# Patient Record
Sex: Male | Born: 1955 | Race: White | Hispanic: No | Marital: Married | Smoking: Never smoker
Health system: Southern US, Community
[De-identification: ages and names within clinical notes are randomized; demographics above are authoritative.]

## PROBLEM LIST (undated history)

## (undated) DIAGNOSIS — R4182 Altered mental status, unspecified: Secondary | ICD-10-CM

## (undated) DIAGNOSIS — R931 Abnormal findings on diagnostic imaging of heart and coronary circulation: Secondary | ICD-10-CM

## (undated) DIAGNOSIS — M5136 Other intervertebral disc degeneration, lumbar region: Secondary | ICD-10-CM

## (undated) DIAGNOSIS — Z79899 Other long term (current) drug therapy: Secondary | ICD-10-CM

## (undated) DIAGNOSIS — E782 Mixed hyperlipidemia: Secondary | ICD-10-CM

## (undated) DIAGNOSIS — F330 Major depressive disorder, recurrent, mild: Secondary | ICD-10-CM

## (undated) DIAGNOSIS — R251 Tremor, unspecified: Secondary | ICD-10-CM

## (undated) DIAGNOSIS — I4891 Unspecified atrial fibrillation: Secondary | ICD-10-CM

## (undated) DIAGNOSIS — S4991XA Unspecified injury of right shoulder and upper arm, initial encounter: Secondary | ICD-10-CM

## (undated) DIAGNOSIS — M75101 Unspecified rotator cuff tear or rupture of right shoulder, not specified as traumatic: Secondary | ICD-10-CM

## (undated) DIAGNOSIS — R42 Dizziness and giddiness: Secondary | ICD-10-CM

## (undated) DIAGNOSIS — H9190 Unspecified hearing loss, unspecified ear: Secondary | ICD-10-CM

## (undated) DIAGNOSIS — E538 Deficiency of other specified B group vitamins: Secondary | ICD-10-CM

## (undated) DIAGNOSIS — R9082 White matter disease, unspecified: Secondary | ICD-10-CM

## (undated) DIAGNOSIS — I429 Cardiomyopathy, unspecified: Secondary | ICD-10-CM

## (undated) DIAGNOSIS — R7303 Prediabetes: Secondary | ICD-10-CM

## (undated) DIAGNOSIS — Z7982 Long term (current) use of aspirin: Secondary | ICD-10-CM

## (undated) DIAGNOSIS — R5381 Other malaise: Secondary | ICD-10-CM

## (undated) HISTORY — DX: Abnormal findings on diagnostic imaging of heart and coronary circulation: R93.1

## (undated) HISTORY — PX: BACK SURGERY: SHX140

## (undated) HISTORY — PX: CHOLECYSTECTOMY: SHX55

---
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
## 1996-06-06 HISTORY — PX: SPINAL FUSION: SHX223

## 1999-06-07 HISTORY — PX: SPINE SURGERY: SHX786

## 1999-12-01 ENCOUNTER — Ambulatory Visit (HOSPITAL_COMMUNITY): Admission: RE | Admit: 1999-12-01 | Discharge: 1999-12-01 | Payer: Self-pay | Admitting: Specialist

## 1999-12-01 ENCOUNTER — Encounter: Payer: Self-pay | Admitting: Specialist

## 2007-06-07 HISTORY — PX: HERNIA REPAIR: SHX51

## 2010-12-05 HISTORY — PX: SHOULDER SURGERY: SHX246

## 2014-11-04 HISTORY — PX: ESOPHAGOGASTRODUODENOSCOPY: SHX1529

## 2015-07-30 DIAGNOSIS — M5136 Other intervertebral disc degeneration, lumbar region: Secondary | ICD-10-CM

## 2015-07-30 DIAGNOSIS — M51369 Other intervertebral disc degeneration, lumbar region without mention of lumbar back pain or lower extremity pain: Secondary | ICD-10-CM

## 2015-07-30 DIAGNOSIS — Z79899 Other long term (current) drug therapy: Secondary | ICD-10-CM

## 2015-07-30 DIAGNOSIS — E538 Deficiency of other specified B group vitamins: Secondary | ICD-10-CM

## 2015-07-30 DIAGNOSIS — F330 Major depressive disorder, recurrent, mild: Secondary | ICD-10-CM

## 2015-07-30 DIAGNOSIS — R251 Tremor, unspecified: Secondary | ICD-10-CM

## 2015-07-30 DIAGNOSIS — R7303 Prediabetes: Secondary | ICD-10-CM

## 2015-07-30 DIAGNOSIS — E785 Hyperlipidemia, unspecified: Secondary | ICD-10-CM

## 2015-07-30 DIAGNOSIS — E782 Mixed hyperlipidemia: Secondary | ICD-10-CM

## 2015-07-30 HISTORY — DX: Other intervertebral disc degeneration, lumbar region without mention of lumbar back pain or lower extremity pain: M51.369

## 2015-07-30 HISTORY — DX: Other long term (current) drug therapy: Z79.899

## 2015-07-30 HISTORY — DX: Major depressive disorder, recurrent, mild: F33.0

## 2015-07-30 HISTORY — DX: Prediabetes: R73.03

## 2015-07-30 HISTORY — DX: Deficiency of other specified B group vitamins: E53.8

## 2015-07-30 HISTORY — DX: Other intervertebral disc degeneration, lumbar region: M51.36

## 2015-07-30 HISTORY — DX: Mixed hyperlipidemia: E78.2

## 2015-07-30 HISTORY — DX: Hyperlipidemia, unspecified: E78.5

## 2015-07-30 HISTORY — DX: Tremor, unspecified: R25.1

## 2016-05-26 HISTORY — PX: COLONOSCOPY: SHX174

## 2018-10-08 DIAGNOSIS — I4821 Permanent atrial fibrillation: Secondary | ICD-10-CM

## 2018-10-08 DIAGNOSIS — S4991XA Unspecified injury of right shoulder and upper arm, initial encounter: Secondary | ICD-10-CM

## 2018-10-08 DIAGNOSIS — I4891 Unspecified atrial fibrillation: Secondary | ICD-10-CM

## 2018-10-08 HISTORY — DX: Unspecified atrial fibrillation: I48.91

## 2018-10-08 HISTORY — DX: Unspecified injury of right shoulder and upper arm, initial encounter: S49.91XA

## 2018-10-08 HISTORY — DX: Permanent atrial fibrillation: I48.21

## 2018-10-15 DIAGNOSIS — Z7982 Long term (current) use of aspirin: Secondary | ICD-10-CM | POA: Insufficient documentation

## 2018-10-15 DIAGNOSIS — H9190 Unspecified hearing loss, unspecified ear: Secondary | ICD-10-CM

## 2018-10-15 HISTORY — DX: Unspecified hearing loss, unspecified ear: H91.90

## 2018-10-15 HISTORY — DX: Long term (current) use of aspirin: Z79.82

## 2018-10-16 DIAGNOSIS — R0600 Dyspnea, unspecified: Secondary | ICD-10-CM

## 2018-10-16 DIAGNOSIS — R0609 Other forms of dyspnea: Secondary | ICD-10-CM

## 2018-10-16 HISTORY — DX: Other forms of dyspnea: R06.09

## 2018-10-16 HISTORY — DX: Dyspnea, unspecified: R06.00

## 2018-10-25 DIAGNOSIS — R4182 Altered mental status, unspecified: Secondary | ICD-10-CM

## 2018-10-25 DIAGNOSIS — R5381 Other malaise: Secondary | ICD-10-CM | POA: Insufficient documentation

## 2018-10-25 DIAGNOSIS — I429 Cardiomyopathy, unspecified: Secondary | ICD-10-CM | POA: Insufficient documentation

## 2018-10-25 DIAGNOSIS — R5383 Other fatigue: Secondary | ICD-10-CM

## 2018-10-25 DIAGNOSIS — R55 Syncope and collapse: Secondary | ICD-10-CM | POA: Insufficient documentation

## 2018-10-25 DIAGNOSIS — M75101 Unspecified rotator cuff tear or rupture of right shoulder, not specified as traumatic: Secondary | ICD-10-CM

## 2018-10-25 DIAGNOSIS — R42 Dizziness and giddiness: Secondary | ICD-10-CM | POA: Insufficient documentation

## 2018-10-25 HISTORY — DX: Unspecified rotator cuff tear or rupture of right shoulder, not specified as traumatic: M75.101

## 2018-10-25 HISTORY — DX: Cardiomyopathy, unspecified: I42.9

## 2018-10-25 HISTORY — DX: Syncope and collapse: R42

## 2018-10-25 HISTORY — DX: Other malaise: R53.83

## 2018-10-25 HISTORY — DX: Altered mental status, unspecified: R41.82

## 2018-10-25 HISTORY — DX: Other malaise: R53.81

## 2018-11-19 DIAGNOSIS — R9082 White matter disease, unspecified: Secondary | ICD-10-CM

## 2018-11-19 HISTORY — DX: White matter disease, unspecified: R90.82

## 2018-11-22 ENCOUNTER — Encounter: Payer: Self-pay | Admitting: *Deleted

## 2018-11-22 ENCOUNTER — Other Ambulatory Visit: Payer: Self-pay | Admitting: *Deleted

## 2018-11-23 ENCOUNTER — Other Ambulatory Visit: Payer: Self-pay

## 2018-11-23 ENCOUNTER — Ambulatory Visit (INDEPENDENT_AMBULATORY_CARE_PROVIDER_SITE_OTHER): Payer: Medicare HMO | Admitting: Cardiology

## 2018-11-23 ENCOUNTER — Encounter: Payer: Self-pay | Admitting: Cardiology

## 2018-11-23 VITALS — BP 110/68 | HR 70 | Ht 72.0 in | Wt 215.6 lb

## 2018-11-23 DIAGNOSIS — E782 Mixed hyperlipidemia: Secondary | ICD-10-CM

## 2018-11-23 DIAGNOSIS — I4891 Unspecified atrial fibrillation: Secondary | ICD-10-CM

## 2018-11-23 DIAGNOSIS — R7303 Prediabetes: Secondary | ICD-10-CM | POA: Diagnosis not present

## 2018-11-23 DIAGNOSIS — I420 Dilated cardiomyopathy: Secondary | ICD-10-CM | POA: Diagnosis not present

## 2018-11-23 NOTE — Progress Notes (Signed)
Cardiology Consultation:    Date:  11/23/2018   ID:  Richard Spence, DOB Oct 08, 1955, MRN [id number]  PCP:  [name], MD  Cardiologist:  [name], MD   Referring MD: No ref. provider found   Chief Complaint  Patient presents with  . Atrial Fibrillation  I have atrial fibrillation  History of Present Illness:    Richard Spence is a 63 y.o. male who is being seen today for the evaluation of atrial fibrillation at the request of No ref. provider found.  Few weeks ago he went to his primary care physician for regular physical.  He was found to have irregular heart rate EKG was done and confirmed presence of atrial fibrillation.  Patient is completely unaware of this phenomenon.  He does complain of having some shortness of breath and fatigue but overall seems to be doing well.  He does have shoulder injury and he wants to have surgery on the shoulder.  He comes to me for second opinion about what to do with the situation.  He can walk climb stairs with no difficulties.  Never had any heart issues.  Does have some family history of atrial fibrillation. Echocardiogram done about a month ago showed ejection fraction 40%.  He also got stress test done and stress test showed no evidence of ischemia with ejection fraction 20 to 30%.  On top of that echocardiogram has some conflicting information about left atrial size there is a statement about severe left atrium enlargement and at the same time of volume is within mild range.  Past Medical History:  Diagnosis Date  . Altered mental status 10/25/2018  . Atrial fibrillation with rapid ventricular response (HCC) 10/08/2018  . Cardiomyopathy (HCC) 10/25/2018   EF 25-30% on ST. 40% on ECHO 2020.  LVH  . Degeneration of lumbar intervertebral disc 07/30/2015  . DOE (dyspnea on exertion) 10/16/2018  . Hearing loss 10/15/2018  . High risk medication use 07/30/2015  . Long-term use of aspirin therapy 10/15/2018  . Malaise and fatigue 10/25/2018   . 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   Last Assessment & Plan:  Relevant Hx: Course: Daily Update: Today's Plan:will refill his med for him today and he is stable with this  Electronically signed by: [name], FNP 07/30/15 [phone]  . Mixed hyperlipidemia 07/30/2015  . Postural dizziness with presyncope 10/25/2018  . Prediabetes 07/30/2015  . Right shoulder injury 10/08/2018  . Tear of right supraspinatus tendon 10/25/2018  . Tremor 07/30/2015  . Vitamin B 12 deficiency 07/30/2015  . White matter disease, unspecified 11/19/2018   Noted on MRI of brain 2020. Discussed with pt and wife.      Current Medications: Current Meds  Medication Sig  . apixaban (ELIQUIS) 5 MG TABS tablet Take by mouth 2 (two) times daily.   [name] aspirin EC 325 MG tablet Take by mouth daily. Take 1 tablet (325 mg total) by mouth daily  . carvedilol (COREG) 12.5 MG tablet Take by mouth 2 (two) times a day. Take 1 tablet (12.5 mg total) by mouth 2 times daily with meals  . Cyanocobalamin (VITAMIN B12 PO) Take by mouth daily.  [name] diltiazem (CARDIZEM CD) 120 MG 24 hr capsule Take by mouth daily.  [name] PARoxetine (PAXIL) 40 MG tablet Take by mouth every morning.     Allergies:   Patient has no known allergies.   Social History   Socioeconomic History  . Marital status: Married  Spouse name: Not on file  . Number of children: Not on file  . Years of education: Not on file  . Highest education level: Not on file  Occupational History  . Not on file  Social Needs  . Financial resource strain: Not on file  . Food insecurity    Worry: Not on file    Inability: Not on file  . Transportation needs    Medical: Not on file    Non-medical: Not on file  Tobacco Use  . Smoking status: Never Smoker  . Smokeless tobacco: Never Used  Substance and Sexual Activity  . Alcohol use: Yes    Comment: once in a while  . Drug use: Not Currently  . Sexual activity: Not on file  Lifestyle   . Physical activity    Days per week: Not on file    Minutes per session: Not on file  . Stress: Not on file  Relationships  . Social Herbalist on phone: Not on file    Gets together: Not on file    Attends religious service: Not on file    Active member of club or organization: Not on file    Attends meetings of clubs or organizations: Not on file    Relationship status: Not on file  Other Topics Concern  . Not on file  Social History Narrative  . Not on file     Family History: The patient's family history includes Breast cancer in his mother; Dementia in his maternal grandmother and mother; Hypertension in his father and mother; Multiple sclerosis in his son; Prostate cancer in his maternal grandfather. ROS:   Please see the history of present illness.    All 14 point review of systems negative except as described per history of present illness.  EKGs/Labs/Other Studies Reviewed:    The following studies were reviewed today:   EKG:  EKG is  ordered today.  The ekg ordered today demonstrates atrial fibrillation with controlled ventricular rate 81, poor R wave progression anterior precordium,  Recent Labs: No results found for requested labs within last 8760 hours.  Recent Lipid Panel No results found for: CHOL, TRIG, HDL, CHOLHDL, VLDL, LDLCALC, LDLDIRECT  Physical Exam:    VS:  BP 110/68   Pulse 70   Ht 6' (1.829 m)   Wt 215 lb 9.6 oz (97.8 kg)   SpO2 98%   BMI 29.24 kg/m     Wt Readings from Last 3 Encounters:  11/23/18 215 lb 9.6 oz (97.8 kg)  11/19/18 213 lb (96.6 kg)     GEN:  Well nourished, well developed in no acute distress HEENT: Normal NECK: No JVD; No carotid bruits LYMPHATICS: No lymphadenopathy CARDIAC: Irregularly irregular, no murmurs, no rubs, no gallops RESPIRATORY:  Clear to auscultation without rales, wheezing or rhonchi  ABDOMEN: Soft, non-tender, non-distended MUSCULOSKELETAL:  No edema; No deformity  SKIN: Warm and  dry NEUROLOGIC:  Alert and oriented x 3 PSYCHIATRIC:  Normal affect   ASSESSMENT:    1. Atrial fibrillation with rapid ventricular response ([place])   2. Dilated cardiomyopathy ([place])   3. Mixed hyperlipidemia   4. Prediabetes    PLAN:    In order of problems listed above:  1. Atrial fibrillation with rapid ventricular response now rate control with beta-blocker as well as calcium channel blocker.  He is also anticoagulated.  His chads 2 Vascor equals 1 for cardiomyopathy.  We talked in length about options in this situation I  told him that simply keeping his weight under control versus trying to attempt to bring his heart back to normal rhythm with cardioversion, we talked about potential antiarrhythmic therapy as well as potential atrial fibrillation ablation.  His main goal is to make sure he does have his shoulder surgery soon.  Therefore I decided to repeat his echocardiogram.  It is possible that his echocardiogram showed diminished left ventricular ejection fraction because of atrial fibrillation with fast ventricular rate it is also possible that he does have tachycardia induced cardiomyopathy.  Echocardiogram will help to determine if his ejection fraction improved since his ventricular rate is controlled now.  If this is the case I think we can proceed with surgery even if he still in atrial fibrillation.  Additional benefits of echocardiogram will be to assess left atrial size.  In the previous report of some conflicting information about size of the left atrium left atrium is to be significantly enlarged but of course diminished chance of succeeding getting his heart back to normal rhythm and keeping his heart in normal rhythm.  In the meantime we will continue present medications I will see him very quickly after his echocardiogram will be done I will decide what to do in this situation.  If he will required shoulder surgery it will be acceptable to hold his anticoagulation for 2 days.  His  chads 2 Vascor is only 1. 2. Dilated cardiomyopathy hopefully it is related only tachycardia and hopefully it improved with proper management that being already instituted. 3. Prediabetes has been followed by anti-medicine team 4. Dyslipidemia we will continue monitoring   Medication Adjustments/Labs and Tests Ordered: Current medicines are reviewed at length with the patient today.  Concerns regarding medicines are outlined above.  Orders Placed This Encounter  Procedures  . ECHOCARDIOGRAM COMPLETE   No orders of the defined types were placed in this encounter.   Signed, Georgeanna Leaobert J. Zoria Rawlinson, MD, [facility], [facility]orporated[facility]ACC. 11/23/2018 5:20 PM    [facility]

## 2018-11-23 NOTE — Patient Instructions (Signed)
Medication Instructions:  Your physician recommends that you continue on your current medications as directed. Please refer to the Current Medication list given to you today.  If you need a refill on your cardiac medications before your next appointment, please call your pharmacy.   Lab work: None.  If you have labs (blood work) drawn today and your tests are completely normal, you will receive your results only by: . MyChart Message (if you have MyChart) OR . A paper copy in the mail If you have any lab test that is abnormal or we need to change your treatment, we will call you to review the results.  Testing/Procedures: Your physician has requested that you have an echocardiogram. Echocardiography is a painless test that uses sound waves to create images of your heart. It provides your doctor with information about the size and shape of your heart and how well your heart's chambers and valves are working. This procedure takes approximately one hour. There are no restrictions for this procedure.    Follow-Up: At [facility], you and your health needs are our priority.  As part of our continuing mission to provide you with exceptional heart care, we have created designated Provider Care Teams.  These Care Teams include your primary Cardiologist (physician) and Advanced Practice Providers (APPs -  Physician Assistants and Nurse Practitioners) who all work together to provide you with the care you need, when you need it. You will need a follow up appointment in 2 weeks.  Please call our office 2 months in advance to schedule this appointment.  You may see No primary care provider on file. or another member of our [facility] Provider Team in [place]: Brian Munley, MD . Rajan Revankar, MD  Any Other Special Instructions Will Be Listed Below (If Applicable).   Echocardiogram An echocardiogram is a procedure that uses painless sound waves (ultrasound) to produce an image of the heart.  Images from an echocardiogram can provide important information about:  Signs of coronary artery disease (CAD).  Aneurysm detection. An aneurysm is a weak or damaged part of an artery wall that bulges out from the normal force of blood pumping through the body.  Heart size and shape. Changes in the size or shape of the heart can be associated with certain conditions, including heart failure, aneurysm, and CAD.  Heart muscle function.  Heart valve function.  Signs of a past heart attack.  Fluid buildup around the heart.  Thickening of the heart muscle.  A tumor or infectious growth around the heart valves. Tell a health care provider about:  Any allergies you have.  All medicines you are taking, including vitamins, herbs, eye drops, creams, and over-the-counter medicines.  Any blood disorders you have.  Any surgeries you have had.  Any medical conditions you have.  Whether you are pregnant or may be pregnant. What are the risks? Generally, this is a safe procedure. However, problems may occur, including:  Allergic reaction to dye (contrast) that may be used during the procedure. What happens before the procedure? No specific preparation is needed. You may eat and drink normally. What happens during the procedure?   An IV tube may be inserted into one of your veins.  You may receive contrast through this tube. A contrast is an injection that improves the quality of the pictures from your heart.  A gel will be applied to your chest.  A wand-like tool (transducer) will be moved over your chest. The gel will help to   transmit the sound waves from the transducer.  The sound waves will harmlessly bounce off of your heart to allow the heart images to be captured in real-time motion. The images will be recorded on a computer. The procedure may vary among health care providers and hospitals. What happens after the procedure?  You may return to your normal, everyday life,  including diet, activities, and medicines, unless your health care provider tells you not to do that. Summary  An echocardiogram is a procedure that uses painless sound waves (ultrasound) to produce an image of the heart.  Images from an echocardiogram can provide important information about the size and shape of your heart, heart muscle function, heart valve function, and fluid buildup around your heart.  You do not need to do anything to prepare before this procedure. You may eat and drink normally.  After the echocardiogram is completed, you may return to your normal, everyday life, unless your health care provider tells you not to do that. This information is not intended to replace advice given to you by your health care provider. Make sure you discuss any questions you have with your health care provider. Document Released: 05/20/2000 Document Revised: 06/25/2016 Document Reviewed: 06/25/2016 Elsevier Interactive Patient Education  2019 Elsevier Inc.    

## 2018-11-26 NOTE — Addendum Note (Signed)
Addended by: Ashok Norris on: 11/26/2018 08:35 AM   Modules accepted: Orders

## 2018-11-28 DIAGNOSIS — I517 Cardiomegaly: Secondary | ICD-10-CM | POA: Diagnosis not present

## 2018-12-06 ENCOUNTER — Encounter: Payer: Self-pay | Admitting: Cardiology

## 2018-12-06 ENCOUNTER — Other Ambulatory Visit: Payer: Self-pay

## 2018-12-06 ENCOUNTER — Telehealth (INDEPENDENT_AMBULATORY_CARE_PROVIDER_SITE_OTHER): Payer: Medicare HMO | Admitting: Cardiology

## 2018-12-06 ENCOUNTER — Telehealth: Payer: Medicare HMO | Admitting: Cardiology

## 2018-12-06 VITALS — BP 120/80 | Wt 215.0 lb

## 2018-12-06 DIAGNOSIS — I420 Dilated cardiomyopathy: Secondary | ICD-10-CM | POA: Diagnosis not present

## 2018-12-06 DIAGNOSIS — I4891 Unspecified atrial fibrillation: Secondary | ICD-10-CM

## 2018-12-06 DIAGNOSIS — S4991XS Unspecified injury of right shoulder and upper arm, sequela: Secondary | ICD-10-CM

## 2018-12-06 DIAGNOSIS — R0609 Other forms of dyspnea: Secondary | ICD-10-CM

## 2018-12-06 NOTE — Progress Notes (Signed)
Virtual Visit via Telephone Note   This visit type was conducted due to national recommendations for restrictions regarding the COVID-19 Pandemic (e.g. social distancing) in an effort to limit this patient's exposure and mitigate transmission in our community.  Due to his co-morbid illnesses, this patient is at least at moderate risk for complications without adequate follow up.  This format is felt to be most appropriate for this patient at this time.  The patient did not have access to video technology/had technical difficulties with video requiring transitioning to audio format only (telephone).  All issues noted in this document were discussed and addressed.  No physical exam could be performed with this format.  Please refer to the patient's chart for his  consent to telehealth for [facility].  Evaluation Performed:  Follow-up visit  This visit type was conducted due to national recommendations for restrictions regarding the COVID-19 Pandemic (e.g. social distancing).  This format is felt to be most appropriate for this patient at this time.  All issues noted in this document were discussed and addressed.  No physical exam was performed (except for noted visual exam findings with Video Visits).  Please refer to the patient's chart (MyChart message for video visits and phone note for telephone visits) for the patient's consent to telehealth for [facility].  Date:  12/06/2018  ID: Richard Spence, DOB Oct 10, 1955, MRN [id number]   Patient Location: [place] [address]   Provider location:   [place] Office  PCP:  [name]., MD  Cardiologist:  [name], MD     Chief Complaint: I am doing fine but my shoulder is bothering me  History of Present Illness:    Richard Spence is a 63 y.o. male  who presents via audio/video conferencing for a telehealth visit today.  He was discovered to have atrial fibrillation with fast  ventricular rate and diminished left echocardiogram showed ejection fraction 40%.Overall cardiac wise he is doing well denies having any chest pain tightness squeezing pressure burning chest.  He suffer a lot because of his shoulder injury and he needs surgery for it.  I think the key right now is to retrieve his repeat his echocardiogram to check his left ventricular ejection fraction.  We may be forced to do ischemia work-up if ejection fraction did not improve we may be forced to augment his medical therapy if his ejection fraction did not improve.   The patient does not have symptoms concerning for COVID-19 infection (fever, chills, cough, or new SHORTNESS OF BREATH).    Prior CV studies:   The following studies were reviewed today:       Past Medical History:  Diagnosis Date  . Altered mental status 10/25/2018  . Atrial fibrillation with rapid ventricular response ([place]) 10/08/2018  . Cardiomyopathy ([place]) 10/25/2018   EF 25-30% on ST. 40% on ECHO 2020.  LVH  . Degeneration of lumbar intervertebral disc 07/30/2015  . DOE (dyspnea on exertion) 10/16/2018  . Hearing loss 10/15/2018  . High risk medication use 07/30/2015  . Long-term use of aspirin therapy 10/15/2018  . Malaise and fatigue 10/25/2018  . Mild episode of recurrent major depressive disorder ([place]) 07/30/2015   Last Assessment & Plan:  Relevant Hx: Course: Daily Update: Today's Plan:will refill his med for him today and he is stable with this  Electronically signed by: Baldemar Friday, [place] 07/30/15 [phone]  . Mixed hyperlipidemia 07/30/2015  . Postural dizziness with presyncope  10/25/2018  . Prediabetes 07/30/2015  . Right shoulder injury 10/08/2018  . Tear of right supraspinatus tendon 10/25/2018  . Tremor 07/30/2015  . Vitamin B 12 deficiency 07/30/2015  . White matter disease, unspecified 11/19/2018   Noted on MRI of brain 2020. Discussed with pt and wife.       Current Meds  Medication Sig  . apixaban (ELIQUIS) 5 MG TABS  tablet Take by mouth 2 (two) times daily.   [name]. aspirin EC 325 MG tablet Take by mouth daily. Take 1 tablet (325 mg total) by mouth daily  . carvedilol (COREG) 12.5 MG tablet Take by mouth 2 (two) times a day. Take 1 tablet (12.5 mg total) by mouth 2 times daily with meals  . Cyanocobalamin (VITAMIN B12 PO) Take by mouth daily.  [name]. diltiazem (CARDIZEM CD) 120 MG 24 hr capsule Take by mouth daily.  [name]. PARoxetine (PAXIL) 40 MG tablet Take by mouth every morning.      Family History: The patient's family history includes Breast cancer in his mother; Dementia in his maternal grandmother and mother; Hypertension in his father and mother; Multiple sclerosis in his son; Prostate cancer in his maternal grandfather.   ROS:   Please see the history of present illness.     All other systems reviewed and are negative.   Labs/Other Tests and Data Reviewed:     Recent Labs: No results found for requested labs within last 8760 hours.  Recent Lipid Panel No results found for: CHOL, TRIG, HDL, CHOLHDL, VLDL, LDLCALC, LDLDIRECT    Exam:    Vital Signs:  BP 120/80   Wt 215 lb (97.5 kg)   BMI 29.16 kg/m     Wt Readings from Last 3 Encounters:  12/06/18 215 lb (97.5 kg)  11/23/18 215 lb 9.6 oz (97.8 kg)  11/19/18 213 lb (96.6 kg)     Well nourished, well developed in no acute distress. Alert oriented x3 doing well asymptomatic during the time of my interview.  He does not have technical ability to establish video link therefore we took over the phone  Diagnosis for this visit:   1. Dilated cardiomyopathy (HCC)   2. Atrial fibrillation with rapid ventricular response (HCC)   3. DOE (dyspnea on exertion)   4. Injury of right shoulder, sequela      ASSESSMENT & PLAN:    1.  Cardiomyopathy awaiting results of repeat of echocardiogram. Atrial fibrillation anticoagulated which I will continue rate appears to be controlled. Shoulder injury he will required surgery awaiting echocardiogram to  make a decision about future management.  COVID-19 Education: The signs and symptoms of COVID-19 were discussed with the patient and how to seek care for testing (follow up with PCP or arrange E-visit).  The importance of social distancing was discussed today.  Patient Risk:   After full review of this patients clinical status, I feel that they are at least moderate risk at this time.  Time:   Today, I have spent 15 minutes with the patient with telehealth technology discussing pt health issues.  I spent 5 minutes reviewing her chart before the visit.  Visit was finished at 12:05 PM.    Medication Adjustments/Labs and Tests Ordered: Current medicines are reviewed at length with the patient today.  Concerns regarding medicines are outlined above.  No orders of the defined types were placed in this encounter.  Medication changes: No orders of the defined types were placed in this encounter.    Disposition: Follow-up 1  month  Signed, Georgeanna Lea, MD, [facility], [facility], [facility] 12/06/2018 12:37 PM    [facility]

## 2018-12-10 DIAGNOSIS — G3184 Mild cognitive impairment, so stated: Secondary | ICD-10-CM

## 2018-12-10 HISTORY — DX: Mild cognitive impairment of uncertain or unknown etiology: G31.84

## 2018-12-11 ENCOUNTER — Telehealth: Payer: Self-pay | Admitting: Cardiology

## 2018-12-11 NOTE — Telephone Encounter (Signed)
This is the patient we were talking about you were going to check on with Dr Raliegh Ip

## 2018-12-12 NOTE — Telephone Encounter (Signed)
Spoke with patient's wife, and verified that he had been his Eliquis for at least 4 weeks and that he has not missed a does. I advised that she make sure he dose not miss any. I let her know that I would arrange for his Cardioversion be schedule on a day that Dr. [name] would be rounding at the hospital and that I would be calling back with instructions.

## 2018-12-13 NOTE — Telephone Encounter (Signed)
Patient's wife called back. I let her know date and time and went over instructions with her. She is aware he is to be NPO midnight and to make sure he has his Eliquis in the morning before the procedure.

## 2018-12-13 NOTE — Telephone Encounter (Signed)
Left voicemail to have Richard Spence return phone call

## 2018-12-14 NOTE — Telephone Encounter (Signed)
Richard Spence that the patient is still on for 12/17/2018 at 9:00 am

## 2018-12-17 ENCOUNTER — Encounter: Payer: Self-pay | Admitting: Physician Assistant

## 2018-12-17 DIAGNOSIS — I4891 Unspecified atrial fibrillation: Secondary | ICD-10-CM | POA: Diagnosis not present

## 2019-01-03 ENCOUNTER — Telehealth: Payer: Self-pay | Admitting: Cardiology

## 2019-01-03 NOTE — Telephone Encounter (Signed)
Patient needs hospital f/up per [place]. Patient in [place] hospital last week and needs a f/up

## 2019-01-03 NOTE — Telephone Encounter (Signed)
Called pt to see if he could see another provider since Dr. Raliegh Ip is totally booked in [place] for month of August. No voicemail and no answer.

## 2019-01-07 NOTE — Telephone Encounter (Signed)
Left message for patient to return call.

## 2019-01-08 NOTE — Telephone Encounter (Signed)
Patient called back. Richard Spence will schedule.

## 2019-01-21 ENCOUNTER — Ambulatory Visit (HOSPITAL_COMMUNITY)
Admission: RE | Admit: 2019-01-21 | Discharge: 2019-01-21 | Disposition: A | Discharge status: Home / Self Care | Payer: Medicare HMO | Source: Ambulatory Visit | Attending: Cardiology | Admitting: Cardiology

## 2019-01-21 ENCOUNTER — Encounter: Payer: Self-pay | Admitting: Cardiology

## 2019-01-21 ENCOUNTER — Other Ambulatory Visit: Payer: Self-pay

## 2019-01-21 ENCOUNTER — Ambulatory Visit (INDEPENDENT_AMBULATORY_CARE_PROVIDER_SITE_OTHER): Payer: Medicare HMO | Admitting: Cardiology

## 2019-01-21 VITALS — BP 120/70 | HR 89 | Ht 72.0 in | Wt 217.0 lb

## 2019-01-21 DIAGNOSIS — E785 Hyperlipidemia, unspecified: Secondary | ICD-10-CM | POA: Diagnosis not present

## 2019-01-21 DIAGNOSIS — I420 Dilated cardiomyopathy: Secondary | ICD-10-CM

## 2019-01-21 DIAGNOSIS — E782 Mixed hyperlipidemia: Secondary | ICD-10-CM

## 2019-01-21 DIAGNOSIS — I480 Paroxysmal atrial fibrillation: Secondary | ICD-10-CM | POA: Insufficient documentation

## 2019-01-21 DIAGNOSIS — I4819 Other persistent atrial fibrillation: Secondary | ICD-10-CM | POA: Insufficient documentation

## 2019-01-21 HISTORY — DX: Other persistent atrial fibrillation: I48.19

## 2019-01-21 LAB — ECHOCARDIOGRAM COMPLETE
Height: 72 in
Weight: 3472 oz

## 2019-01-21 NOTE — Patient Instructions (Addendum)
Medication Instructions:  Your physician has recommended you make the following change in your medication:   STOP ASPIRIN    If you need a refill on your cardiac medications before your next appointment, please call your pharmacy.   Lab work: Your physician recommends that you return for lab work today: BMP  If you have labs (blood work) drawn today and your tests are completely normal, you will receive your results only by: [name] MyChart Message (if you have MyChart) OR . A paper copy in the mail If you have any lab test that is abnormal or we need to change your treatment, we will call you to review the results.  Testing/Procedures: You have been referred to Dr. Elberta Fortis, Electrophysiologist  Your physician has requested that you have an echocardiogram. Echocardiography is a painless test that uses sound waves to create images of your heart. It provides your doctor with information about the size and shape of your heart and how well your heart's chambers and valves are working. This procedure takes approximately one hour. There are no restrictions for this procedure.    Follow-Up: At [facility], you and your health needs are our priority.  As part of our continuing mission to provide you with exceptional heart care, we have created designated Provider Care Teams.  These Care Teams include your primary Cardiologist (physician) and Advanced Practice Providers (APPs -  Physician Assistants and Nurse Practitioners) who all work together to provide you with the care you need, when you need it. . You will need a follow up appointment in 1 months.   Any Other Special Instructions Will Be Listed Below (If Applicable).   Echocardiogram An echocardiogram is a procedure that uses painless sound waves (ultrasound) to produce an image of the heart. Images from an echocardiogram can provide important information about:  Signs of coronary artery disease (CAD).  Aneurysm detection. An aneurysm is  a weak or damaged part of an artery wall that bulges out from the normal force of blood pumping through the body.  Heart size and shape. Changes in the size or shape of the heart can be associated with certain conditions, including heart failure, aneurysm, and CAD.  Heart muscle function.  Heart valve function.  Signs of a past heart attack.  Fluid buildup around the heart.  Thickening of the heart muscle.  A tumor or infectious growth around the heart valves. Tell a health care provider about:  Any allergies you have.  All medicines you are taking, including vitamins, herbs, eye drops, creams, and over-the-counter medicines.  Any blood disorders you have.  Any surgeries you have had.  Any medical conditions you have.  Whether you are pregnant or may be pregnant. What are the risks? Generally, this is a safe procedure. However, problems may occur, including:  Allergic reaction to dye (contrast) that may be used during the procedure. What happens before the procedure? No specific preparation is needed. You may eat and drink normally. What happens during the procedure?   An IV tube may be inserted into one of your veins.  You may receive contrast through this tube. A contrast is an injection that improves the quality of the pictures from your heart.  A gel will be applied to your chest.  A wand-like tool (transducer) will be moved over your chest. The gel will help to transmit the sound waves from the transducer.  The sound waves will harmlessly bounce off of your heart to allow the heart images to be  captured in real-time motion. The images will be recorded on a computer. The procedure may vary among health care providers and hospitals. What happens after the procedure?  You may return to your normal, everyday life, including diet, activities, and medicines, unless your health care provider tells you not to do that. Summary  An echocardiogram is a procedure that  uses painless sound waves (ultrasound) to produce an image of the heart.  Images from an echocardiogram can provide important information about the size and shape of your heart, heart muscle function, heart valve function, and fluid buildup around your heart.  You do not need to do anything to prepare before this procedure. You may eat and drink normally.  After the echocardiogram is completed, you may return to your normal, everyday life, unless your health care provider tells you not to do that. This information is not intended to replace advice given to you by your health care provider. Make sure you discuss any questions you have with your health care provider. Document Released: 05/20/2000 Document Revised: 09/13/2018 Document Reviewed: 06/25/2016 Elsevier Patient Education  2020 [employer].

## 2019-01-21 NOTE — Progress Notes (Signed)
  Echocardiogram 2D Echocardiogram has been performed.  Richard Spence Richard Spence 01/21/2019, 2:02 PM

## 2019-01-21 NOTE — Progress Notes (Signed)
Cardiology Office Note:    Date:  01/21/2019   ID:  Richard SellerMark D Spicer, DOB [birth year], MRN [id number]  PCP:  Gordan PaymentGrisso, Greg A., MD  Cardiologist:  Gypsy Balsamobert , MD    Referring MD: Gordan PaymentGrisso, Greg A., MD   Chief Complaint  Patient presents with  . Follow-up  Atrial fibrillation  History of Present Illness:    Richard Spence is a 63 y.o. male with paroxysmal atrial fibrillation now persistent atrial fibrillation.  We attempted cardioversion in July however he is back into atrial fibrillation with controlled ventricular rate.  Overall seems to be doing well he is wife however is with him and she said that he is not he complained of being weak tired exhausted.  He did not feel much better when he was in sinus rhythm..  Another complaint is the 5 that he gets rotary cuff tear that need to be repaired.  We talked about options in this situation I think we reached the point that antiarrhythmic medication is required.  The first step should be to get an echocardiogram which I will do.  Based on that we will decide if we can proceed with surgery.  I would proceed with surgery even if he is in atrial fibrillation.  I will also refer him to EP team with consideration for more advanced management of atrial fibrillation.  In the meantime we will continue with his anticoagulation.  Past Medical History:  Diagnosis Date  . Altered mental status 10/25/2018  . Atrial fibrillation with rapid ventricular response (HCC) 10/08/2018  . Cardiomyopathy (HCC) 10/25/2018   EF 25-30% on ST. 40% on ECHO 2020.  LVH  . Degeneration of lumbar intervertebral disc 07/30/2015  . DOE (dyspnea on exertion) 10/16/2018  . Hearing loss 10/15/2018  . High risk medication use 07/30/2015  . Long-term use of aspirin therapy 10/15/2018  . Malaise and fatigue 10/25/2018  . 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   Last Assessment & Plan:  Relevant Hx: Course: Daily Update: Today's Plan:will refill his med for  him today and he is stable with this  Electronically signed by: Jenelle MagesMartha Cazmariah Garner, FNP 07/30/15 [phone]  . Mixed hyperlipidemia 07/30/2015  . Postural dizziness with presyncope 10/25/2018  . Prediabetes 07/30/2015  . Right shoulder injury 10/08/2018  . Tear of right supraspinatus tendon 10/25/2018  . Tremor 07/30/2015  . Vitamin B 12 deficiency 07/30/2015  . White matter disease, unspecified 11/19/2018   Noted on MRI of brain 2020. Discussed with pt and wife.      Current Medications: Current Meds  Medication Sig  . apixaban (ELIQUIS) 5 MG TABS tablet Take by mouth 2 (two) times daily.   [name]. aspirin EC 325 MG tablet Take by mouth daily. Take 1 tablet (325 mg total) by mouth daily  . carvedilol (COREG) 12.5 MG tablet Take by mouth 2 (two) times a day. Take 1 tablet (12.5 mg total) by mouth 2 times daily with meals  . Cyanocobalamin (VITAMIN B12 PO) Take by mouth daily.  [name]. diltiazem (CARDIZEM CD) 120 MG 24 hr capsule Take by mouth daily.  [name]. PARoxetine (PAXIL) 40 MG tablet Take by mouth every morning.     Allergies:   Patient has no known allergies.   Social History   Socioeconomic History  . Marital status: Married    Spouse name: Not on file  . Number of children: Not on file  . Years of education: Not on file  . Highest education level: Not on  file  Occupational History  . Not on file  Social Needs  . Financial resource strain: Not on file  . Food insecurity    Worry: Not on file    Inability: Not on file  . Transportation needs    Medical: Not on file    Non-medical: Not on file  Tobacco Use  . Smoking status: Never Smoker  . Smokeless tobacco: Never Used  Substance and Sexual Activity  . Alcohol use: Yes    Comment: once in a while  . Drug use: Not Currently  . Sexual activity: Not on file  Lifestyle  . Physical activity    Days per week: Not on file    Minutes per session: Not on file  . Stress: Not on file  Relationships  . Social Musician on phone:  Not on file    Gets together: Not on file    Attends religious service: Not on file    Active member of club or organization: Not on file    Attends meetings of clubs or organizations: Not on file    Relationship status: Not on file  Other Topics Concern  . Not on file  Social History Narrative  . Not on file     Family History: The patient's family history includes Breast cancer in his mother; Dementia in his maternal grandmother and mother; Hypertension in his father and mother; Multiple sclerosis in his son; Prostate cancer in his maternal grandfather. ROS:   Please see the history of present illness.    All 14 point review of systems negative except as described per history of present illness  EKGs/Labs/Other Studies Reviewed:    EKG showed atrial fibrillation controlled ventricular rate, nonspecific ST-T segment changes  Recent Labs: No results found for requested labs within last 8760 hours.  Recent Lipid Panel No results found for: CHOL, TRIG, HDL, CHOLHDL, VLDL, LDLCALC, LDLDIRECT  Physical Exam:    VS:  BP 120/70   Pulse 89   Ht 6' (1.829 m)   Wt 217 lb (98.4 kg)   SpO2 98%   BMI 29.43 kg/m     Wt Readings from Last 3 Encounters:  01/21/19 217 lb (98.4 kg)  12/06/18 215 lb (97.5 kg)  11/23/18 215 lb 9.6 oz (97.8 kg)     GEN:  Well nourished, well developed in no acute distress HEENT: Normal NECK: No JVD; No carotid bruits LYMPHATICS: No lymphadenopathy CARDIAC: Irregular irregular, no murmurs, no rubs, no gallops RESPIRATORY:  Clear to auscultation without rales, wheezing or rhonchi  ABDOMEN: Soft, non-tender, non-distended MUSCULOSKELETAL:  No edema; No deformity  SKIN: Warm and dry LOWER EXTREMITIES: no swelling NEUROLOGIC:  Alert and oriented x 3 PSYCHIATRIC:  Normal affect   ASSESSMENT:    1. Dilated cardiomyopathy (HCC)   2. Persistent atrial fibrillation, status post cardioversion in July 2020 about flip back to atrial fibrillation a few weeks  later   3. Mixed hyperlipidemia    PLAN:    In order of problems listed above:  1. Dilated cardiomyopathy echocardiogram will be done.  He is on beta-blocker which I will continue.   2. Persistent atrial fibrillation.  Will do echocardiogram to decide about the choice of medication for his cardiomyopathy. 3. Mixed dyslipidemia.  Will continue diet alone.  I note that he is still taking full dose of aspirin asking to stop it.  Chem-7 will be done to decide about ARB   Medication Adjustments/Labs and Tests Ordered: Current medicines  are reviewed at length with the patient today.  Concerns regarding medicines are outlined above.  No orders of the defined types were placed in this encounter.  Medication changes: No orders of the defined types were placed in this encounter.   Signed, Park Liter, MD, [facility] 01/21/2019 10:06 AM    [facility]

## 2019-01-21 NOTE — Addendum Note (Signed)
Addended by: Polly Cobia A on: 01/21/2019 10:45 AM   Modules accepted: Orders

## 2019-01-22 LAB — BASIC METABOLIC PANEL
BUN/Creatinine Ratio: 20 (ref 10–24)
BUN: 24 mg/dL (ref 8–27)
CO2: 24 mmol/L (ref 20–29)
Calcium: 9.5 mg/dL (ref 8.6–10.2)
Chloride: 104 mmol/L (ref 96–106)
Creatinine, Ser: 1.18 mg/dL (ref 0.76–1.27)
GFR calc Af Amer: 75 mL/min/{1.73_m2} (ref >59)
GFR calc non Af Amer: 65 mL/min/{1.73_m2} (ref >59)
Glucose: 94 mg/dL (ref 65–99)
Potassium: 4.7 mmol/L (ref 3.5–5.2)
Sodium: 143 mmol/L (ref 134–144)

## 2019-01-23 ENCOUNTER — Telehealth: Payer: Self-pay | Admitting: Emergency Medicine

## 2019-01-23 DIAGNOSIS — I4891 Unspecified atrial fibrillation: Secondary | ICD-10-CM

## 2019-01-23 MED ORDER — LOSARTAN POTASSIUM 25 MG PO TABS: 25.0000 mg | ORAL_TABLET | Freq: Every day | ORAL | 1 refills | Status: DC

## 2019-01-23 NOTE — Telephone Encounter (Signed)
Patient's wife informed of results for labs and echo. Patients wife informed that patient needs to start losartan 25 mg and have bmp checked in 1 week. She verbally understood. No further questions.

## 2019-01-23 NOTE — Telephone Encounter (Signed)
Left message for patient to return call regarding results  

## 2019-01-25 ENCOUNTER — Telehealth: Payer: Self-pay | Admitting: Cardiology

## 2019-01-25 NOTE — Telephone Encounter (Signed)
Called patient wife and informed her that I will message Dr. Curt Bears team to see if we can move up appointment. She verbally understood. She reports patient remains dizzy as he has been. I asked her to call us of things get worse. She verbally understands. No further questions.

## 2019-01-25 NOTE — Telephone Encounter (Signed)
Please call patient wife, she states  He needs to get in sooner with [place] due to he is not feeling well at all and dizzy all the time.

## 2019-01-28 NOTE — Telephone Encounter (Signed)
Patient's wife informed to have patient hold losartan and call if this does not improve. She verbally understands. No further questions.

## 2019-01-28 NOTE — Telephone Encounter (Signed)
Stop losartan and see if any improvement

## 2019-01-28 NOTE — Telephone Encounter (Signed)
Patient's wife called and stated that since the patient has started Losartan he has gotten worse with the dizziness, and has been falling and unable to really move. I have advised the wife to have him check his blood pressure and his pulse. She will have him check both and call us back. I advised we will let Dr. [name] know.

## 2019-01-28 NOTE — Telephone Encounter (Signed)
130/93 is blood pressure and heart rate 87

## 2019-01-31 LAB — BASIC METABOLIC PANEL
BUN/Creatinine Ratio: 17 (ref 10–24)
BUN: 20 mg/dL (ref 8–27)
CO2: 23 mmol/L (ref 20–29)
Calcium: 9.5 mg/dL (ref 8.6–10.2)
Chloride: 101 mmol/L (ref 96–106)
Creatinine, Ser: 1.15 mg/dL (ref 0.76–1.27)
GFR calc Af Amer: 77 mL/min/{1.73_m2} (ref >59)
GFR calc non Af Amer: 67 mL/min/{1.73_m2} (ref >59)
Glucose: 126 mg/dL — ABNORMAL HIGH (ref 65–99)
Potassium: 4.7 mmol/L (ref 3.5–5.2)
Sodium: 141 mmol/L (ref 134–144)

## 2019-02-05 ENCOUNTER — Encounter: Payer: Self-pay | Admitting: Cardiology

## 2019-02-05 ENCOUNTER — Other Ambulatory Visit: Payer: Self-pay

## 2019-02-05 ENCOUNTER — Ambulatory Visit (INDEPENDENT_AMBULATORY_CARE_PROVIDER_SITE_OTHER): Payer: Medicare HMO | Admitting: Cardiology

## 2019-02-05 VITALS — BP 120/68 | HR 79 | Ht 72.0 in | Wt 220.2 lb

## 2019-02-05 DIAGNOSIS — I4819 Other persistent atrial fibrillation: Secondary | ICD-10-CM | POA: Diagnosis not present

## 2019-02-05 NOTE — Patient Instructions (Signed)
Medication Instructions:  Your physician has recommended you make the following change in your medication:  1. START Flecainide 50 mg twice a day -- you will not start this medication until the nurse advises you to.  * If you need a refill on your cardiac medications before your next appointment, please call your pharmacy.   Labwork: None ordered  Testing/Procedures: Your physician has recommended that you have a Cardioversion (DCCV). Electrical Cardioversion uses a jolt of electricity to your heart either through paddles or wired patches attached to your chest. This is a controlled, usually prescheduled, procedure. Defibrillation is done under light anesthesia in the hospital, and you usually go home the day of the procedure. This is done to get your heart back into a normal rhythm. You are not awake for the procedure.   The nurse will arrange this procedure once Flecainide is started.   Your provider has recommended a cardioversion.  You are scheduled for a cardioversion on ________ at  with Dr. _______ or associates. Please go to [facility] at ________. , Enter through the [place] not have any food or drink after midnight on ________.  You may take your medicines with a sip of water on the day of your procedure.  You will need someone to drive you home following your procedure.   Call the [place] office at [phone] if you have any questions, problems or concerns.   Follow-Up: To be determined.  *Please note that any paperwork needing to be filled out by the provider will need to be addressed at the front desk prior to seeing the provider. Please note that any FMLA, disability or other documents regarding health condition is subject to a $25.00 charge that must be received prior to completion of paperwork in the form of a money order or check.  Thank you for choosing [facility]!!   Trinidad Curet, RN [phone]  Any Other  Special Instructions Will Be Listed Below (If Applicable).  Discuss possible dates for shoulder surgery and call Trinidad Curet, RN with procedure date.

## 2019-02-05 NOTE — Progress Notes (Signed)
Electrophysiology Office Note   Date:  02/05/2019   ID:  ISAAK DELMUNDO, DOB 1955-11-05, MRN [id number]  PCP:  [name]., MD  Cardiologist: [name] Primary Electrophysiologist:  Trystyn Dolley Meredith Leeds, MD    Chief Complaint: Atrial fibrillation   History of Present Illness: CHANSE KAGEL is a 63 y.o. male who is being seen today for the evaluation of atrial fibrillation at the request of Park Liter, MD. Presenting today for electrophysiology evaluation.  He has a history of paroxysmal atrial fibrillation that has now become persistent.  He also has a history of cardiomyopathy with an ejection fraction which has since normalized, hyperlipidemia, prediabetes.  He had a cardioversion in July, but unfortunately went back into atrial fibrillation with a controlled ventricular rate.  His main complaint with atrial fibrillation is weakness and fatigue.  He does feel better when he is in sinus rhythm.  He continues to have symptoms from his atrial fibrillation.  He is also having fairly severe shoulder pain.  He has a rotator cuff injury and is planned for surgery.  Today, he denies symptoms of palpitations, chest pain, shortness of breath, orthopnea, PND, lower extremity edema, claudication, dizziness, presyncope, syncope, bleeding, or neurologic sequela. The patient is tolerating medications without difficulties.    Past Medical History:  Diagnosis Date  . Altered mental status 10/25/2018  . Atrial fibrillation with rapid ventricular response ([place]) 10/08/2018  . Cardiomyopathy ([place]) 10/25/2018   EF 25-30% on ST. 40% on ECHO 2020.  LVH  . Degeneration of lumbar intervertebral disc 07/30/2015  . DOE (dyspnea on exertion) 10/16/2018  . Hearing loss 10/15/2018  . High risk medication use 07/30/2015  . Long-term use of aspirin therapy 10/15/2018  . Malaise and fatigue 10/25/2018  . Mild episode of recurrent major depressive disorder ([place]) 07/30/2015   Last Assessment & Plan:  Relevant  Hx: Course: Daily Update: Today's Plan:Ryken Paschal refill his med for him today and he is stable with this  Electronically signed by: Baldemar Friday, [place] 07/30/15 [phone]  . Mixed hyperlipidemia 07/30/2015  . Postural dizziness with presyncope 10/25/2018  . Prediabetes 07/30/2015  . Right shoulder injury 10/08/2018  . Tear of right supraspinatus tendon 10/25/2018  . Tremor 07/30/2015  . Vitamin B 12 deficiency 07/30/2015  . White matter disease, unspecified 11/19/2018   Noted on MRI of brain 2020. Discussed with pt and wife.   Past Surgical History:  Procedure Laterality Date  . BACK SURGERY    . CHOLECYSTECTOMY    . HERNIA REPAIR  2009  . SHOULDER SURGERY Left 12/2010   Rotater cuff  . SPINAL FUSION  1998   Dr Lang Snow  . SPINE SURGERY  2001   Spinal hardware removal by Dr. Sherryle Lis     Current Outpatient Medications  Medication Sig Dispense Refill  . apixaban (ELIQUIS) 5 MG TABS tablet Take by mouth 2 (two) times daily.     . carvedilol (COREG) 12.5 MG tablet Take by mouth 2 (two) times a day. Take 1 tablet (12.5 mg total) by mouth 2 times daily with meals    . Cyanocobalamin (VITAMIN B12 PO) Take by mouth daily.    [name] diltiazem (CARDIZEM CD) 120 MG 24 hr capsule Take by mouth daily.    [name] PARoxetine (PAXIL) 40 MG tablet Take by mouth every morning.     No current facility-administered medications for this visit.     Allergies:   Patient has no known allergies.   Social History:  The patient  reports that he has never smoked. He has never used smokeless tobacco. He reports current alcohol use. He reports previous drug use.   Family History:  The patient's family history includes Breast cancer in his mother; Dementia in his maternal grandmother and mother; Hypertension in his father and mother; Multiple sclerosis in his son; Prostate cancer in his maternal grandfather.    ROS:  Please see the history of present illness.   Otherwise, review of systems is positive for none.   All other  systems are reviewed and negative.    PHYSICAL EXAM: VS:  BP 120/68   Pulse 79   Ht 6' (1.829 m)   Wt 220 lb 3.2 oz (99.9 kg)   SpO2 95%   BMI 29.86 kg/m  , BMI Body mass index is 29.86 kg/m. GEN: Well nourished, well developed, in no acute distress  HEENT: normal  Neck: no JVD, carotid bruits, or masses Cardiac: iRRR; no murmurs, rubs, or gallops,no edema  Respiratory:  clear to auscultation bilaterally, normal work of breathing GI: soft, nontender, nondistended, + BS MS: no deformity or atrophy  Skin: warm and dry Neuro:  Strength and sensation are intact Psych: euthymic mood, full affect  EKG:  EKG is ordered today. Personal review of the ekg ordered shows AF, rate 79  Recent Labs: 01/31/2019: BUN 20; Creatinine, Ser 1.15; Potassium 4.7; Sodium 141    Lipid Panel  No results found for: CHOL, TRIG, HDL, CHOLHDL, VLDL, LDLCALC, LDLDIRECT   Wt Readings from Last 3 Encounters:  02/05/19 220 lb 3.2 oz (99.9 kg)  01/21/19 217 lb (98.4 kg)  12/06/18 215 lb (97.5 kg)      Other studies Reviewed: Additional studies/ records that were reviewed today include: TTE 01/21/19  Review of the above records today demonstrates:   1. The left ventricle has normal systolic function, with an ejection fraction of 55-60%. The cavity size was normal. There is mildly increased left ventricular wall thickness. Left ventricular diastolic function could not be evaluated secondary to  atrial fibrillation.  2. The right ventricle has normal systolic function. The cavity was normal. There is no increase in right ventricular wall thickness. Right ventricular systolic pressure could not be assessed.  3. The aorta is normal unless otherwise noted.   ASSESSMENT AND PLAN:  1.  Persistent atrial fibrillation: Currently on Eliquis, carvedilol, diltiazem.  He has having symptoms from his atrial fibrillation, though he would prefer to have shoulder surgery prior to getting back into sinus rhythm.  I did  tell him that likely flecainide would be a reasonable option, and we Gerda Yin start that prior to cardioversion.  I have asked him to speak with his orthopedic surgeon to find a date for an operation.  We Elycia Woodside likely plan for resumption of sinus rhythm postop.  If he does have an operation, he would be able to stop his Eliquis without high risk of stroke.  He would be at intermediate risk for this intermediate risk procedure.  This patients CHA2DS2-VASc Score and unadjusted Ischemic Stroke Rate (% per year) is equal to 0.2 % stroke rate/year from a score of 0  Above score calculated as 1 point each if present [CHF, HTN, DM, Vascular=MI/PAD/Aortic Plaque, Age if 65-74, or Male] Above score calculated as 2 points each if present [Age > 75, or Stroke/TIA/TE]   2.  Dilated cardiomyopathy: Fortunately ejection fraction has increased back to normal on his most recent echo.  Plan per primary cardiology.    Current medicines are  reviewed at length with the patient today.   The patient does not have concerns regarding his medicines.  The following changes were made today:  none  Labs/ tests ordered today include:  Orders Placed This Encounter  Procedures  . EKG 12-Lead   Case discussed with primary cardiology  Disposition:   FU with Dorreen Valiente 3 months  Signed, Yoshimi Sarr Jorja Loa, MD  02/05/2019 9:04 AM     [facility] [address] [place] [address] [phone] (office) [phone] (fax)

## 2019-02-20 ENCOUNTER — Ambulatory Visit (INDEPENDENT_AMBULATORY_CARE_PROVIDER_SITE_OTHER): Payer: Medicare HMO | Admitting: Cardiology

## 2019-02-20 ENCOUNTER — Encounter: Payer: Self-pay | Admitting: Cardiology

## 2019-02-20 ENCOUNTER — Other Ambulatory Visit: Payer: Self-pay

## 2019-02-20 VITALS — BP 140/80 | HR 83 | Ht 72.0 in | Wt 221.0 lb

## 2019-02-20 DIAGNOSIS — R7303 Prediabetes: Secondary | ICD-10-CM

## 2019-02-20 DIAGNOSIS — I4819 Other persistent atrial fibrillation: Secondary | ICD-10-CM

## 2019-02-20 DIAGNOSIS — I420 Dilated cardiomyopathy: Secondary | ICD-10-CM | POA: Diagnosis not present

## 2019-02-20 NOTE — Progress Notes (Signed)
Cardiology Office Note:    Date:  02/20/2019   ID:  Richard Spence, DOB August 07, 1955, MRN [id number]  PCP:  Richard Spence., MD  Cardiologist:  Richard Campus, MD    Referring MD: Richard Spence., MD   Chief Complaint  Patient presents with  . 1 month follow up  Doing well  History of Present Illness:    Richard Spence is a 63 y.o. male with persistent atrial fibrillation.  He is being seen by EP team and conclusion was that he can go ahead and have his surgery for his shoulder and after that will take care of his rhythm probably will choose flecainide to help with controlling the rhythm and hopefully convert him to sinus rhythm.  From cardiac standpoint review is fine for him to proceed with surgery as scheduled.  Overall he is doing well denies have any chest pain tightness squeezing pressure been chest can walk climb stairs with no difficulties.  Past Medical History:  Diagnosis Date  . Altered mental status 10/25/2018  . Atrial fibrillation with rapid ventricular response ([place]) 10/08/2018  . Cardiomyopathy ([place]) 10/25/2018   EF 25-30% on ST. 40% on ECHO 2020.  LVH  . Degeneration of lumbar intervertebral disc 07/30/2015  . DOE (dyspnea on exertion) 10/16/2018  . Hearing loss 10/15/2018  . High risk medication use 07/30/2015  . Long-term use of aspirin therapy 10/15/2018  . Malaise and fatigue 10/25/2018  . Mild episode of recurrent major depressive disorder ([place]) 07/30/2015   Last Assessment & Plan:  Relevant Hx: Course: Daily Update: Today's Plan:will refill his med for him today and he is stable with this  Electronically signed by: Richard Spence, Richard Spence 07/30/15 [phone]  . Mixed hyperlipidemia 07/30/2015  . Postural dizziness with presyncope 10/25/2018  . Prediabetes 07/30/2015  . Right shoulder injury 10/08/2018  . Tear of right supraspinatus tendon 10/25/2018  . Tremor 07/30/2015  . Vitamin B 12 deficiency 07/30/2015  . White matter disease, unspecified 11/19/2018   Noted on MRI  of brain 2020. Discussed with pt and wife.    Past Surgical History:  Procedure Laterality Date  . BACK SURGERY    . CHOLECYSTECTOMY    . HERNIA REPAIR  2009  . SHOULDER SURGERY Left 12/2010   Rotater cuff  . SPINAL FUSION  1998   Dr Richard Spence  . [place]  2001   Spinal hardware removal by Dr. Sherryle Spence    Current Medications: Current Meds  Medication Sig  . apixaban (ELIQUIS) 5 MG TABS tablet Take by mouth 2 (two) times daily.   . carvedilol (COREG) 12.5 MG tablet Take by mouth 2 (two) times a day. Take 1 tablet (12.5 mg total) by mouth 2 times daily with meals  . Cyanocobalamin (VITAMIN B12 PO) Take by mouth daily.  [name] diltiazem (CARDIZEM CD) 120 MG 24 hr capsule Take by mouth daily.  [name] PARoxetine (PAXIL) 40 MG tablet Take by mouth every morning.     Allergies:   Patient has no known allergies.   Social History   Socioeconomic History  . Marital status: Married    Spouse name: Not on file  . Number of children: Not on file  . Years of education: Not on file  . Highest education level: Not on file  Occupational History  . Not on file  Social Needs  . Financial resource strain: Not on file  . Food insecurity    Worry: Not on file    Inability: Not on  file  . Transportation needs    Medical: Not on file    Non-medical: Not on file  Tobacco Use  . Smoking status: Never Smoker  . Smokeless tobacco: Never Used  Substance and Sexual Activity  . Alcohol use: Yes    Comment: once in a while  . Drug use: Not Currently  . Sexual activity: Not on file  Lifestyle  . Physical activity    Days per week: Not on file    Minutes per session: Not on file  . Stress: Not on file  Relationships  . Social Musician on phone: Not on file    Gets together: Not on file    Attends religious service: Not on file    Active member of club or organization: Not on file    Attends meetings of clubs or organizations: Not on file    Relationship status: Not on file  Other  Topics Concern  . Not on file  Social History Narrative  . Not on file     Family History: The patient's family history includes Breast cancer in his mother; Dementia in his maternal grandmother and mother; Hypertension in his father and mother; Multiple sclerosis in his son; Prostate cancer in his maternal grandfather. ROS:   Please see the history of present illness.    All 14 point review of systems negative except as described per history of present illness  EKGs/Labs/Other Studies Reviewed:      Recent Labs: 01/31/2019: BUN 20; Creatinine, Ser 1.15; Potassium 4.7; Sodium 141  Recent Lipid Panel No results found for: CHOL, TRIG, HDL, CHOLHDL, VLDL, LDLCALC, LDLDIRECT  Physical Exam:    VS:  BP 140/80   Pulse 83   Ht 6' (1.829 m)   Wt 221 lb (100.2 kg)   SpO2 98%   BMI 29.97 kg/m     Wt Readings from Last 3 Encounters:  02/20/19 221 lb (100.2 kg)  02/05/19 220 lb 3.2 oz (99.9 kg)  01/21/19 217 lb (98.4 kg)     GEN:  Well nourished, well developed in no acute distress HEENT: Normal NECK: No JVD; No carotid bruits LYMPHATICS: No lymphadenopathy CARDIAC: Irregularly irregular, no murmurs, no rubs, no gallops RESPIRATORY:  Clear to auscultation without rales, wheezing or rhonchi  ABDOMEN: Soft, non-tender, non-distended MUSCULOSKELETAL:  No edema; No deformity  SKIN: Warm and dry LOWER EXTREMITIES: no swelling NEUROLOGIC:  Alert and oriented x 3 PSYCHIATRIC:  Normal affect   ASSESSMENT:    1. Persistent atrial fibrillation, status post cardioversion in July 2020 about flip back to atrial fibrillation a few weeks later   2. Dilated cardiomyopathy (HCC)   3. Prediabetes    PLAN:    In order of problems listed above:  1. Persistent atrial fibrillation plan as outlined above.  Will try to antiarrhythmic therapy before proceeding for another cardioversion. 2. Dilated cardiomyopathy: Last echocardiogram showed ejection fraction 55-60.  We will continue present  management. 3. Prediabetes stable   Medication Adjustments/Labs and Tests Ordered: Current medicines are reviewed at length with the patient today.  Concerns regarding medicines are outlined above.  No orders of the defined types were placed in this encounter.  Medication changes: No orders of the defined types were placed in this encounter.   Signed, Georgeanna Lea, MD, [facility] 02/20/2019 11:12 AM    [facility]

## 2019-02-20 NOTE — Patient Instructions (Signed)
Medication Instructions:  Your physician recommends that you continue on your current medications as directed. Please refer to the Current Medication list given to you today.  If you need a refill on your cardiac medications before your next appointment, please call your pharmacy.   Lab work: None.  If you have labs (blood work) drawn today and your tests are completely normal, you will receive your results only by: . MyChart Message (if you have MyChart) OR . A paper copy in the mail If you have any lab test that is abnormal or we need to change your treatment, we will call you to review the results.  Testing/Procedures: None.   Follow-Up: At [facility], you and your health needs are our priority.  As part of our continuing mission to provide you with exceptional heart care, we have created designated Provider Care Teams.  These Care Teams include your primary Cardiologist (physician) and Advanced Practice Providers (APPs -  Physician Assistants and Nurse Practitioners) who all work together to provide you with the care you need, when you need it. You will need a follow up appointment in 6 weeks.  Please call our office 2 months in advance to schedule this appointment.  You may see No primary care provider on file. or another member of our [facility] Provider Team in [place]: Brian Munley, MD . Rajan Revankar, MD  Any Other Special Instructions Will Be Listed Below (If Applicable).     

## 2019-03-04 ENCOUNTER — Ambulatory Visit: Payer: Medicare HMO | Admitting: Cardiology

## 2019-03-07 HISTORY — PX: ROTATOR CUFF REPAIR: SHX139

## 2019-03-12 DIAGNOSIS — Z9889 Other specified postprocedural states: Secondary | ICD-10-CM | POA: Insufficient documentation

## 2019-03-12 HISTORY — DX: Other specified postprocedural states: Z98.890

## 2019-04-17 ENCOUNTER — Encounter: Payer: Self-pay | Admitting: Cardiology

## 2019-04-17 ENCOUNTER — Telehealth: Payer: Self-pay | Admitting: Emergency Medicine

## 2019-04-17 ENCOUNTER — Other Ambulatory Visit: Payer: Self-pay

## 2019-04-17 ENCOUNTER — Ambulatory Visit (INDEPENDENT_AMBULATORY_CARE_PROVIDER_SITE_OTHER): Payer: Medicare HMO | Admitting: Cardiology

## 2019-04-17 VITALS — BP 110/70 | HR 89 | Ht 72.0 in | Wt 224.2 lb

## 2019-04-17 DIAGNOSIS — I4819 Other persistent atrial fibrillation: Secondary | ICD-10-CM | POA: Diagnosis not present

## 2019-04-17 DIAGNOSIS — Z79899 Other long term (current) drug therapy: Secondary | ICD-10-CM

## 2019-04-17 DIAGNOSIS — R7303 Prediabetes: Secondary | ICD-10-CM

## 2019-04-17 DIAGNOSIS — I420 Dilated cardiomyopathy: Secondary | ICD-10-CM

## 2019-04-17 MED ORDER — FLECAINIDE ACETATE 50 MG PO TABS: 50.0000 mg | ORAL_TABLET | Freq: Two times a day (BID) | ORAL | 1 refills | Status: DC

## 2019-04-17 NOTE — Addendum Note (Signed)
Addended by: Ashok Norris on: 04/17/2019 09:17 AM   Modules accepted: Orders

## 2019-04-17 NOTE — Progress Notes (Signed)
Cardiology Office Note:    Date:  04/17/2019   ID:  Richard Spence, DOB 1955-11-30, MRN [id number]  PCP:  [name]., MD  Cardiologist:  [name], MD    Referring MD: [name]., MD   Chief Complaint  Patient presents with  . 6 week follow up    History of Present Illness:    Richard Spence is a 63 y.o. male with past medical history significant for persistent atrial fibrillation.  Overall he did see EP team with consideration of cardioversion.  We did try to cardiovert him once however after successful cardioversion during the follow-up visit he was noted to be back to atrial fibrillation plan was to wait for him to have his shoulder surgery and then after that meeting now initiation antiarrhythmic therapy.  Overall he is doing well denies have any chest pain, tightness, pressure, burning in the chest.  He is very satisfied with results of his shoulder surgery does not have pain anymore.  He participated in rehab and enjoying it.  What we will do today's EKG if EKG showed normal QT interval will initiate flecainide 50 mg twice a day.  I will bring him back to the office on Friday to check again EKG make sure everything is okay.  I warned him of the signs and symptoms of potential problem meaning arrhythmia.  I want him to let me know if you have some dizziness passing out or palpitations.  He understand he will notify me about it.  Past Medical History:  Diagnosis Date  . Altered mental status 10/25/2018  . Atrial fibrillation with rapid ventricular response ([place]) 10/08/2018  . Cardiomyopathy ([place]) 10/25/2018   EF 25-30% on ST. 40% on ECHO 2020.  LVH  . Degeneration of lumbar intervertebral disc 07/30/2015  . DOE (dyspnea on exertion) 10/16/2018  . Hearing loss 10/15/2018  . High risk medication use 07/30/2015  . Long-term use of aspirin therapy 10/15/2018  . Malaise and fatigue 10/25/2018  . Mild episode of recurrent major depressive disorder ([place]) 07/30/2015   Last  Assessment & Plan:  Relevant Hx: Course: Daily Update: Today's Plan:will refill his med for him today and he is stable with this  Electronically signed by: Baldemar Friday, [place] 07/30/15 [phone]  . Mixed hyperlipidemia 07/30/2015  . Postural dizziness with presyncope 10/25/2018  . Prediabetes 07/30/2015  . Right shoulder injury 10/08/2018  . Tear of right supraspinatus tendon 10/25/2018  . Tremor 07/30/2015  . Vitamin B 12 deficiency 07/30/2015  . White matter disease, unspecified 11/19/2018   Noted on MRI of brain 2020. Discussed with pt and wife.    Past Surgical History:  Procedure Laterality Date  . BACK SURGERY    . CHOLECYSTECTOMY    . HERNIA REPAIR  2009  . ROTATOR CUFF REPAIR  03/07/2019  . SHOULDER SURGERY Left 12/2010   Rotater cuff  . SPINAL FUSION  1998   Dr Lang Snow  . [place]  2001   Spinal hardware removal by Dr. Sherryle Lis    Current Medications: Current Meds  Medication Sig  . apixaban (ELIQUIS) 5 MG TABS tablet Take by mouth 2 (two) times daily.   . Ascorbic Acid (VITAMIN C) 1000 MG tablet Take 1,000 mg by mouth daily.  . carvedilol (COREG) 12.5 MG tablet Take by mouth 2 (two) times a day. Take 1 tablet (12.5 mg total) by mouth 2 times daily with meals  . Cholecalciferol (VITAMIN D3) 125 MCG (5000 UT) CAPS Take 1  capsule by mouth daily.  . Cyanocobalamin (VITAMIN B12 PO) Take by mouth daily.  [name] diltiazem (CARDIZEM CD) 120 MG 24 hr capsule Take by mouth daily.  [name] PARoxetine (PAXIL) 40 MG tablet Take by mouth every morning.  . Zinc 50 MG TABS Take 1 tablet by mouth daily.     Allergies:   Patient has no known allergies.   Social History   Socioeconomic History  . Marital status: Married    Spouse name: Not on file  . Number of children: Not on file  . Years of education: Not on file  . Highest education level: Not on file  Occupational History  . Not on file  Social Needs  . Financial resource strain: Not on file  . Food insecurity    Worry: Not on  file    Inability: Not on file  . Transportation needs    Medical: Not on file    Non-medical: Not on file  Tobacco Use  . Smoking status: Never Smoker  . Smokeless tobacco: Never Used  Substance and Sexual Activity  . Alcohol use: Yes    Comment: once in a while  . Drug use: Not Currently  . Sexual activity: Not on file  Lifestyle  . Physical activity    Days per week: Not on file    Minutes per session: Not on file  . Stress: Not on file  Relationships  . Social Musician on phone: Not on file    Gets together: Not on file    Attends religious service: Not on file    Active member of club or organization: Not on file    Attends meetings of clubs or organizations: Not on file    Relationship status: Not on file  Other Topics Concern  . Not on file  Social History Narrative  . Not on file     Family History: The patient's family history includes Breast cancer in his mother; Dementia in his maternal grandmother and mother; Hypertension in his father and mother; Multiple sclerosis in his son; Prostate cancer in his maternal grandfather. ROS:   Please see the history of present illness.    All 14 point review of systems negative except as described per history of present illness  EKGs/Labs/Other Studies Reviewed:      Recent Labs: 01/31/2019: BUN 20; Creatinine, Ser 1.15; Potassium 4.7; Sodium 141  Recent Lipid Panel No results found for: CHOL, TRIG, HDL, CHOLHDL, VLDL, LDLCALC, LDLDIRECT  Physical Exam:    VS:  BP 110/70   Pulse 89   Ht 6' (1.829 m)   Wt 224 lb 3.2 oz (101.7 kg)   SpO2 96%   BMI 30.41 kg/m     Wt Readings from Last 3 Encounters:  04/17/19 224 lb 3.2 oz (101.7 kg)  02/20/19 221 lb (100.2 kg)  02/05/19 220 lb 3.2 oz (99.9 kg)     GEN:  Well nourished, well developed in no acute distress HEENT: Normal NECK: No JVD; No carotid bruits LYMPHATICS: No lymphadenopathy CARDIAC: RRR, no murmurs, no rubs, no gallops RESPIRATORY:  Clear  to auscultation without rales, wheezing or rhonchi  ABDOMEN: Soft, non-tender, non-distended MUSCULOSKELETAL:  No edema; No deformity  SKIN: Warm and dry LOWER EXTREMITIES: no swelling NEUROLOGIC:  Alert and oriented x 3 PSYCHIATRIC:  Normal affect   ASSESSMENT:    1. Persistent atrial fibrillation, status post cardioversion in July 2020 about flip back to atrial fibrillation a few weeks later  2. Dilated cardiomyopathy (HCC)   3. Prediabetes   4. High risk medication use    PLAN:    In order of problems listed above:  1. Persistent atrial fibrillation plan as outlined above. 2. Dilated cardiomyopathy last echocardiogram showed normal left ventricle ejection fraction. 3. Prediabetes that managed by internal medicine team. 4. Will initiate flecainide if EKG is fine.  Will attempt cardioversion within next 2 3 weeks.   Medication Adjustments/Labs and Tests Ordered: Current medicines are reviewed at length with the patient today.  Concerns regarding medicines are outlined above.  No orders of the defined types were placed in this encounter.  Medication changes: No orders of the defined types were placed in this encounter.   Signed, Georgeanna Lea, MD, [facility] 04/17/2019 8:48 AM    [facility]

## 2019-04-17 NOTE — Telephone Encounter (Signed)
Called patient's wife per dpr advised her to have patient hold flecainide for now. Dr. [name] is going to discuss further with Dr. Curt Bears. Wife reports he already took one dose and he is cold and shaking and tired, (shaking is normal for him sometimes per wife), she called to check on him and he reported the shaking is better and he was just tired now. Dr. [name] aware. He will consult with Dr. Curt Bears and we will follow up with patient. Wife informed to have patient hold any flecainide for now, she verbally understood no further questions.

## 2019-04-17 NOTE — Patient Instructions (Addendum)
Medication Instructions:  Your physician has recommended you make the following change in your medication:  STARTT: Flecainide 50 mg twice daily  *If you need a refill on your cardiac medications before your next appointment, please call your pharmacy*  Lab Work: None.  If you have labs (blood work) drawn today and your tests are completely normal, you will receive your results only by: [name] MyChart Message (if you have MyChart) OR . A paper copy in the mail If you have any lab test that is abnormal or we need to change your treatment, we will call you to review the results.  Testing/Procedures: None.   Follow-Up: At [facility], you and your health needs are our priority.  As part of our continuing mission to provide you with exceptional heart care, we have created designated Provider Care Teams.  These Care Teams include your primary Cardiologist (physician) and Advanced Practice Providers (APPs -  Physician Assistants and Nurse Practitioners) who all work together to provide you with the care you need, when you need it.  Your next appointment:   2 week  You will also return on Friday for an appointment   The format for your next appointment:   In Person  Provider:   [name], MD  Other Instructions

## 2019-04-18 NOTE — Telephone Encounter (Signed)
Wife called back she said the patient feels better. I informed her we are waiting to hear back from Dr. Curt Bears to make sure sure decisions on the next steps. I advised her we will call with updates when we have them. She verbally understood. No further questions.

## 2019-04-18 NOTE — Telephone Encounter (Signed)
Left message for wife to return call 

## 2019-04-18 NOTE — Telephone Encounter (Signed)
Left message on home phone for patient to return call.

## 2019-04-19 ENCOUNTER — Ambulatory Visit: Payer: Medicare HMO | Admitting: Cardiology

## 2019-04-19 NOTE — Telephone Encounter (Signed)
Left message for patient's wife to return call about appointment to day.

## 2019-04-19 NOTE — Telephone Encounter (Signed)
Left message on home number for patient to return call.

## 2019-04-19 NOTE — Telephone Encounter (Signed)
Patient returned your call.

## 2019-04-19 NOTE — Telephone Encounter (Signed)
Called patients wife back earlier advised her that Dr. Curt Bears is okay with the patient start flecainide even though taking Paxil. The wife will call and check to let us know if he is okay with taking it since it caused him to "shake". Cancelled appointment for today. Will await her call.

## 2019-04-19 NOTE — Telephone Encounter (Signed)
Patients wife called back, patient will take flecainide 50 mg twice daily ad have ekg repeated on Monday. No further questions.

## 2019-04-22 ENCOUNTER — Encounter: Payer: Self-pay | Admitting: Cardiology

## 2019-04-22 ENCOUNTER — Ambulatory Visit (INDEPENDENT_AMBULATORY_CARE_PROVIDER_SITE_OTHER): Payer: Medicare HMO | Admitting: Cardiology

## 2019-04-22 ENCOUNTER — Other Ambulatory Visit: Payer: Self-pay

## 2019-04-22 ENCOUNTER — Telehealth: Payer: Self-pay | Admitting: Cardiology

## 2019-04-22 ENCOUNTER — Encounter: Payer: Self-pay | Admitting: *Deleted

## 2019-04-22 VITALS — BP 104/80 | HR 83 | Ht 72.0 in | Wt 224.4 lb

## 2019-04-22 DIAGNOSIS — I4819 Other persistent atrial fibrillation: Secondary | ICD-10-CM | POA: Diagnosis not present

## 2019-04-22 DIAGNOSIS — I4891 Unspecified atrial fibrillation: Secondary | ICD-10-CM | POA: Diagnosis not present

## 2019-04-22 LAB — CBC
Hematocrit: 50.8 % (ref 37.5–51.0)
Hemoglobin: 17.4 g/dL (ref 13.0–17.7)
MCH: 31.5 pg (ref 26.6–33.0)
MCHC: 34.3 g/dL (ref 31.5–35.7)
MCV: 92 fL (ref 79–97)
Platelets: 270 10*3/uL (ref 150–450)
RBC: 5.53 x10E6/uL (ref 4.14–5.80)
RDW: 12.7 % (ref 11.6–15.4)
WBC: 6 10*3/uL (ref 3.4–10.8)

## 2019-04-22 LAB — BASIC METABOLIC PANEL
BUN/Creatinine Ratio: 19 (ref 10–24)
BUN: 22 mg/dL (ref 8–27)
CO2: 22 mmol/L (ref 20–29)
Calcium: 9.8 mg/dL (ref 8.6–10.2)
Chloride: 102 mmol/L (ref 96–106)
Creatinine, Ser: 1.15 mg/dL (ref 0.76–1.27)
GFR calc Af Amer: 77 mL/min/{1.73_m2} (ref >59)
GFR calc non Af Amer: 67 mL/min/{1.73_m2} (ref >59)
Glucose: 82 mg/dL (ref 65–99)
Potassium: 4.5 mmol/L (ref 3.5–5.2)
Sodium: 142 mmol/L (ref 134–144)

## 2019-04-22 LAB — MAGNESIUM: Magnesium: 2.4 mg/dL — ABNORMAL HIGH (ref 1.6–2.3)

## 2019-04-22 NOTE — Progress Notes (Signed)
Cardiology Office Note:    Date:  04/22/2019   ID:  YAMEN CASTROGIOVANNI, DOB 1955-11-18, MRN [id number]  PCP:  [name], MD  Cardiologist:  No primary care provider on file.  Electrophysiologist:  Will Jorja Loa, MD   Referring MD: [name], MD   Follow-up visit  History of Present Illness:    Richard Spence is a 63 y.o. male with a hx of persistent atrial fibrillation on Eliquis, he status post cardioversion x1, was recently started on antiarrhythmic with flecainide 50 mg twice daily.  He was most recently seen by Dr. Bing Matter on April 17, 2019.  Today the patient is here for follow-up for her EKG in the setting of his recent antiarrhythmic starting.  He offers no complaints at this time.   Past Medical History:  Diagnosis Date  . Altered mental status 10/25/2018  . Atrial fibrillation with rapid ventricular response (HCC) 10/08/2018  . Cardiomyopathy (HCC) 10/25/2018   EF 25-30% on ST. 40% on ECHO 2020.  [facility]  . Degeneration of lumbar intervertebral disc 07/30/2015  . DOE (dyspnea on exertion) 10/16/2018  . Hearing loss 10/15/2018  . High risk medication use 07/30/2015  . Long-term use of aspirin therapy 10/15/2018  . Malaise and fatigue 10/25/2018  . 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 Mixed hyperlipidemia 07/30/2015  . Postural dizziness with presyncope 10/25/2018  . Prediabetes 07/30/2015  . Right shoulder injury 10/08/2018  . Tear of right supraspinatus tendon 10/25/2018  . Tremor 07/30/2015  . Vitamin B 12 deficiency 07/30/2015  . White matter disease, unspecified 11/19/2018   Noted on MRI of brain 2020. Discussed with pt and wife.    Past Surgical History:  Procedure Laterality Date  . BACK SURGERY    . CHOLECYSTECTOMY     . HERNIA REPAIR  2009  . ROTATOR CUFF REPAIR  03/07/2019  . SHOULDER SURGERY Left 12/2010   Rotater cuff  . SPINAL FUSION  1998   Dr Nadene Rubins  . SPINE SURGERY  2001   Spinal hardware removal by Dr. Lilian Kapur    Current Medications: Current Meds  Medication Sig  . apixaban (ELIQUIS) 5 MG TABS tablet Take by mouth 2 (two) times daily.   . Ascorbic Acid (VITAMIN C) 1000 MG tablet Take 1,000 mg by mouth daily.  . carvedilol (COREG) 12.5 MG tablet Take by mouth 2 (two) times a day. Take 1 tablet (12.5 mg total) by mouth 2 times daily with meals  . Cholecalciferol (VITAMIN D3) 125 MCG (5000 UT) CAPS Take 1 capsule by mouth daily.  . Cyanocobalamin (VITAMIN B12 PO) Take by mouth daily.  [name] diltiazem (CARDIZEM CD) 120 MG 24 hr capsule Take by mouth daily.  . flecainide (TAMBOCOR) 50 MG tablet Take 1 tablet (50 mg total) by mouth 2 (two) times daily.  [name] PARoxetine (PAXIL) 40 MG tablet Take by mouth every morning.  . Zinc 50 MG TABS Take 1 tablet by mouth daily.     Allergies:   Patient has no known allergies.   Social History   Socioeconomic History  . Marital status: Married    Spouse name: Not on file  . Number of children: Not on file  . Years of education: Not on file  . Highest education  level: Not on file  Occupational History  . Not on file  Social Needs  . Financial resource strain: Not on file  . Food insecurity    Worry: Not on file    Inability: Not on file  . Transportation needs    Medical: Not on file    Non-medical: Not on file  Tobacco Use  . Smoking status: Never Smoker  . Smokeless tobacco: Never Used  Substance and Sexual Activity  . Alcohol use: Yes    Comment: once in a while  . Drug use: Not Currently  . Sexual activity: Not on file  Lifestyle  . Physical activity    Days per week: Not on file    Minutes per session: Not on file  . Stress: Not on file  Relationships  . Social Musicianconnections    Talks on phone: Not on file    Gets together: Not on file     Attends religious service: Not on file    Active member of club or organization: Not on file    Attends meetings of clubs or organizations: Not on file    Relationship status: Not on file  Other Topics Concern  . Not on file  Social History Narrative  . Not on file     Family History: The patient's family history includes Breast cancer in his mother; Dementia in his maternal grandmother and mother; Hypertension in his father and mother; Multiple sclerosis in his son; Prostate cancer in his maternal grandfather.  ROS:   Review of Systems  Constitution: Negative for decreased appetite, fever and weight gain.  HENT: Negative for congestion, ear discharge, hoarse voice and sore throat.   Eyes: Negative for discharge, redness, vision loss in right eye and visual halos.  Cardiovascular: Negative for chest pain, dyspnea on exertion, leg swelling, orthopnea and palpitations.  Respiratory: Negative for cough, hemoptysis, shortness of breath and snoring.   Endocrine: Negative for heat intolerance and polyphagia.  Hematologic/Lymphatic: Negative for bleeding problem. Does not bruise/bleed easily.  Skin: Negative for flushing, nail changes, rash and suspicious lesions.  Musculoskeletal: Negative for arthritis, joint pain, muscle cramps, myalgias, neck pain and stiffness.  Gastrointestinal: Negative for abdominal pain, bowel incontinence, diarrhea and excessive appetite.  Genitourinary: Negative for decreased libido, genital sores and incomplete emptying.  Neurological: Negative for brief paralysis, focal weakness, headaches and loss of balance.  Psychiatric/Behavioral: Negative for altered mental status, depression and suicidal ideas.  Allergic/Immunologic: Negative for HIV exposure and persistent infections.    EKGs/Labs/Other Studies Reviewed:    The following studies were reviewed today:   EKG:  The ekg ordered today demonstrates atrial fibrillation, heart rate 83 bpm compared to EKG  on 04/17/2019 patient no longer has rapid ventricular response.  Recent Labs: 01/31/2019: BUN 20; Creatinine, Ser 1.15; Potassium 4.7; Sodium 141  Recent Lipid Panel No results found for: CHOL, TRIG, HDL, CHOLHDL, VLDL, LDLCALC, LDLDIRECT  Physical Exam:    VS:  BP 104/80   Pulse 83   Ht 6' (1.829 m)   Wt 224 lb 6.4 oz (101.8 kg)   BMI 30.43 kg/m     Wt Readings from Last 3 Encounters:  04/22/19 224 lb 6.4 oz (101.8 kg)  04/17/19 224 lb 3.2 oz (101.7 kg)  02/20/19 221 lb (100.2 kg)     GEN: Well nourished, well developed in no acute distress HEENT: Normal NECK: No JVD; No carotid bruits LYMPHATICS: No lymphadenopathy CARDIAC: S1S2 noted,RRR, no murmurs, rubs, gallops RESPIRATORY:  Clear to auscultation  without rales, wheezing or rhonchi  ABDOMEN: Soft, non-tender, non-distended, +bowel sounds, no guarding. EXTREMITIES: No edema, No cyanosis, no clubbing MUSCULOSKELETAL:  No edema; No deformity  SKIN: Warm and dry NEUROLOGIC:  Alert and oriented x 3, non-focal PSYCHIATRIC:  Normal affect, good insight  ASSESSMENT:    1. Persistent atrial fibrillation ([place])    PLAN:    1.  He was recently started on arrhythmic therapy with flecainide 50 mg twice daily.  Today he is in atrial fibrillation however ventricular rate controlled.  At this time will be appropriate to schedule patient for cardioversion.  He was educated not to interrupt his Eliquis for any reason.  He is to maintain his Eliquis with no eruption prior to his cardioversion and he also was educated to continue this with no interruption 4 weeks post cardioversion.  Patient did not have any other questions during this encounter.  The patient is in agreement with the above plan. The patient left the office in stable condition.  The patient will follow up in Dr. [name] in 1 month.  Medication Adjustments/Labs and Tests Ordered: Current medicines are reviewed at length with the patient today.  Concerns regarding  medicines are outlined above.  No orders of the defined types were placed in this encounter.  No orders of the defined types were placed in this encounter.   There are no Patient Instructions on file for this visit.   Adopting a Healthy Lifestyle.  Know what a healthy weight is for you (roughly BMI <25) and aim to maintain this   Aim for 7+ servings of fruits and vegetables daily   65-80+ fluid ounces of water or unsweet tea for healthy kidneys   Limit to max 1 drink of alcohol per day; avoid smoking/tobacco   Limit animal fats in diet for cholesterol and heart health - choose grass fed whenever available   Avoid highly processed foods, and foods high in saturated/trans fats   Aim for low stress - take time to unwind and care for your mental health   Aim for 150 min of moderate intensity exercise weekly for heart health, and weights twice weekly for bone health   Aim for 7-9 hours of sleep daily   When it comes to diets, agreement about the perfect plan isnt easy to find, even among the experts. Experts at the [employer] developed an idea known as the Healthy Eating Plate. Just imagine a plate divided into logical, healthy portions.   The emphasis is on diet quality:   Load up on vegetables and fruits - one-half of your plate: Aim for color and variety, and remember that potatoes dont count.   Go for whole grains - one-quarter of your plate: Whole wheat, barley, wheat berries, quinoa, oats, brown rice, and foods made with them. If you want pasta, go with whole wheat pasta.   Protein power - one-quarter of your plate: Fish, chicken, beans, and nuts are all healthy, versatile protein sources. Limit red meat.   The diet, however, does go beyond the plate, offering a few other suggestions.   Use healthy plant oils, such as olive, canola, soy, corn, sunflower and peanut. Check the labels, and avoid partially hydrogenated oil, which have unhealthy trans  fats.   If youre thirsty, drink water. Coffee and tea are good in moderation, but skip sugary drinks and limit milk and dairy products to one or two daily servings.   The type of carbohydrate in the diet is more  important than the amount. Some sources of carbohydrates, such as vegetables, fruits, whole grains, and beans-are healthier than others.   Finally, stay active  Signed, Thomasene Ripple, DO  04/22/2019 10:05 AM    [facility]

## 2019-04-22 NOTE — Telephone Encounter (Signed)
Spoke with patient wife. Explained trying to get cardioversion scheduled for 04/29/19 which is a Monday. Will call with time when hospital gives it to [place].

## 2019-04-22 NOTE — Telephone Encounter (Signed)
Wife called stating patient can only do the cardioversion on Mondays.  Pleaes call wife back at [phone]

## 2019-04-22 NOTE — Patient Instructions (Signed)
Medication Instructions:  Your physician recommends that you continue on your current medications as directed. Please refer to the Current Medication list given to you today.  *If you need a refill on your cardiac medications before your next appointment, please call your pharmacy*  Lab Work: Your physician recommends that you return for lab work in: TODAY BMP,CBC, MAgnesium  If you have labs (blood work) drawn today and your tests are completely normal, you will receive your results only by: [name] MyChart Message (if you have MyChart) OR . A paper copy in the mail If you have any lab test that is abnormal or we need to change your treatment, we will call you to review the results.  Testing/Procedures: Your physician has recommended that you have a Cardioversion (DCCV). Electrical Cardioversion uses a jolt of electricity to your heart either through paddles or wired patches attached to your chest. This is a controlled, usually prescheduled, procedure. Defibrillation is done under light anesthesia in the hospital, and you usually go home the day of the procedure. This is done to get your heart back into a normal rhythm. You are not awake for the procedure. Please see the instruction sheet given to you today.  Continue your Eliquis until told to stop by Dr [name].  Follow-Up: At [facility], you and your health needs are our priority.  As part of our continuing mission to provide you with exceptional heart care, we have created designated Provider Care Teams.  These Care Teams include your primary Cardiologist (physician) and Advanced Practice Providers (APPs -  Physician Assistants and Nurse Practitioners) who all work together to provide you with the care you need, when you need it.  Your next appointment:   1 month  The format for your next appointment:   In Person  Provider:   [name], MD  Other Instructions

## 2019-04-22 NOTE — Addendum Note (Signed)
Addended by: Ashok Norris on: 04/22/2019 11:23 AM   Modules accepted: Orders

## 2019-04-22 NOTE — Addendum Note (Signed)
Addended by: Particia Nearing B on: 04/22/2019 10:15 AM   Modules accepted: Orders

## 2019-04-23 NOTE — Telephone Encounter (Signed)
Patient still waiting on cardioversion date and time. Please advise

## 2019-04-24 NOTE — Telephone Encounter (Signed)
Telephone call to wife. Explained that the hospital will not let us schedule for Monday until tomorrow morning. Will call with date and time.

## 2019-04-29 DIAGNOSIS — I4891 Unspecified atrial fibrillation: Secondary | ICD-10-CM | POA: Diagnosis not present

## 2019-05-01 ENCOUNTER — Ambulatory Visit: Payer: Medicare HMO | Admitting: Cardiology

## 2019-05-06 ENCOUNTER — Encounter: Payer: Self-pay | Admitting: Cardiology

## 2019-05-06 ENCOUNTER — Ambulatory Visit (INDEPENDENT_AMBULATORY_CARE_PROVIDER_SITE_OTHER): Payer: Medicare HMO | Admitting: Cardiology

## 2019-05-06 ENCOUNTER — Other Ambulatory Visit: Payer: Self-pay

## 2019-05-06 VITALS — BP 116/60 | HR 50 | Ht 72.0 in | Wt 224.4 lb

## 2019-05-06 DIAGNOSIS — R7303 Prediabetes: Secondary | ICD-10-CM | POA: Diagnosis not present

## 2019-05-06 DIAGNOSIS — I420 Dilated cardiomyopathy: Secondary | ICD-10-CM | POA: Diagnosis not present

## 2019-05-06 DIAGNOSIS — E782 Mixed hyperlipidemia: Secondary | ICD-10-CM

## 2019-05-06 DIAGNOSIS — I480 Paroxysmal atrial fibrillation: Secondary | ICD-10-CM

## 2019-05-06 HISTORY — DX: Paroxysmal atrial fibrillation: I48.0

## 2019-05-06 MED ORDER — CARVEDILOL 6.25 MG PO TABS: 6.2500 mg | ORAL_TABLET | Freq: Two times a day (BID) | ORAL | 1 refills | Status: DC

## 2019-05-06 NOTE — Addendum Note (Signed)
Addended by: Ashok Norris on: 05/06/2019 10:08 AM   Modules accepted: Orders

## 2019-05-06 NOTE — Addendum Note (Signed)
Addended by: Ashok Norris on: 05/06/2019 10:33 AM   Modules accepted: Orders

## 2019-05-06 NOTE — Patient Instructions (Signed)
Medication Instructions:  Your physician has recommended you make the following change in your medication:   DECREASE: Carvedilol to 6.25 mg twice daily    Lab Work: None.  If you have labs (blood work) drawn today and your tests are completely normal, you will receive your results only by: [name] MyChart Message (if you have MyChart) OR . A paper copy in the mail If you have any lab test that is abnormal or we need to change your treatment, we will call you to review the results.  Testing/Procedures: Your physician has requested that you have an echocardiogram. Echocardiography is a painless test that uses sound waves to create images of your heart. It provides your doctor with information about the size and shape of your heart and how well your heart's chambers and valves are working. This procedure takes approximately one hour. There are no restrictions for this procedure.    Follow-Up: At [facility], you and your health needs are our priority.  As part of our continuing mission to provide you with exceptional heart care, we have created designated Provider Care Teams.  These Care Teams include your primary Cardiologist (physician) and Advanced Practice Providers (APPs -  Physician Assistants and Nurse Practitioners) who all work together to provide you with the care you need, when you need it.  Your next appointment:   6 week(s)  The format for your next appointment:   In Person  Provider:   [name], MD  Other Instructions   Echocardiogram An echocardiogram is a procedure that uses painless sound waves (ultrasound) to produce an image of the heart. Images from an echocardiogram can provide important information about:  Signs of coronary artery disease (CAD).  Aneurysm detection. An aneurysm is a weak or damaged part of an artery wall that bulges out from the normal force of blood pumping through the body.  Heart size and shape. Changes in the size or shape of the  heart can be associated with certain conditions, including heart failure, aneurysm, and CAD.  Heart muscle function.  Heart valve function.  Signs of a past heart attack.  Fluid buildup around the heart.  Thickening of the heart muscle.  A tumor or infectious growth around the heart valves. Tell a health care provider about:  Any allergies you have.  All medicines you are taking, including vitamins, herbs, eye drops, creams, and over-the-counter medicines.  Any blood disorders you have.  Any surgeries you have had.  Any medical conditions you have.  Whether you are pregnant or may be pregnant. What are the risks? Generally, this is a safe procedure. However, problems may occur, including:  Allergic reaction to dye (contrast) that may be used during the procedure. What happens before the procedure? No specific preparation is needed. You may eat and drink normally. What happens during the procedure?   An IV tube may be inserted into one of your veins.  You may receive contrast through this tube. A contrast is an injection that improves the quality of the pictures from your heart.  A gel will be applied to your chest.  A wand-like tool (transducer) will be moved over your chest. The gel will help to transmit the sound waves from the transducer.  The sound waves will harmlessly bounce off of your heart to allow the heart images to be captured in real-time motion. The images will be recorded on a computer. The procedure may vary among health care providers and hospitals. What happens after the procedure?  You may return to your normal, everyday life, including diet, activities, and medicines, unless your health care provider tells you not to do that. Summary  An echocardiogram is a procedure that uses painless sound waves (ultrasound) to produce an image of the heart.  Images from an echocardiogram can provide important information about the size and shape of your  heart, heart muscle function, heart valve function, and fluid buildup around your heart.  You do not need to do anything to prepare before this procedure. You may eat and drink normally.  After the echocardiogram is completed, you may return to your normal, everyday life, unless your health care provider tells you not to do that. This information is not intended to replace advice given to you by your health care provider. Make sure you discuss any questions you have with your health care provider. Document Released: 05/20/2000 Document Revised: 09/13/2018 Document Reviewed: 06/25/2016 Elsevier Patient Education  2020 [employer].

## 2019-05-06 NOTE — Progress Notes (Signed)
Cardiology Office Note:    Date:  05/06/2019   ID:  Richard Spence, DOB 1955/11/29, MRN [id number]  PCP:  [name], MD  Cardiologist:  [name], MD    Referring MD: [name], MD   Chief Complaint  Patient presents with  . Follow-up on Cardioversion  Doing better  History of Present Illness:    Richard Spence is a 63 y.o. male with paroxysmal atrial fibrillation.  Last week he had electrical cardioversion done.  He converted with 200 J to sinus rhythm on the second attempt.  He is maintaining on flecainide and Eliquis as well as AV blocking agent in the form of carvedilol as well as diltiazem.  He said he is doing well cannot tell much difference compared to before and after cardioversion yesterday he gets one episode when he walk with his dog and have momentary sensation of dizziness.  He did not passed out he did not have any chest pain he did not have any shortness of breath.  Obviously this episode is concerning.  Denies having any palpitations during the episode.  Past Medical History:  Diagnosis Date  . Altered mental status 10/25/2018  . Atrial fibrillation with rapid ventricular response (HCC) 10/08/2018  . Cardiomyopathy (HCC) 10/25/2018   EF 25-30% on ST. 40% on ECHO 2020.  LVH  . Degeneration of lumbar intervertebral disc 07/30/2015  . DOE (dyspnea on exertion) 10/16/2018  . Hearing loss 10/15/2018  . High risk medication use 07/30/2015  . Long-term use of aspirin therapy 10/15/2018  . Malaise and fatigue 10/25/2018  . 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   Last Assessment & Plan:  Relevant Hx: Course: Daily Update: Today's Plan:will refill his med for him today and he is stable with this  Electronically signed by: [name], FNP 07/30/15 [phone]  . Mixed hyperlipidemia 07/30/2015  . Postural dizziness with presyncope 10/25/2018  . Prediabetes 07/30/2015  . Right shoulder injury 10/08/2018  . Tear of right  supraspinatus tendon 10/25/2018  . Tremor 07/30/2015  . Vitamin B 12 deficiency 07/30/2015  . White matter disease, unspecified 11/19/2018   Noted on MRI of brain 2020. Discussed with pt and wife.    Past Surgical History:  Procedure Laterality Date  . BACK SURGERY    . CHOLECYSTECTOMY    . HERNIA REPAIR  2009  . ROTATOR CUFF REPAIR  03/07/2019  . SHOULDER SURGERY Left 12/2010   Rotater cuff  . SPINAL FUSION  1998   Dr Nadene Rubins  . SPINE SURGERY  2001   Spinal hardware removal by Dr. Lilian Kapur    Current Medications: Current Meds  Medication Sig  . apixaban (ELIQUIS) 5 MG TABS tablet Take by mouth 2 (two) times daily.   . Ascorbic Acid (VITAMIN C) 1000 MG tablet Take 1,000 mg by mouth daily.  . carvedilol (COREG) 12.5 MG tablet Take by mouth 2 (two) times a day. Take 1 tablet (12.5 mg total) by mouth 2 times daily with meals  . Cholecalciferol (VITAMIN D3) 125 MCG (5000 UT) CAPS Take 1 capsule by mouth daily.  . Cyanocobalamin (VITAMIN B12 PO) Take by mouth daily.  [name] diltiazem (CARDIZEM CD) 120 MG 24 hr capsule Take by mouth daily.  . flecainide (TAMBOCOR) 50 MG tablet Take 1 tablet (50 mg total) by mouth 2 (two) times daily.  [name] PARoxetine (PAXIL) 40 MG tablet Take by mouth every morning.  . Zinc 50 MG TABS Take 1 tablet by mouth  daily.     Allergies:   Patient has no known allergies.   Social History   Socioeconomic History  . Marital status: Married    Spouse name: Not on file  . Number of children: Not on file  . Years of education: Not on file  . Highest education level: Not on file  Occupational History  . Not on file  Social Needs  . Financial resource strain: Not on file  . Food insecurity    Worry: Not on file    Inability: Not on file  . Transportation needs    Medical: Not on file    Non-medical: Not on file  Tobacco Use  . Smoking status: Never Smoker  . Smokeless tobacco: Never Used  Substance and Sexual Activity  . Alcohol use: Yes    Comment: once in a  while  . Drug use: Not Currently  . Sexual activity: Not on file  Lifestyle  . Physical activity    Days per week: Not on file    Minutes per session: Not on file  . Stress: Not on file  Relationships  . Social Musician on phone: Not on file    Gets together: Not on file    Attends religious service: Not on file    Active member of club or organization: Not on file    Attends meetings of clubs or organizations: Not on file    Relationship status: Not on file  Other Topics Concern  . Not on file  Social History Narrative  . Not on file     Family History: The patient's family history includes Breast cancer in his mother; Dementia in his maternal grandmother and mother; Hypertension in his father and mother; Multiple sclerosis in his son; Prostate cancer in his maternal grandfather. ROS:   Please see the history of present illness.    All 14 point review of systems negative except as described per history of present illness  EKGs/Labs/Other Studies Reviewed:      Recent Labs: 04/22/2019: BUN 22; Creatinine, Ser 1.15; Hemoglobin 17.4; Magnesium 2.4; Platelets 270; Potassium 4.5; Sodium 142  Recent Lipid Panel No results found for: CHOL, TRIG, HDL, CHOLHDL, VLDL, LDLCALC, LDLDIRECT  Physical Exam:    VS:  BP 116/60   Pulse (!) 50   Ht 6' (1.829 m)   Wt 224 lb 6.4 oz (101.8 kg)   SpO2 96%   BMI 30.43 kg/m     Wt Readings from Last 3 Encounters:  05/06/19 224 lb 6.4 oz (101.8 kg)  04/22/19 224 lb 6.4 oz (101.8 kg)  04/17/19 224 lb 3.2 oz (101.7 kg)     GEN:  Well nourished, well developed in no acute distress HEENT: Normal NECK: No JVD; No carotid bruits LYMPHATICS: No lymphadenopathy CARDIAC: RRR, no murmurs, no rubs, no gallops RESPIRATORY:  Clear to auscultation without rales, wheezing or rhonchi  ABDOMEN: Soft, non-tender, non-distended MUSCULOSKELETAL:  No edema; No deformity  SKIN: Warm and dry LOWER EXTREMITIES: no swelling NEUROLOGIC:   Alert and oriented x 3 PSYCHIATRIC:  Normal affect   ASSESSMENT:    1. Prediabetes   2. Paroxysmal atrial fibrillation (HCC)   3. Dilated cardiomyopathy (HCC)   4. Mixed hyperlipidemia    PLAN:    In order of problems listed above:  1. Paroxysmal atrial fibrillation status post cardioversion.  His EKG today showed sinus bradycardia rate of 50, normal P interval, nonspecific ST segment changes normal QT interval.  I will  ask him to lower his carvedilol from 12.5-6.25 twice daily.  Hopefully that will give him more energy and prevent from episode of dizziness.  I told him he may need to let us know if he had another episode of dizziness if that is the case and then will put monitor on him.  I will also schedule him to have an echocardiogram to reassess his left ventricle ejection fraction.  We will continue with flecainide at present dose.  We will continue anticoagulation for at least 1 month 2. Dilated cardiomyopathy: Will do echocardiogram to reassess left ventricle ejection fraction 3. Mixed dyslipidemia follow-up by internal medicine team.   Medication Adjustments/Labs and Tests Ordered: Current medicines are reviewed at length with the patient today.  Concerns regarding medicines are outlined above.  No orders of the defined types were placed in this encounter.  Medication changes: No orders of the defined types were placed in this encounter.   Signed, Park Liter, MD, [facility] 05/06/2019 9:53 AM    [place]

## 2019-05-08 ENCOUNTER — Other Ambulatory Visit: Payer: Self-pay

## 2019-05-08 ENCOUNTER — Ambulatory Visit (HOSPITAL_BASED_OUTPATIENT_CLINIC_OR_DEPARTMENT_OTHER)
Admission: RE | Admit: 2019-05-08 | Discharge: 2019-05-08 | Disposition: A | Discharge status: Home / Self Care | Payer: Medicare HMO | Source: Ambulatory Visit | Attending: Cardiology | Admitting: Cardiology

## 2019-05-08 DIAGNOSIS — I420 Dilated cardiomyopathy: Secondary | ICD-10-CM | POA: Diagnosis not present

## 2019-05-08 DIAGNOSIS — I480 Paroxysmal atrial fibrillation: Secondary | ICD-10-CM | POA: Insufficient documentation

## 2019-05-08 NOTE — Progress Notes (Signed)
  Echocardiogram 2D Echocardiogram has been performed.  Cardell Peach 05/08/2019, 10:58 AM

## 2019-05-10 ENCOUNTER — Other Ambulatory Visit: Payer: Self-pay | Admitting: Cardiology

## 2019-05-10 ENCOUNTER — Telehealth: Payer: Self-pay | Admitting: Emergency Medicine

## 2019-05-10 NOTE — Telephone Encounter (Signed)
Patient returned your call.

## 2019-05-10 NOTE — Telephone Encounter (Signed)
Patient wife informed of results per dpr.  

## 2019-05-10 NOTE — Telephone Encounter (Signed)
Left message for patient to return call regarding results  

## 2019-05-22 ENCOUNTER — Ambulatory Visit: Payer: Medicare HMO | Admitting: Cardiology

## 2019-06-03 ENCOUNTER — Telehealth: Payer: Self-pay | Admitting: Cardiology

## 2019-06-03 NOTE — Telephone Encounter (Signed)
Patient states his afib restarted on 12/26

## 2019-06-05 NOTE — Telephone Encounter (Signed)
Dr. [name] called patient's wife.

## 2019-06-05 NOTE — Telephone Encounter (Signed)
Call returned to Pt's wife.  Pt states first she wants Dr. Wendy Poet email.  Says he said she should have it to immediately let him know when Pt is in afib.  Gave Dr. Marthann Schiller work email to wife.  She states she will send his kardia strips.  Pt's wife is very unhappy she was not called back.  Apologized for delay.  Pt's wife states Pt is symptomatic with afib, he is tired and dizzy.  Wife wants a call back from [facility] or Dr. [name].  Advised this nurse would call [facility] office to see who is available today and will call her back.

## 2019-06-05 NOTE — Telephone Encounter (Signed)
Per Dr. [name] called patient's wife and got the patient scheduled for a appointment in [place] tomorrow to have a ekg.

## 2019-06-06 ENCOUNTER — Encounter: Payer: Self-pay | Admitting: Cardiology

## 2019-06-06 ENCOUNTER — Ambulatory Visit (INDEPENDENT_AMBULATORY_CARE_PROVIDER_SITE_OTHER): Payer: Medicare HMO | Admitting: Cardiology

## 2019-06-06 ENCOUNTER — Other Ambulatory Visit: Payer: Self-pay

## 2019-06-06 VITALS — BP 124/76 | HR 65 | Ht 72.0 in | Wt 238.0 lb

## 2019-06-06 DIAGNOSIS — I480 Paroxysmal atrial fibrillation: Secondary | ICD-10-CM | POA: Diagnosis not present

## 2019-06-06 DIAGNOSIS — R5383 Other fatigue: Secondary | ICD-10-CM

## 2019-06-06 HISTORY — DX: Other fatigue: R53.83

## 2019-06-06 NOTE — Patient Instructions (Signed)
Medication Instructions:  Your physician recommends that you continue on your current medications as directed. Please refer to the Current Medication list given to you today.  *If you need a refill on your cardiac medications before your next appointment, please call your pharmacy*  Lab Work: Your physician recommends that you return for lab work in: [place]  If you have labs (blood work) drawn today and your tests are completely normal, you will receive your results only by: [name] MyChart Message (if you have MyChart) OR . A paper copy in the mail If you have any lab test that is abnormal or we need to change your treatment, we will call you to review the results.  Testing/Procedures: Your physician has recommended that you have a sleep study. This test records several body functions during sleep, including: brain activity, eye movement, oxygen and carbon dioxide blood levels, heart rate and rhythm, breathing rate and rhythm, the flow of air through your mouth and nose, snoring, body muscle movements, and chest and belly movement.   Follow-Up: At [facility], you and your health needs are our priority.  As part of our continuing mission to provide you with exceptional heart care, we have created designated Provider Care Teams.  These Care Teams include your primary Cardiologist (physician) and Advanced Practice Providers (APPs -  Physician Assistants and Nurse Practitioners) who all work together to provide you with the care you need, when you need it.  Your next appointment:    Follow up with Dr [name] as planned   The format for your next appointment:   In Person  Provider:   [name], MD  Other Instructions

## 2019-06-06 NOTE — Progress Notes (Signed)
Cardiology Office Note:    Date:  06/06/2019   ID:  Richard Spence, DOB 1955-07-03, MRN [id number]  PCP:  [name], MD  Cardiologist:  No primary care provider on file.  Electrophysiologist:  Will Jorja Loa, MD   Referring MD: [name], MD   Follow up visit  History of Present Illness:    Richard Spence is a 63 y.o. male with a hx of atrial fibrillation on flecainide and apixaban, cardiomyopathy, mixed hyperlipidemia, prediabetes presents today for follow-up visit.  Patient usually sees Dr. Bing Matter and last seen by him on May 06, 2019.  At that time he had recently underwent cardioversion.  During the visit he complained of dizziness at the conclusion of the visit his carvedilol was decreased from 12.5 mg to 6.25 mg twice a day.  With hopes that this will also help his fatigue and dizziness.  He is here today for follow-up visit.  He states that with the decreased dose of beta-blocker his symptoms improve minimally.  He still tells me that he is generally tired during the day and he is not sleeping well at night.  Denies any chest pain, breath, nausea, vomiting.  Past Medical History:  Diagnosis Date  . Altered mental status 10/25/2018  . Atrial fibrillation with rapid ventricular response (HCC) 10/08/2018  . Cardiomyopathy (HCC) 10/25/2018   EF 25-30% on ST. 40% on ECHO 2020.  LVH  . Degeneration of lumbar intervertebral disc 07/30/2015  . DOE (dyspnea on exertion) 10/16/2018  . Hearing loss 10/15/2018  . High risk medication use 07/30/2015  . Long-term use of aspirin therapy 10/15/2018  . Malaise and fatigue 10/25/2018  . 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   Last Assessment & Plan:  Relevant Hx: Course: Daily Update: Today's Plan:will refill his med for him today and he is stable with this  Electronically signed by: [name], FNP 07/30/15 [phone]  . Mixed hyperlipidemia 07/30/2015  . Postural dizziness with  presyncope 10/25/2018  . Prediabetes 07/30/2015  . Right shoulder injury 10/08/2018  . Tear of right supraspinatus tendon 10/25/2018  . Tremor 07/30/2015  . Vitamin B 12 deficiency 07/30/2015  . White matter disease, unspecified 11/19/2018   Noted on MRI of brain 2020. Discussed with pt and wife.    Past Surgical History:  Procedure Laterality Date  . BACK SURGERY    . CHOLECYSTECTOMY    . HERNIA REPAIR  2009  . ROTATOR CUFF REPAIR  03/07/2019  . SHOULDER SURGERY Left 12/2010   Rotater cuff  . SPINAL FUSION  1998   Dr Nadene Rubins  . SPINE SURGERY  2001   Spinal hardware removal by Dr. Lilian Kapur    Current Medications: Current Meds  Medication Sig  . apixaban (ELIQUIS) 5 MG TABS tablet Take by mouth 2 (two) times daily.   . Ascorbic Acid (VITAMIN C) 1000 MG tablet Take 1,000 mg by mouth daily.  . carvedilol (COREG) 6.25 MG tablet Take 1 tablet (6.25 mg total) by mouth 2 (two) times daily. Take one tablet twice daily.  . Cholecalciferol (VITAMIN D3) 125 MCG (5000 UT) CAPS Take 1 capsule by mouth daily.  . Cyanocobalamin (VITAMIN B12 PO) Take by mouth daily.  [name] diltiazem (CARDIZEM CD) 120 MG 24 hr capsule Take by mouth daily.  . flecainide (TAMBOCOR) 50 MG tablet TAKE 1 TABLET BY MOUTH TWICE (2) DAILY  . PARoxetine (PAXIL) 40 MG tablet Take by mouth every morning.  . Zinc 50 MG  TABS Take 1 tablet by mouth daily.     Allergies:   Patient has no known allergies.   Social History   Socioeconomic History  . Marital status: Married    Spouse name: Not on file  . Number of children: Not on file  . Years of education: Not on file  . Highest education level: Not on file  Occupational History  . Not on file  Tobacco Use  . Smoking status: Never Smoker  . Smokeless tobacco: Never Used  Substance and Sexual Activity  . Alcohol use: Yes    Comment: once in a while  . Drug use: Not Currently  . Sexual activity: Not on file  Other Topics Concern  . Not on file  Social History Narrative   . Not on file   Social Determinants of Health   Financial Resource Strain:   . Difficulty of Paying Living Expenses: Not on file  Food Insecurity:   . Worried About Programme researcher, broadcasting/film/videounning Out of Food in the Last Year: Not on file  . Ran Out of Food in the Last Year: Not on file  Transportation Needs:   . Lack of Transportation (Medical): Not on file  . Lack of Transportation (Non-Medical): Not on file  Physical Activity:   . Days of Exercise per Week: Not on file  . Minutes of Exercise per Session: Not on file  Stress:   . Feeling of Stress : Not on file  Social Connections:   . Frequency of Communication with Friends and Family: Not on file  . Frequency of Social Gatherings with Friends and Family: Not on file  . Attends Religious Services: Not on file  . Active Member of Clubs or Organizations: Not on file  . Attends BankerClub or Organization Meetings: Not on file  . Marital Status: Not on file     Family History: The patient's family history includes Breast cancer in his mother; Dementia in his maternal grandmother and mother; Hypertension in his father and mother; Multiple sclerosis in his son; Prostate cancer in his maternal grandfather.  ROS:   Review of Systems  Constitution: Negative for decreased appetite, fever and weight gain.  HENT: Negative for congestion, ear discharge, hoarse voice and sore throat.   Eyes: Negative for discharge, redness, vision loss in right eye and visual halos.  Cardiovascular: Negative for chest pain, dyspnea on exertion, leg swelling, orthopnea and palpitations.  Respiratory: Negative for cough, hemoptysis, shortness of breath and snoring.   Endocrine: Negative for heat intolerance and polyphagia.  Hematologic/Lymphatic: Negative for bleeding problem. Does not bruise/bleed easily.  Skin: Negative for flushing, nail changes, rash and suspicious lesions.  Musculoskeletal: Negative for arthritis, joint pain, muscle cramps, myalgias, neck pain and stiffness.   Gastrointestinal: Negative for abdominal pain, bowel incontinence, diarrhea and excessive appetite.  Genitourinary: Negative for decreased libido, genital sores and incomplete emptying.  Neurological: Negative for brief paralysis, focal weakness, headaches and loss of balance.  Psychiatric/Behavioral: Negative for altered mental status, depression and suicidal ideas.  Allergic/Immunologic: Negative for HIV exposure and persistent infections.    EKGs/Labs/Other Studies Reviewed:    The following studies were reviewed today:    EKG:  The ekg ordered today demonstrates sinus rhythm, heart rate 65 bpm with poor precordial progression which could be suggestive of old infarction.  Compared to EKG done on 04/05/2019 patient is still in sinus rhythm no longer sinus bradycardia.  TTE IMPRESSIONS  1. Left ventricular ejection fraction, by visual estimation, is 50 to 55%. The  left ventricle has normal function. There is no left ventricular hypertrophy.  2. Left atrial size was normal.  3. The tricuspid valve is normal in structure. Tricuspid valve regurgitation is mild. FINDINGS  Left Ventricle: Left ventricular ejection fraction, by visual estimation, is 50 to 55%. The left ventricle has normal function. There is no left ventricular hypertrophy. Normal left atrial pressure. Right Ventricle: The right ventricular size is normal. No increase in right ventricular wall thickness. Global RV systolic function is has normal systolic function. The tricuspid regurgitant velocity is 2.20 m/s, and with an assumed right atrial pressure  of 3 mmHg, the estimated right ventricular systolic pressure is normal at 22.4 mmHg.  Left Atrium: Left atrial size was normal in size.  Right Atrium: Right atrial size was normal in size  Pericardium: There is no evidence of pericardial effusion.  Mitral Valve: The mitral valve is normal in structure. No evidence of mitral valve stenosis by observation. No evidence  of mitral valve regurgitation.  Tricuspid Valve: The tricuspid valve is normal in structure. Tricuspid valve regurgitation is mild.  Aortic Valve: The aortic valve is normal in structure. Aortic valve regurgitation is not visualized. The aortic valve is structurally normal, with no evidence of sclerosis or stenosis. Aortic valve peak gradient measures 9.4 mmHg.  Pulmonic Valve: The pulmonic valve was normal in structure. Pulmonic valve regurgitation is not visualized.  Aorta: The aortic root, ascending aorta and aortic arch are all structurally normal, with no evidence of dilitation or obstruction.  Venous: The inferior vena cava is normal in size with greater than 50% respiratory variability, suggesting right atrial pressure of 3 mmHg.  IAS/Shunts: No atrial level shunt detected by color flow Doppler. There is no evidence of a patent foramen ovale. No ventricular septal defect is seen or detected. There is no evidence of an atrial septal defect.    Recent Labs: 04/22/2019: BUN 22; Creatinine, Ser 1.15; Hemoglobin 17.4; Magnesium 2.4; Platelets 270; Potassium 4.5; Sodium 142  Recent Lipid Panel No results found for: CHOL, TRIG, HDL, CHOLHDL, VLDL, LDLCALC, LDLDIRECT  Physical Exam:    VS:  BP 124/76   Pulse 65   Ht 6' (1.829 m)   Wt 238 lb (108 kg)   BMI 32.28 kg/m     Wt Readings from Last 3 Encounters:  06/06/19 238 lb (108 kg)  05/06/19 224 lb 6.4 oz (101.8 kg)  04/22/19 224 lb 6.4 oz (101.8 kg)     GEN: Well nourished, well developed in no acute distress HEENT: Normal NECK: No JVD; No carotid bruits LYMPHATICS: No lymphadenopathy CARDIAC: S1S2 noted,RRR, no murmurs, rubs, gallops RESPIRATORY:  Clear to auscultation without rales, wheezing or rhonchi  ABDOMEN: Soft, non-tender, non-distended, +bowel sounds, no guarding. EXTREMITIES: No edema, No cyanosis, no clubbing MUSCULOSKELETAL:  No edema; No deformity  SKIN: Warm and dry NEUROLOGIC:  Alert and oriented x  3, non-focal PSYCHIATRIC:  Normal affect, good insight  ASSESSMENT:    1. PAF (paroxysmal atrial fibrillation) ([place])   2. Fatigue, unspecified type    PLAN:    1.  The patient fatigue has somewhat improved but not yet at his baseline he tells me.  He still is in sinus rhythm but no longer bradycardic.  I would keep him on his current dose of carvedilol 6.25 mg twice a day, Cardizem 120 mg daily as well as flecainide 50 mg twice daily.  Continue his Eliquis 5 mg twice daily.  2.  In terms of his fatigue based on his  history I would like [place] to rule out sleep apnea as a possibility.  We will set the patient up for sleep study.  Blood work will be performed today for TSH as well as B12.  The patient is in agreement with the above plan. The patient left the office in stable condition.  The patient will follow up as scheduled with Dr. Bing Matter.   Medication Adjustments/Labs and Tests Ordered: Current medicines are reviewed at length with the patient today.  Concerns regarding medicines are outlined above.  No orders of the defined types were placed in this encounter.  No orders of the defined types were placed in this encounter.   There are no Patient Instructions on file for this visit.   Adopting a Healthy Lifestyle.  Know what a healthy weight is for you (roughly BMI <25) and aim to maintain this   Aim for 7+ servings of fruits and vegetables daily   65-80+ fluid ounces of water or unsweet tea for healthy kidneys   Limit to max 1 drink of alcohol per day; avoid smoking/tobacco   Limit animal fats in diet for cholesterol and heart health - choose grass fed whenever available   Avoid highly processed foods, and foods high in saturated/trans fats   Aim for low stress - take time to unwind and care for your mental health   Aim for 150 min of moderate intensity exercise weekly for heart health, and weights twice weekly for bone health   Aim for 7-9 hours of sleep daily   When  it comes to diets, agreement about the perfect plan isnt easy to find, even among the experts. Experts at the [facility] of [employer] developed an idea known as the Healthy Eating Plate. Just imagine a plate divided into logical, healthy portions.   The emphasis is on diet quality:   Load up on vegetables and fruits - one-half of your plate: Aim for color and variety, and remember that potatoes dont count.   Go for whole grains - one-quarter of your plate: Whole wheat, barley, wheat berries, quinoa, oats, brown rice, and foods made with them. If you want pasta, go with whole wheat pasta.   Protein power - one-quarter of your plate: Fish, chicken, beans, and nuts are all healthy, versatile protein sources. Limit red meat.   The diet, however, does go beyond the plate, offering a few other suggestions.   Use healthy plant oils, such as olive, canola, soy, corn, sunflower and peanut. Check the labels, and avoid partially hydrogenated oil, which have unhealthy trans fats.   If youre thirsty, drink water. Coffee and tea are good in moderation, but skip sugary drinks and limit milk and dairy products to one or two daily servings.   The type of carbohydrate in the diet is more important than the amount. Some sources of carbohydrates, such as vegetables, fruits, whole grains, and beans-are healthier than others.   Finally, stay active  Signed, Thomasene Ripple, DO  06/06/2019 10:34 AM    [facility]

## 2019-06-07 LAB — VITAMIN B12: Vitamin B-12: 1111 pg/mL (ref 232–1245)

## 2019-06-07 LAB — TSH: TSH: 1.03 u[IU]/mL (ref 0.450–4.500)

## 2019-06-17 ENCOUNTER — Other Ambulatory Visit: Payer: Self-pay

## 2019-06-17 ENCOUNTER — Ambulatory Visit (INDEPENDENT_AMBULATORY_CARE_PROVIDER_SITE_OTHER): Payer: Medicare HMO | Admitting: Cardiology

## 2019-06-17 ENCOUNTER — Telehealth: Payer: Self-pay | Admitting: *Deleted

## 2019-06-17 ENCOUNTER — Encounter: Payer: Self-pay | Admitting: Cardiology

## 2019-06-17 VITALS — BP 128/82 | HR 55 | Ht 72.0 in | Wt 230.0 lb

## 2019-06-17 DIAGNOSIS — I420 Dilated cardiomyopathy: Secondary | ICD-10-CM | POA: Diagnosis not present

## 2019-06-17 DIAGNOSIS — R5381 Other malaise: Secondary | ICD-10-CM

## 2019-06-17 DIAGNOSIS — I480 Paroxysmal atrial fibrillation: Secondary | ICD-10-CM | POA: Diagnosis not present

## 2019-06-17 DIAGNOSIS — R0609 Other forms of dyspnea: Secondary | ICD-10-CM

## 2019-06-17 DIAGNOSIS — R0600 Dyspnea, unspecified: Secondary | ICD-10-CM | POA: Diagnosis not present

## 2019-06-17 DIAGNOSIS — R5383 Other fatigue: Secondary | ICD-10-CM

## 2019-06-17 NOTE — Patient Instructions (Signed)

## 2019-06-17 NOTE — Telephone Encounter (Signed)
Sleep study and COVID appointment details left on home voicemail.  Also left quarantine instructions. Richard Spence sleep disorders contact information given to patient for reference.

## 2019-06-17 NOTE — Progress Notes (Signed)
Cardiology Office Note:    Date:  06/17/2019   ID:  Richard Spence, DOB 1955-09-12, MRN [id number]  PCP:  [name], MD  Cardiologist:  [name], MD    Referring MD: [name], MD   No chief complaint on file. Doing well  History of Present Illness:    Richard Spence is a 64 y.o. male with paroxysmal atrial fibrillation, about 2 months ago he underwent her second electrical cardioversion with flecainide on board.  He seems to maintaining sinus rhythm.  Continue anticoagulation recent complaint that he got was weakness fatigue and tiredness.  Echocardiogram last ejection fraction 50 to 55%.  There was some suspicion for B12 deficiency which was.  Normal level, [name] comes today 2 months for follow-up but overall he said he is feeling better I think the biggest problem he have is his chronic back problem he did have surgery done before and he complained about that he said he is getting gradually better.  He denies have any palpitations dizziness passing out shortness of breath or tightness in the chest  Past Medical History:  Diagnosis Date  . Altered mental status 10/25/2018  . Atrial fibrillation with rapid ventricular response (HCC) 10/08/2018  . Cardiomyopathy (HCC) 10/25/2018   EF 25-30% on ST. 40% on ECHO 2020.  LVH  . Degeneration of lumbar intervertebral disc 07/30/2015  . DOE (dyspnea on exertion) 10/16/2018  . Hearing loss 10/15/2018  . High risk medication use 07/30/2015  . Long-term use of aspirin therapy 10/15/2018  . Malaise and fatigue 10/25/2018  . 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   Last Assessment & Plan:  Relevant Hx: Course: Daily Update: Today's Plan:will refill his med for him today and he is stable with this  Electronically signed by: [name], FNP 07/30/15 [phone]  . Mixed hyperlipidemia 07/30/2015  . Postural dizziness with presyncope 10/25/2018  . Prediabetes 07/30/2015  . Right shoulder injury 10/08/2018   . Tear of right supraspinatus tendon 10/25/2018  . Tremor 07/30/2015  . Vitamin B 12 deficiency 07/30/2015  . White matter disease, unspecified 11/19/2018   Noted on MRI of brain 2020. Discussed with pt and wife.    Past Surgical History:  Procedure Laterality Date  . BACK SURGERY    . CHOLECYSTECTOMY    . HERNIA REPAIR  2009  . ROTATOR CUFF REPAIR  03/07/2019  . SHOULDER SURGERY Left 12/2010   Rotater cuff  . SPINAL FUSION  1998   Dr Nadene Rubins  . SPINE SURGERY  2001   Spinal hardware removal by Dr. Lilian Kapur    Current Medications: Current Meds  Medication Sig  . apixaban (ELIQUIS) 5 MG TABS tablet Take by mouth 2 (two) times daily.   . Ascorbic Acid (VITAMIN C) 1000 MG tablet Take 1,000 mg by mouth daily.  . carvedilol (COREG) 6.25 MG tablet Take 1 tablet (6.25 mg total) by mouth 2 (two) times daily. Take one tablet twice daily.  . Cholecalciferol (VITAMIN D3) 125 MCG (5000 UT) CAPS Take 1 capsule by mouth daily.  . Cyanocobalamin (VITAMIN B12 PO) Take by mouth daily.  [name] diltiazem (CARDIZEM CD) 120 MG 24 hr capsule Take by mouth daily.  . flecainide (TAMBOCOR) 50 MG tablet TAKE 1 TABLET BY MOUTH TWICE (2) DAILY  . PARoxetine (PAXIL) 40 MG tablet Take by mouth every morning.  . Zinc 50 MG TABS Take 1 tablet by mouth daily.     Allergies:   Patient has no  known allergies.   Social History   Socioeconomic History  . Marital status: Married    Spouse name: Not on file  . Number of children: Not on file  . Years of education: Not on file  . Highest education level: Not on file  Occupational History  . Not on file  Tobacco Use  . Smoking status: Never Smoker  . Smokeless tobacco: Never Used  Substance and Sexual Activity  . Alcohol use: Yes    Comment: once in a while  . Drug use: Not Currently  . Sexual activity: Not on file  Other Topics Concern  . Not on file  Social History Narrative  . Not on file   Social Determinants of Health   Financial Resource Strain:    . Difficulty of Paying Living Expenses: Not on file  Food Insecurity:   . Worried About Charity fundraiser in the Last Year: Not on file  . Ran Out of Food in the Last Year: Not on file  Transportation Needs:   . Lack of Transportation (Medical): Not on file  . Lack of Transportation (Non-Medical): Not on file  Physical Activity:   . Days of Exercise per Week: Not on file  . Minutes of Exercise per Session: Not on file  Stress:   . Feeling of Stress : Not on file  Social Connections:   . Frequency of Communication with Friends and Family: Not on file  . Frequency of Social Gatherings with Friends and Family: Not on file  . Attends Religious Services: Not on file  . Active Member of Clubs or Organizations: Not on file  . Attends Archivist Meetings: Not on file  . Marital Status: Not on file     Family History: The patient's family history includes Breast cancer in his mother; Dementia in his maternal grandmother and mother; Hypertension in his father and mother; Multiple sclerosis in his son; Prostate cancer in his maternal grandfather. ROS:   Please see the history of present illness.    All 14 point review of systems negative except as described per history of present illness  EKGs/Labs/Other Studies Reviewed:      Recent Labs: 04/22/2019: BUN 22; Creatinine, Ser 1.15; Hemoglobin 17.4; Magnesium 2.4; Platelets 270; Potassium 4.5; Sodium 142 06/06/2019: TSH 1.030  Recent Lipid Panel No results found for: CHOL, TRIG, HDL, CHOLHDL, VLDL, LDLCALC, LDLDIRECT  Physical Exam:    VS:  BP 128/82   Pulse (!) 55   Ht 6' (1.829 m)   Wt 230 lb (104.3 kg)   SpO2 96%   BMI 31.19 kg/m     Wt Readings from Last 3 Encounters:  06/17/19 230 lb (104.3 kg)  06/06/19 238 lb (108 kg)  05/06/19 224 lb 6.4 oz (101.8 kg)     GEN:  Well nourished, well developed in no acute distress HEENT: Normal NECK: No JVD; No carotid bruits LYMPHATICS: No lymphadenopathy CARDIAC: RRR,  no murmurs, no rubs, no gallops RESPIRATORY:  Clear to auscultation without rales, wheezing or rhonchi  ABDOMEN: Soft, non-tender, non-distended MUSCULOSKELETAL:  No edema; No deformity  SKIN: Warm and dry LOWER EXTREMITIES: no swelling NEUROLOGIC:  Alert and oriented x 3 PSYCHIATRIC:  Normal affect   ASSESSMENT:    1. Paroxysmal atrial fibrillation (HCC)   2. Dilated cardiomyopathy ([place])   3. Malaise and fatigue   4. DOE (dyspnea on exertion)    PLAN:    In order of problems listed above:  1. Paroxysmal atrial fibrillation,  continue anticoagulation and flecainide.  Doing well maintaining sinus rhythm 2. Dilated cardiomyopathy on appropriate medications with last assessment of echocardiogram showing ejection fraction of 5055% 3. Malaise and fatigue that is gradually improving.  He is trying to exercise and be able be more active 4. Dyspnea on exertion stable   Medication Adjustments/Labs and Tests Ordered: Current medicines are reviewed at length with the patient today.  Concerns regarding medicines are outlined above.  No orders of the defined types were placed in this encounter.  Medication changes: No orders of the defined types were placed in this encounter.   Signed, Georgeanna Lea, MD, [facility] 06/17/2019 10:26 AM    [facility]

## 2019-06-27 ENCOUNTER — Other Ambulatory Visit (HOSPITAL_COMMUNITY)
Admission: RE | Admit: 2019-06-27 | Discharge: 2019-06-27 | Disposition: A | Discharge status: Home / Self Care | Payer: Medicare HMO | Source: Ambulatory Visit | Attending: Cardiovascular Disease | Admitting: Cardiovascular Disease

## 2019-06-27 DIAGNOSIS — Z20822 Contact with and (suspected) exposure to covid-19: Secondary | ICD-10-CM | POA: Diagnosis not present

## 2019-06-27 DIAGNOSIS — Z01812 Encounter for preprocedural laboratory examination: Secondary | ICD-10-CM | POA: Insufficient documentation

## 2019-06-27 LAB — SARS CORONAVIRUS 2 (TAT 6-24 HRS): SARS Coronavirus 2: NEGATIVE

## 2019-06-27 MED ORDER — FENTANYL CITRATE (PF) 100 MCG/2ML IJ SOLN
INTRAMUSCULAR | Status: AC
  Filled 2019-06-27: qty 2

## 2019-06-30 ENCOUNTER — Ambulatory Visit (HOSPITAL_BASED_OUTPATIENT_CLINIC_OR_DEPARTMENT_OTHER): Payer: Medicare HMO | Attending: Cardiology | Admitting: Cardiovascular Disease

## 2019-06-30 ENCOUNTER — Other Ambulatory Visit: Payer: Self-pay

## 2019-06-30 DIAGNOSIS — I480 Paroxysmal atrial fibrillation: Secondary | ICD-10-CM | POA: Insufficient documentation

## 2019-06-30 DIAGNOSIS — R5383 Other fatigue: Secondary | ICD-10-CM | POA: Diagnosis not present

## 2019-06-30 DIAGNOSIS — G4719 Other hypersomnia: Secondary | ICD-10-CM | POA: Diagnosis not present

## 2019-06-30 DIAGNOSIS — G4733 Obstructive sleep apnea (adult) (pediatric): Secondary | ICD-10-CM

## 2019-06-30 DIAGNOSIS — R0683 Snoring: Secondary | ICD-10-CM

## 2019-06-30 DIAGNOSIS — G4736 Sleep related hypoventilation in conditions classified elsewhere: Secondary | ICD-10-CM | POA: Insufficient documentation

## 2019-07-07 ENCOUNTER — Encounter (HOSPITAL_BASED_OUTPATIENT_CLINIC_OR_DEPARTMENT_OTHER): Payer: Self-pay | Admitting: Cardiovascular Disease

## 2019-07-07 NOTE — Procedures (Signed)
Patient Name: Richard Spence, Richard Spence Date: 06/30/2019 Gender: Male D.O.B: March 01, 1956 Age (years): 80 Referring Provider: Lavona Mound Tobb DO Height (inches): 72 Interpreting Physician: Nicki Guadalajara MD, ABSM Weight (lbs): 230 RPSGT: Rosette Reveal BMI: 31 MRN: [id number] Neck Size: 18.00  CLINICAL INFORMATION Sleep Study Type: NPSG  Indication for sleep study: Fatigue  Epworth Sleepiness Score: 14  SLEEP STUDY TECHNIQUE As per the AASM Manual for the Scoring of Sleep and Associated Events v2.3 (April 2016) with a hypopnea requiring 4% desaturations.  The channels recorded and monitored were frontal, central and occipital EEG, electrooculogram (EOG), submentalis EMG (chin), nasal and oral airflow, thoracic and abdominal wall motion, anterior tibialis EMG, snore microphone, electrocardiogram, and pulse oximetry.  MEDICATIONS apixaban (ELIQUIS) 5 MG TABS tablet Ascorbic Acid (VITAMIN C) 1000 MG tablet carvedilol (COREG) 6.25 MG tablet Cholecalciferol (VITAMIN D3) 125 MCG (5000 UT) CAPS Cyanocobalamin (VITAMIN B12 PO) diltiazem (CARDIZEM CD) 120 MG 24 hr capsule flecainide (TAMBOCOR) 50 MG tablet PARoxetine (PAXIL) 40 MG tablet Zinc 50 MG TABS  Medications self-administered by patient taken the night of the study : N/A  SLEEP ARCHITECTURE The study was initiated at 10:15:26 PM and ended at 4:29:14 AM.  Sleep onset time was 46.3 minutes and the sleep efficiency was 81.1%%. The total sleep time was 303 minutes.  Stage REM latency was 225.5 minutes.  The patient spent 4.3%% of the night in stage N1 sleep, 92.7%% in stage N2 sleep, 0.0%% in stage N3 and 3% in REM.  Alpha intrusion was absent.  Supine sleep was 59.74%.  RESPIRATORY PARAMETERS The overall apnea/hypopnea index (AHI) was 15.0 per hour.  The respiratory disturbance index (RDI) was 25.0/h. There were 4 total apneas, including 4 obstructive, 0 central and 0 mixed apneas. There were 72 hypopneas and 50  RERAs.  The AHI during Stage REM sleep was 66.7 per hour.  AHI while supine was 22.5 per hour.  The mean oxygen saturation was 90.6%. The minimum SpO2 during sleep was 85.0%.  Moderate snoring was noted during this study.  CARDIAC DATA The 2 lead EKG demonstrated sinus rhythm. The mean heart rate was 53.9 beats per minute. Other EKG findings include: None.  LEG MOVEMENT DATA The total PLMS were 0 with a resulting PLMS index of 0.0. Associated arousal with leg movement index was 0.6 .  IMPRESSIONS - Moderate obstructive sleep apnea overall (AHI 15.0/h; RDI 25.0/h); however, events were worse with supine sleep (AHI 22.5/h) and very severe during REM sleep (AHI 66.7/h). - No significant central sleep apnea occurred during this study (CAI = 0.0/h). - Mild oxygen desaturation to a nadir of 85%. - The patient snored with moderate snoring volume. - No cardiac abnormalities were noted during this study. - Clinically significant periodic limb movements did not occur during sleep. No significant associated arousals.  DIAGNOSIS - Obstructive Sleep Apnea (327.23 [G47.33 ICD-10]) - Nocturnal Hypoxemia (327.26 [G47.36 ICD-10])  RECOMMENDATIONS - Therapeutic CPAP titration to determine optimal pressure required to alleviate sleep disordered breathing. - Effort should be made to optimize nasal and oropharyngeal patency. - Positional therapy avoiding supine position during sleep. - Avoid alcohol, sedatives and other CNS depressants that may worsen sleep apnea and disrupt normal sleep architecture. - Sleep hygiene should be reviewed to assess factors that may improve sleep quality. - Weight management (BMI 31) and regular exercise should be initiated or continued if appropriate.  [Electronically signed] 07/07/2019 04:18 PM  Nicki Guadalajara MD, [facility], ABSM Diplomate, American Board of Sleep Medicine   NPI: [id number]  [place] PH: [phone]   FX: [phone] [place]

## 2019-07-10 ENCOUNTER — Telehealth: Payer: Self-pay | Admitting: *Deleted

## 2019-07-10 ENCOUNTER — Other Ambulatory Visit: Payer: Self-pay | Admitting: Cardiovascular Disease

## 2019-07-10 DIAGNOSIS — G4733 Obstructive sleep apnea (adult) (pediatric): Secondary | ICD-10-CM

## 2019-07-10 DIAGNOSIS — G4736 Sleep related hypoventilation in conditions classified elsewhere: Secondary | ICD-10-CM

## 2019-07-10 DIAGNOSIS — IMO0002 Reserved for concepts with insufficient information to code with codable children: Secondary | ICD-10-CM

## 2019-07-10 NOTE — Telephone Encounter (Signed)
Patient's wife notified of sleep study results and recommendations. She voiced understanding and asked about patient's leg movement. She agrees to proceed with scheduling CPAP titration study fir the patient.

## 2019-07-10 NOTE — Telephone Encounter (Signed)
PA for CPAP titration faxed to [facility] @ [phone].

## 2019-07-12 ENCOUNTER — Telehealth: Payer: Self-pay | Admitting: *Deleted

## 2019-07-12 NOTE — Telephone Encounter (Signed)
Patient's wife  Informed of CPAP titration and COVID appointment dates. She asked if he could get his COVID test done in [place] at [facility]. She was deferred to the sleep lab to answer this question.

## 2019-07-19 ENCOUNTER — Other Ambulatory Visit: Payer: Self-pay | Admitting: Cardiology

## 2019-07-24 ENCOUNTER — Other Ambulatory Visit (HOSPITAL_COMMUNITY): Payer: Medicare HMO

## 2019-07-26 ENCOUNTER — Encounter (HOSPITAL_BASED_OUTPATIENT_CLINIC_OR_DEPARTMENT_OTHER): Payer: Medicare HMO | Admitting: Cardiovascular Disease

## 2019-08-03 ENCOUNTER — Other Ambulatory Visit (HOSPITAL_COMMUNITY)
Admission: RE | Admit: 2019-08-03 | Discharge: 2019-08-03 | Disposition: A | Discharge status: Home / Self Care | Payer: Medicare HMO | Source: Ambulatory Visit | Attending: Cardiovascular Disease | Admitting: Cardiovascular Disease

## 2019-08-03 DIAGNOSIS — Z01812 Encounter for preprocedural laboratory examination: Secondary | ICD-10-CM | POA: Diagnosis present

## 2019-08-03 DIAGNOSIS — Z20822 Contact with and (suspected) exposure to covid-19: Secondary | ICD-10-CM | POA: Insufficient documentation

## 2019-08-03 LAB — SARS CORONAVIRUS 2 (TAT 6-24 HRS): SARS Coronavirus 2: NEGATIVE

## 2019-08-06 ENCOUNTER — Ambulatory Visit (HOSPITAL_BASED_OUTPATIENT_CLINIC_OR_DEPARTMENT_OTHER): Payer: Medicare HMO | Attending: Cardiovascular Disease | Admitting: Cardiovascular Disease

## 2019-08-06 ENCOUNTER — Other Ambulatory Visit: Payer: Self-pay

## 2019-08-06 DIAGNOSIS — G4733 Obstructive sleep apnea (adult) (pediatric): Secondary | ICD-10-CM | POA: Diagnosis not present

## 2019-08-06 DIAGNOSIS — Z7901 Long term (current) use of anticoagulants: Secondary | ICD-10-CM | POA: Insufficient documentation

## 2019-08-06 DIAGNOSIS — G4761 Periodic limb movement disorder: Secondary | ICD-10-CM | POA: Insufficient documentation

## 2019-08-06 DIAGNOSIS — G4736 Sleep related hypoventilation in conditions classified elsewhere: Secondary | ICD-10-CM

## 2019-08-06 DIAGNOSIS — Z79899 Other long term (current) drug therapy: Secondary | ICD-10-CM | POA: Insufficient documentation

## 2019-08-06 DIAGNOSIS — IMO0002 Reserved for concepts with insufficient information to code with codable children: Secondary | ICD-10-CM

## 2019-08-12 ENCOUNTER — Encounter (HOSPITAL_BASED_OUTPATIENT_CLINIC_OR_DEPARTMENT_OTHER): Payer: Self-pay | Admitting: Cardiovascular Disease

## 2019-08-12 NOTE — Procedures (Signed)
Patient Name: Richard Spence, Richard Spence Date: 08/06/2019 Gender: Male D.O.B: 1955/07/25 Age (years): 21 Referring Provider: [name] MD, ABSM Height (inches): 72 Interpreting Physician: [name] MD, ABSM Weight (lbs): 220 RPSGT: Carolin Coy BMI: 30 MRN: [id number] Neck Size: 16.50  CLINICAL INFORMATION The patient is referred for a CPAP titration to treat sleep apnea.  Date of NPSG: 06/30/2019:  AHI 15.0; RDI 25.0/h; REM AHI 66.7/h; supine AHI 22.5/h; O2 nadir 85%; moderate snoring.  SLEEP STUDY TECHNIQUE As per the AASM Manual for the Scoring of Sleep and Associated Events v2.3 (April 2016) with a hypopnea requiring 4% desaturations.  The channels recorded and monitored were frontal, central and occipital EEG, electrooculogram (EOG), submentalis EMG (chin), nasal and oral airflow, thoracic and abdominal wall motion, anterior tibialis EMG, snore microphone, electrocardiogram, and pulse oximetry. Continuous positive airway pressure (CPAP) was initiated at the beginning of the study and titrated to treat sleep-disordered breathing.  MEDICATIONS apixaban (ELIQUIS) 5 MG TABS tablet Ascorbic Acid (VITAMIN C) 1000 MG tablet carvedilol (COREG) 6.25 MG tablet Cholecalciferol (VITAMIN D3) 125 MCG (5000 UT) CAPS Cyanocobalamin (VITAMIN B12 PO) diltiazem (CARDIZEM CD) 120 MG 24 hr capsule flecainide (TAMBOCOR) 50 MG tablet PARoxetine (PAXIL) 40 MG tablet Zinc 50 MG TABS  Medications self-administered by patient taken the night of the study : N/A  TECHNICIAN COMMENTS Comments added by technician: PATIENT WAS ORDERED AS A CPAP TITRATION. Comments added by scorer: N/A  RESPIRATORY PARAMETERS Optimal PAP Pressure (cm): 15 AHI at Optimal Pressure (/hr): 0.0 Overall Minimal O2 (%): 89.0 Supine % at Optimal Pressure (%): 0 Minimal O2 at Optimal Pressure (%): 93.0   SLEEP ARCHITECTURE The study was initiated at 11:07:30 PM and ended at 5:05:40 AM.  Sleep onset time was  53.6 minutes and the sleep efficiency was 78.0%%. The total sleep time was 279.5 minutes.  The patient spent 8.6%% of the night in stage N1 sleep, 91.4%% in stage N2 sleep, 0.0%% in stage N3 and 0% in REM.Stage REM latency was N/A minutes  Wake after sleep onset was 25.1. Alpha intrusion was absent. Supine sleep was 19.50%.  CARDIAC DATA The 2 lead EKG demonstrated sinus rhythm. The mean heart rate was 46.4 beats per minute. Other EKG findings include: None.  LEG MOVEMENT DATA The total Periodic Limb Movements of Sleep (PLMS) were 0. The PLMS index was 0.0. A PLMS index of <15 is considered normal in adults.  IMPRESSIONS - CPAP was started at 5 cm and was titrated to optimal PAP pressure at15 cm of water; AHI 0/h; RDI 1.2/h; O2 nadir 93%. - Central sleep apnea was not noted during this titration (CAI = 0.0/h). - Mild oxygen desaturations to a nadir of 89.0% at 7 cm. - The patient snored with soft snoring volume during this titration study. - No cardiac abnormalities were observed during this study. - Clinically significant periodic limb movements were not noted during this study. Arousals associated with PLMs were significant.  DIAGNOSIS - Obstructive Sleep Apnea (327.23 [G47.33 ICD-10]) - Periodic Limb Movement During Sleep (327.51 [G47.61 ICD-10])  RECOMMENDATIONS - Recommend an initial trial of CPAP therapy with EPR at 15 cm H2O with heated humidification.  A Medium size Fisher[employer]Paykel Full Face Mask Simplus mask was used for thte titration. - Effort should be made to optimize nasal and pharygeal patency.  - Avoid alcohol, sedatives and other CNS depressants that may worsen sleep apnea and disrupt normal sleep architecture. - Sleep hygiene should be reviewed to assess factors that may improve  sleep quality. - Weight management and regular exercise should be initiated or continued. - Recommend a download in 30 days and sleep clinic evaluation after 4 weeks of  therapy.   [Electronically signed] 08/12/2019 04:42 PM  Nicki Guadalajara MD, [facility], ABSM Diplomate, American Board of Sleep Medicine   NPI: [id number] [facility] PH: [phone]   FX: [phone] ACCREDITED BY THE AMERICAN ACADEMY OF SLEEP MEDICINE

## 2019-08-13 ENCOUNTER — Telehealth: Payer: Self-pay | Admitting: *Deleted

## 2019-08-13 NOTE — Telephone Encounter (Signed)
Patient's wife informed sleep study has been completed. CPAP order has been sent to [facility] in [place]. Contact # [phone]. Fax # [phone].

## 2019-09-12 ENCOUNTER — Other Ambulatory Visit: Payer: Self-pay

## 2019-09-16 ENCOUNTER — Other Ambulatory Visit: Payer: Self-pay

## 2019-09-16 ENCOUNTER — Encounter: Payer: Self-pay | Admitting: Cardiology

## 2019-09-16 ENCOUNTER — Ambulatory Visit (INDEPENDENT_AMBULATORY_CARE_PROVIDER_SITE_OTHER): Payer: Medicare HMO | Admitting: Cardiology

## 2019-09-16 VITALS — BP 118/72 | HR 50 | Temp 97.2°F | Ht 72.0 in | Wt 218.6 lb

## 2019-09-16 DIAGNOSIS — E782 Mixed hyperlipidemia: Secondary | ICD-10-CM

## 2019-09-16 DIAGNOSIS — R7303 Prediabetes: Secondary | ICD-10-CM

## 2019-09-16 DIAGNOSIS — I480 Paroxysmal atrial fibrillation: Secondary | ICD-10-CM

## 2019-09-16 DIAGNOSIS — R5383 Other fatigue: Secondary | ICD-10-CM | POA: Diagnosis not present

## 2019-09-16 DIAGNOSIS — R079 Chest pain, unspecified: Secondary | ICD-10-CM | POA: Diagnosis not present

## 2019-09-16 NOTE — Progress Notes (Signed)
Cardiology Office Note:    Date:  09/16/2019   ID:  Richard Spence, DOB 03/31/1956, MRN [id number]  PCP:  [name], MD  Cardiologist:  [name], MD    Referring MD: [name], MD   No chief complaint on file. Doing well  History of Present Illness:    Richard Spence is a 64 y.o. male with past medical history significant for paroxysmal defibrillation, remote history of cardiomyopathy with normalization of left ventricle ejection fraction, comes today 2 months of follow-up overall doing well rhythm wise.  He denies having any palpitations, no dizziness no passing out.  He described however to have some chest pain he describes as tightness it can happen at any time sometimes with exercise.  Can last for few minutes.  Obviously very concerning.  Past Medical History:  Diagnosis Date  . Altered mental status 10/25/2018  . Atrial fibrillation with rapid ventricular response (HCC) 10/08/2018  . Cardiomyopathy (HCC) 10/25/2018   EF 25-30% on ST. 40% on ECHO 2020.  LVH  . Degeneration of lumbar intervertebral disc 07/30/2015  . DOE (dyspnea on exertion) 10/16/2018  . Fatigue 06/06/2019  . Hearing loss 10/15/2018  . High risk medication use 07/30/2015  . Long-term use of aspirin therapy 10/15/2018  . Malaise and fatigue 10/25/2018  . Mild cognitive impairment 12/10/2018   Formatting of this note might be different from the original. Okay on eval  . 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   Last Assessment & Plan:  Relevant Hx: Course: Daily Update: Today's Plan:will refill his med for him today and he is stable with this  Electronically signed by: [name], FNP 07/30/15 [phone]  . Mixed hyperlipidemia 07/30/2015  . Paroxysmal atrial fibrillation (HCC) 05/06/2019  . Permanent atrial fibrillation (HCC) 10/08/2018  . Persistent atrial fibrillation, status post cardioversion in July 2020 about flip back to atrial fibrillation a few weeks later  01/21/2019  . Postural dizziness with presyncope 10/25/2018  . Prediabetes 07/30/2015  . Right shoulder injury 10/08/2018  . S/P arthroscopy of right shoulder 03/12/2019  . S/P rotator cuff repair 03/12/2019  . Tear of right supraspinatus tendon 10/25/2018  . Tremor 07/30/2015  . Vitamin B 12 deficiency 07/30/2015  . White matter disease, unspecified 11/19/2018   Noted on MRI of brain 2020. Discussed with pt and wife.    Past Surgical History:  Procedure Laterality Date  . BACK SURGERY    . CHOLECYSTECTOMY    . HERNIA REPAIR  2009  . ROTATOR CUFF REPAIR  03/07/2019  . SHOULDER SURGERY Left 12/2010   Rotater cuff  . SPINAL FUSION  1998   Dr Nadene Rubins  . SPINE SURGERY  2001   Spinal hardware removal by Dr. Lilian Kapur    Current Medications: Current Meds  Medication Sig  . apixaban (ELIQUIS) 5 MG TABS tablet Take by mouth 2 (two) times daily.   . Ascorbic Acid (VITAMIN C) 1000 MG tablet Take 1,000 mg by mouth daily.  . carvedilol (COREG) 6.25 MG tablet TAKE 1 TABLET BY MOUTH TWICE (2) DAILY  . Cholecalciferol (VITAMIN D3) 125 MCG (5000 UT) CAPS Take 1 capsule by mouth daily.  . Cyanocobalamin (VITAMIN B12 PO) Take by mouth daily.  [name] diltiazem (CARDIZEM CD) 120 MG 24 hr capsule Take by mouth daily.  . flecainide (TAMBOCOR) 50 MG tablet TAKE 1 TABLET BY MOUTH TWICE (2) DAILY  . oxyCODONE (OXY IR/ROXICODONE) 5 MG immediate release tablet Take 5 mg by mouth  every 4 (four) hours as needed.  [name] PARoxetine (PAXIL) 40 MG tablet Take by mouth every morning.  . Zinc 50 MG TABS Take 1 tablet by mouth daily.     Allergies:   Patient has no known allergies.   Social History   Socioeconomic History  . Marital status: Married    Spouse name: Not on file  . Number of children: Not on file  . Years of education: Not on file  . Highest education level: Not on file  Occupational History  . Not on file  Tobacco Use  . Smoking status: Never Smoker  . Smokeless tobacco: Never Used  Substance and Sexual  Activity  . Alcohol use: Yes    Comment: once in a while  . Drug use: Not Currently  . Sexual activity: Not on file  Other Topics Concern  . Not on file  Social History Narrative  . Not on file   Social Determinants of Health   Financial Resource Strain:   . Difficulty of Paying Living Expenses:   Food Insecurity:   . Worried About Charity fundraiser in the Last Year:   . Arboriculturist in the Last Year:   Transportation Needs:   . Film/video editor (Medical):   [name] Lack of Transportation (Non-Medical):   Physical Activity:   . Days of Exercise per Week:   . Minutes of Exercise per Session:   Stress:   . Feeling of Stress :   Social Connections:   . Frequency of Communication with Friends and Family:   . Frequency of Social Gatherings with Friends and Family:   . Attends Religious Services:   . Active Member of Clubs or Organizations:   . Attends Archivist Meetings:   [name] Marital Status:      Family History: The patient's family history includes Breast cancer in his mother; Dementia in his maternal grandmother and mother; Hypertension in his father and mother; Multiple sclerosis in his son; Prostate cancer in his maternal grandfather. ROS:   Please see the history of present illness.    All 14 point review of systems negative except as described per history of present illness  EKGs/Labs/Other Studies Reviewed:      Recent Labs: 04/22/2019: BUN 22; Creatinine, Ser 1.15; Hemoglobin 17.4; Magnesium 2.4; Platelets 270; Potassium 4.5; Sodium 142 06/06/2019: TSH 1.030  Recent Lipid Panel No results found for: CHOL, TRIG, HDL, CHOLHDL, VLDL, LDLCALC, LDLDIRECT  Physical Exam:    VS:  BP 118/72   Pulse (!) 50   Temp (!) 97.2 F (36.2 C)   Ht 6' (1.829 m)   Wt 218 lb 9.6 oz (99.2 kg)   SpO2 98%   BMI 29.65 kg/m     Wt Readings from Last 3 Encounters:  09/16/19 218 lb 9.6 oz (99.2 kg)  08/06/19 220 lb (99.8 kg)  06/30/19 230 lb (104.3 kg)      GEN:  Well nourished, well developed in no acute distress HEENT: Normal NECK: No JVD; No carotid bruits LYMPHATICS: No lymphadenopathy CARDIAC: RRR, no murmurs, no rubs, no gallops RESPIRATORY:  Clear to auscultation without rales, wheezing or rhonchi  ABDOMEN: Soft, non-tender, non-distended MUSCULOSKELETAL:  No edema; No deformity  SKIN: Warm and dry LOWER EXTREMITIES: no swelling NEUROLOGIC:  Alert and oriented x 3 PSYCHIATRIC:  Normal affect   ASSESSMENT:    1. Paroxysmal atrial fibrillation (HCC)   2. Prediabetes   3. Mixed hyperlipidemia   4. Fatigue, unspecified type  PLAN:    In order of problems listed above:  1. Paroxysmal atrial fibrillation.  His EKG today showing sinus bradycardia rate of 50.  Maintaining sinus rhythm.  Continue anticoagulation he is on Eliquis.  Continue with Pulmicort for now.  Obviously with his symptoms of chest pain that is very concerning I will schedule him to have a stress test to rule out any inducible ischemia.  If he does have inducible ischemia then would pursue cardiac catheterization. 2. Prediabetes, but being followed by internal medicine team.  Apparently stable. 3. Mixed dyslipidemia, his LDL is 148, HDL of 46.  We had again discussion about cholesterol-lowering medication.  He is wife purchased some bars that symptoms along with his cholesterol and try to do that.  I told him that most likely will not be sufficient.  We will repeat fasting profile in the next few months to recheck and then decision will be made regarding treatment.   Medication Adjustments/Labs and Tests Ordered: Current medicines are reviewed at length with the patient today.  Concerns regarding medicines are outlined above.  No orders of the defined types were placed in this encounter.  Medication changes: No orders of the defined types were placed in this encounter.   Signed, Georgeanna Lea, MD, [facility] 09/16/2019 11:16 AM    [facility]

## 2019-09-16 NOTE — Patient Instructions (Signed)
Medication Instructions:  Your physician recommends that you continue on your current medications as directed. Please refer to the Current Medication list given to you today.  *If you need a refill on your cardiac medications before your next appointment, please call your pharmacy*   Lab Work: None.  If you have labs (blood work) drawn today and your tests are completely normal, you will receive your results only by: [name] MyChart Message (if you have MyChart) OR . A paper copy in the mail If you have any lab test that is abnormal or we need to change your treatment, we will call you to review the results.   Testing/Procedures:   [facility] [address] [place], [place] [address] Phone:  [phone]    Please arrive 15 minutes prior to your appointment time for registration and insurance purposes.  The test will take approximately 3 to 4 hours to complete; you may bring reading material.  If someone comes with you to your appointment, they will need to remain in the main lobby due to limited space in the testing area. **If you are pregnant or breastfeeding, please notify the nuclear lab prior to your appointment**  How to prepare for your Myocardial Perfusion Test: . Do not eat or drink 3 hours prior to your test, except you may have water. . Do not consume products containing caffeine (regular or decaffeinated) 12 hours prior to your test. (ex: coffee, chocolate, sodas, tea). Do bring a list of your current medications with you.  If not listed below, you may take your medications as normal. . Do not take carvedilol (Coreg) for 24 hours prior to the test.  Bring the medication to your appointment as you may be required to take it once the test is complete. . Do wear comfortable clothes (no dresses or overalls) and walking shoes, tennis shoes preferred (No heels or open toe shoes are allowed). . Do NOT wear cologne, perfume, aftershave, or lotions  (deodorant is allowed). . If these instructions are not followed, your test will have to be rescheduled.  Please report to [address] for your test.  If you have questions or concerns about your appointment, you can call the [facility] at [phone].  If you cannot keep your appointment, please provide 24 hours notification to the Nuclear Lab, to avoid a possible $50 charge to your account.    Follow-Up: At [facility], you and your health needs are our priority.  As part of our continuing mission to provide you with exceptional heart care, we have created designated Provider Care Teams.  These Care Teams include your primary Cardiologist (physician) and Advanced Practice Providers (APPs -  Physician Assistants and Nurse Practitioners) who all work together to provide you with the care you need, when you need it.  We recommend signing up for the patient portal called "MyChart".  Sign up information is provided on this After Visit Summary.  MyChart is used to connect with patients for Virtual Visits (Telemedicine).  Patients are able to view lab/test results, encounter notes, upcoming appointments, etc.  Non-urgent messages can be sent to your provider as well.   To learn more about what you can do with MyChart, go to [email or web address].    Your next appointment:   5 month(s)  The format for your next appointment:   In Person  Provider:   [name], MD   Other Instructions   Cardiac Nuclear Scan A cardiac  nuclear scan is a test that measures blood flow to the heart when a person is resting and when he or she is exercising. The test looks for problems such as:  Not enough blood reaching a portion of the heart.  The heart muscle not working normally. You may need this test if:  You have heart disease.  You have had abnormal lab results.  You have had heart surgery or a balloon procedure to open up blocked arteries  (angioplasty).  You have chest pain.  You have shortness of breath. In this test, a radioactive dye (tracer) is injected into your bloodstream. After the tracer has traveled to your heart, an imaging device is used to measure how much of the tracer is absorbed by or distributed to various areas of your heart. This procedure is usually done at a hospital and takes 2-4 hours. Tell a health care provider about:  Any allergies you have.  All medicines you are taking, including vitamins, herbs, eye drops, creams, and over-the-counter medicines.  Any problems you or family members have had with anesthetic medicines.  Any blood disorders you have.  Any surgeries you have had.  Any medical conditions you have.  Whether you are pregnant or may be pregnant. What are the risks? Generally, this is a safe procedure. However, problems may occur, including:  Serious chest pain and heart attack. This is only a risk if the stress portion of the test is done.  Rapid heartbeat.  Sensation of warmth in your chest. This usually passes quickly.  Allergic reaction to the tracer. What happens before the procedure?  Ask your health care provider about changing or stopping your regular medicines. This is especially important if you are taking diabetes medicines or blood thinners.  Follow instructions from your health care provider about eating or drinking restrictions.  Remove your jewelry on the day of the procedure. What happens during the procedure?  An IV will be inserted into one of your veins.  Your health care provider will inject a small amount of radioactive tracer through the IV.  You will wait for 20-40 minutes while the tracer travels through your bloodstream.  Your heart activity will be monitored with an electrocardiogram (ECG).  You will lie down on an exam table.  Images of your heart will be taken for about 15-20 minutes.  You may also have a stress test. For this test, one  of the following may be done: ? You will exercise on a treadmill or stationary bike. While you exercise, your heart's activity will be monitored with an ECG, and your blood pressure will be checked. ? You will be given medicines that will increase blood flow to parts of your heart. This is done if you are unable to exercise.  When blood flow to your heart has peaked, a tracer will again be injected through the IV.  After 20-40 minutes, you will get back on the exam table and have more images taken of your heart.  Depending on the type of tracer used, scans may need to be repeated 3-4 hours later.  Your IV line will be removed when the procedure is over. The procedure may vary among health care providers and hospitals. What happens after the procedure?  Unless your health care provider tells you otherwise, you may return to your normal schedule, including diet, activities, and medicines.  Unless your health care provider tells you otherwise, you may increase your fluid intake. This will help to flush the contrast  dye from your body. Drink enough fluid to keep your urine pale yellow.  Ask your health care provider, or the department that is doing the test: ? When will my results be ready? ? How will I get my results? Summary  A cardiac nuclear scan measures the blood flow to the heart when a person is resting and when he or she is exercising.  Tell your health care provider if you are pregnant.  Before the procedure, ask your health care provider about changing or stopping your regular medicines. This is especially important if you are taking diabetes medicines or blood thinners.  After the procedure, unless your health care provider tells you otherwise, increase your fluid intake. This will help flush the contrast dye from your body.  After the procedure, unless your health care provider tells you otherwise, you may return to your normal schedule, including diet, activities, and  medicines. This information is not intended to replace advice given to you by your health care provider. Make sure you discuss any questions you have with your health care provider. Document Revised: 11/06/2017 Document Reviewed: 11/06/2017 Elsevier Patient Education  2020 [employer].

## 2019-09-18 ENCOUNTER — Other Ambulatory Visit: Payer: Self-pay | Admitting: Cardiology

## 2019-09-18 ENCOUNTER — Telehealth: Payer: Self-pay

## 2019-09-18 NOTE — Telephone Encounter (Signed)
Orders for sleep equipment signed on 09/11/2019 and faxed back to [facility].

## 2019-09-25 ENCOUNTER — Telehealth: Payer: Self-pay | Admitting: *Deleted

## 2019-09-25 ENCOUNTER — Encounter: Payer: Self-pay | Admitting: *Deleted

## 2019-09-25 NOTE — Telephone Encounter (Signed)
Left message on voicemail per DPR in reference to upcoming appointment scheduled on 10/01/2019 at 0800 with detailed instructions given per Myocardial Perfusion Study Information Sheet for the test. LM to arrive 15 minutes early, and that it is imperative to arrive on time for appointment to keep from having the test rescheduled. If you need to cancel or reschedule your appointment, please call the office within 24 hours of your appointment. Failure to do so may result in a cancellation of your appointment, and a $50 no show fee. Phone number given for call back for any questions.   Mychart letter sent with instructions.Richard Spence, Adelene Idler

## 2019-10-01 ENCOUNTER — Ambulatory Visit (INDEPENDENT_AMBULATORY_CARE_PROVIDER_SITE_OTHER): Payer: Medicare HMO

## 2019-10-01 ENCOUNTER — Other Ambulatory Visit: Payer: Self-pay

## 2019-10-01 VITALS — Ht 72.0 in | Wt 218.0 lb

## 2019-10-01 DIAGNOSIS — R079 Chest pain, unspecified: Secondary | ICD-10-CM | POA: Diagnosis not present

## 2019-10-01 DIAGNOSIS — R0609 Other forms of dyspnea: Secondary | ICD-10-CM

## 2019-10-01 DIAGNOSIS — R0600 Dyspnea, unspecified: Secondary | ICD-10-CM

## 2019-10-01 DIAGNOSIS — I480 Paroxysmal atrial fibrillation: Secondary | ICD-10-CM

## 2019-10-01 LAB — MYOCARDIAL PERFUSION IMAGING
LV dias vol: 128 mL (ref 62–150)
LV sys vol: 63 mL
Peak HR: 63 {beats}/min
Rest HR: 45 {beats}/min
SDS: 1
SRS: 4
SSS: 5
TID: 1.12

## 2019-10-01 MED ORDER — REGADENOSON 0.4 MG/5ML IV SOLN
0.4000 mg | Freq: Once | INTRAVENOUS | Status: AC
  Administered 2019-10-01: 0.4 mg via INTRAVENOUS

## 2019-10-01 MED ORDER — TECHNETIUM TC 99M TETROFOSMIN IV KIT
32.6000 | PACK | Freq: Once | INTRAVENOUS | Status: AC | PRN
  Administered 2019-10-01: 32.6 via INTRAVENOUS

## 2019-10-01 MED ORDER — TECHNETIUM TC 99M TETROFOSMIN IV KIT
10.6000 | PACK | Freq: Once | INTRAVENOUS | Status: AC | PRN
  Administered 2019-10-01: 10.6 via INTRAVENOUS

## 2019-10-02 ENCOUNTER — Telehealth: Payer: Self-pay

## 2019-10-02 NOTE — Telephone Encounter (Signed)
Spoke with patients wife regarding results and recommendation.  She verbalizes understanding and is agreeable to plan of care. Advised patient/wife to call back with any issues or concerns.   Patient is now scheduled to see Dr. Bing Matter on 10/21/19.

## 2019-10-02 NOTE — Telephone Encounter (Signed)
-----   Message from Georgeanna Lea, MD sent at 10/02/2019  3:30 PM EDT ----- Stress test abnormal, needs follow-up but can be done within the next few weeks no rash

## 2019-10-10 ENCOUNTER — Telehealth: Payer: Self-pay | Admitting: Cardiovascular Disease

## 2019-10-10 NOTE — Telephone Encounter (Signed)
Called and spoke with pt's wife. Notified Dr.Kelly is placing himself in office a few days the week of 5/17 and as soon as they are finalized we will get her husband scheduled. Wife verbalized understanding with no other questions at this time.

## 2019-10-10 NOTE — Telephone Encounter (Signed)
Patient's wife is requesting to schedule CPAP follow up appointment with Dr. Tresa Endo. She states the patient must been seen prior to 11/05/19 in order for insurance to approve. However I made her aware that Dr. Tresa Endo does not have any availability prior to 11/05/19. Please call.

## 2019-10-11 NOTE — Telephone Encounter (Signed)
Called and spoke with pts wife, able to schedule husband for appt with Dr.Kelly on 10/24/19 at 1:20pm Wife verbalized understanding with no other questions at this time.

## 2019-10-17 ENCOUNTER — Other Ambulatory Visit: Payer: Self-pay | Admitting: Cardiology

## 2019-10-17 MED ORDER — FLECAINIDE ACETATE 50 MG PO TABS: ORAL_TABLET | ORAL | 1 refills | Status: DC

## 2019-10-17 NOTE — Telephone Encounter (Signed)
*  STAT* If patient is at the pharmacy, call can be transferred to refill team.   1. Which medications need to be refilled? (please list name of each medication and dose if known) flecainide (TAMBOCOR) 50 MG tablet  2. Which pharmacy/location (including street and city if local pharmacy) is medication to be sent to? [facility] [address] [facility] - [place], [place] - [address]  3. Do they need a 30 day or 90 day supply? 30

## 2019-10-17 NOTE — Telephone Encounter (Signed)
Refill sent in per request.  

## 2019-10-18 ENCOUNTER — Other Ambulatory Visit: Payer: Self-pay

## 2019-10-21 ENCOUNTER — Ambulatory Visit (HOSPITAL_BASED_OUTPATIENT_CLINIC_OR_DEPARTMENT_OTHER)
Admission: RE | Admit: 2019-10-21 | Discharge: 2019-10-21 | Disposition: A | Discharge status: Home / Self Care | Payer: Medicare HMO | Source: Ambulatory Visit | Attending: Cardiology | Admitting: Cardiology

## 2019-10-21 ENCOUNTER — Ambulatory Visit: Payer: Medicare HMO | Admitting: Cardiology

## 2019-10-21 ENCOUNTER — Encounter: Payer: Self-pay | Admitting: Cardiology

## 2019-10-21 ENCOUNTER — Other Ambulatory Visit: Payer: Self-pay

## 2019-10-21 VITALS — BP 122/80 | HR 49 | Ht 72.0 in | Wt 219.0 lb

## 2019-10-21 DIAGNOSIS — E782 Mixed hyperlipidemia: Secondary | ICD-10-CM | POA: Diagnosis not present

## 2019-10-21 DIAGNOSIS — R9439 Abnormal result of other cardiovascular function study: Secondary | ICD-10-CM

## 2019-10-21 DIAGNOSIS — I480 Paroxysmal atrial fibrillation: Secondary | ICD-10-CM | POA: Diagnosis not present

## 2019-10-21 HISTORY — DX: Abnormal result of other cardiovascular function study: R94.39

## 2019-10-21 NOTE — H&P (View-Only) (Signed)
Cardiology Office Note:    Date:  10/21/2019   ID:  Richard Spence, DOB 1956/04/02, MRN [id number]  PCP:  [name], MD  Cardiologist:  [name], MD    Referring MD: [name], MD   Chief Complaint  Patient presents with  . Follow-up  I have abnormal stress test  History of Present Illness:    Richard Spence is a 64 y.o. male with past medical history significant for paroxysmal atrial fibrillation, status post recent cardioversion, maintaining sinus rhythm with flecainide, anticoagulated, also history of cardiomyopathy years ago with ejection fraction down to 30% however latest echocardiogram showed improvement left ventricle ejection fraction.  He still complains of having some shortness of breath and fatigue as well as chest pain.  Stress test done showed ischemia involving inferior wall he comes today to my office to talk about options for this situation.  We did talk about either medical therapy or cardiac catheterization.  He as well as his wife who with him in the office agreed to have a cardiac catheterization.  Procedure explained to him including all risk benefits as well as alternatives.  He agreed to proceed situation is very more important because he is on flecainide to maintain the sinus rhythm which is contraindicated in people with coronary artery disease.  On top of that he does take carvedilol with resting bradycardia right now and I would like to come down this medication however I am prefer not to do it until I have situation of coronary artery disease clarified.  Past Medical History:  Diagnosis Date  . Altered mental status 10/25/2018  . Atrial fibrillation with rapid ventricular response (HCC) 10/08/2018  . Cardiomyopathy (HCC) 10/25/2018   EF 25-30% on ST. 40% on ECHO 2020.  LVH  . Degeneration of lumbar intervertebral disc 07/30/2015  . DOE (dyspnea on exertion) 10/16/2018  . Fatigue 06/06/2019  . Hearing loss 10/15/2018  . High risk medication  use 07/30/2015  . Long-term use of aspirin therapy 10/15/2018  . Malaise and fatigue 10/25/2018  . Mild cognitive impairment 12/10/2018   Formatting of this note might be different from the original. Okay on eval  . 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 Mixed hyperlipidemia 07/30/2015  . Paroxysmal atrial fibrillation (HCC) 05/06/2019  . Permanent atrial fibrillation (HCC) 10/08/2018  . Persistent atrial fibrillation, status post cardioversion in July 2020 about flip back to atrial fibrillation a few weeks later 01/21/2019  . Postural dizziness with presyncope 10/25/2018  . Prediabetes 07/30/2015  . Right shoulder injury 10/08/2018  . S/P arthroscopy of right shoulder 03/12/2019  . S/P rotator cuff repair 03/12/2019  . Tear of right supraspinatus tendon 10/25/2018  . Tremor 07/30/2015  . Vitamin B 12 deficiency 07/30/2015  . White matter disease, unspecified 11/19/2018   Noted on MRI of brain 2020. Discussed with pt and wife.    Past Surgical History:  Procedure Laterality Date  . BACK SURGERY    . CHOLECYSTECTOMY    . HERNIA REPAIR  2009  . ROTATOR CUFF REPAIR  03/07/2019  . SHOULDER SURGERY Left 12/2010   Rotater cuff  . SPINAL FUSION  1998   Dr Nadene Rubins  . SPINE SURGERY  2001   Spinal hardware removal by Dr. Lilian Kapur  Current Medications: Current Meds  Medication Sig  . apixaban (ELIQUIS) 5 MG TABS tablet Take by mouth 2 (two) times daily.   . carvedilol (COREG) 6.25 MG tablet TAKE 1 TABLET BY MOUTH TWICE (2) DAILY  . Cholecalciferol (VITAMIN D3) 125 MCG (5000 UT) CAPS Take 1 capsule by mouth daily.  . Cyanocobalamin (VITAMIN B12 PO) Take by mouth daily.  [name] diltiazem (CARDIZEM CD) 120 MG 24 hr capsule Take by mouth daily.  . flecainide (TAMBOCOR) 50 MG  tablet TAKE 1 TABLET BY MOUTH TWICE (2) DAILY  . oxyCODONE (OXY IR/ROXICODONE) 5 MG immediate release tablet Take 5 mg by mouth every 4 (four) hours as needed.  [name] PARoxetine (PAXIL) 40 MG tablet Take by mouth every morning.     Allergies:   Patient has no known allergies.   Social History   Socioeconomic History  . Marital status: Married    Spouse name: Not on file  . Number of children: Not on file  . Years of education: Not on file  . Highest education level: Not on file  Occupational History  . Not on file  Tobacco Use  . Smoking status: Never Smoker  . Smokeless tobacco: Never Used  Substance and Sexual Activity  . Alcohol use: Yes    Comment: once in a while  . Drug use: Not Currently  . Sexual activity: Not on file  Other Topics Concern  . Not on file  Social History Narrative  . Not on file   Social Determinants of Health   Financial Resource Strain:   . Difficulty of Paying Living Expenses:   Food Insecurity:   . Worried About Charity fundraiser in the Last Year:   . Arboriculturist in the Last Year:   Transportation Needs:   . Film/video editor (Medical):   [name] Lack of Transportation (Non-Medical):   Physical Activity:   . Days of Exercise per Week:   . Minutes of Exercise per Session:   Stress:   . Feeling of Stress :   Social Connections:   . Frequency of Communication with Friends and Family:   . Frequency of Social Gatherings with Friends and Family:   . Attends Religious Services:   . Active Member of Clubs or Organizations:   . Attends Archivist Meetings:   [name] Marital Status:      Family History: The patient's family history includes Breast cancer in his mother; Dementia in his maternal grandmother and mother; Hypertension in his father and mother; Multiple sclerosis in his son; Prostate cancer in his maternal grandfather. ROS:   Please see the history of present illness.    All 14 point review of systems negative except as  described per history of present illness  EKGs/Labs/Other Studies Reviewed:      Recent Labs: 04/22/2019: BUN 22; Creatinine, Ser 1.15; Hemoglobin 17.4; Magnesium 2.4; Platelets 270; Potassium 4.5; Sodium 142 06/06/2019: TSH 1.030  Recent Lipid Panel No results found for: CHOL, TRIG, HDL, CHOLHDL, VLDL, LDLCALC, LDLDIRECT  Physical Exam:    VS:  BP 122/80   Pulse (!) 49   Ht 6' (1.829 m)   Wt 219 lb (99.3 kg)   SpO2 97%   BMI 29.70 kg/m     Wt Readings from Last 3 Encounters:  10/21/19 219 lb (99.3 kg)  10/01/19 218 lb (98.9 kg)  09/16/19 218 lb 9.6 oz (99.2 kg)     GEN:  Well nourished, well developed in no acute distress  HEENT: Normal NECK: No JVD; No carotid bruits LYMPHATICS: No lymphadenopathy CARDIAC: RRR, no murmurs, no rubs, no gallops RESPIRATORY:  Clear to auscultation without rales, wheezing or rhonchi  ABDOMEN: Soft, non-tender, non-distended MUSCULOSKELETAL:  No edema; No deformity  SKIN: Warm and dry LOWER EXTREMITIES: no swelling NEUROLOGIC:  Alert and oriented x 3 PSYCHIATRIC:  Normal affect   ASSESSMENT:    1. Abnormal stress test   2. Paroxysmal atrial fibrillation (HCC)   3. Mixed hyperlipidemia    PLAN:    In order of problems listed above:  1. Abnormal stress test inferior wall ischemia plan is to pursue cardiac catheterization procedure explained: Risk benefits as well as alternatives we will proceed. 2. Paroxysmal atrial fibrillation maintaining sinus rhythm continue anticoagulation as well as flecainide for now.  If cardiac cath showed significant coronary artery disease flecainide will be discontinued 3. Mixed dyslipidemia continue statin   Medication Adjustments/Labs and Tests Ordered: Current medicines are reviewed at length with the patient today.  Concerns regarding medicines are outlined above.  Orders Placed This Encounter  Procedures  . DG Chest 2 View  . Basic metabolic panel  . CBC  . EKG 12-Lead   Medication changes:  No orders of the defined types were placed in this encounter.   Signed, Victoria Euceda J. Tony Granquist, MD, FACC 10/21/2019 10:55 AM    [facility] 

## 2019-10-21 NOTE — Patient Instructions (Addendum)
Medication Instructions:  Your physician recommends that you continue on your current medications as directed. Please refer to the Current Medication list given to you today.  *If you need a refill on your cardiac medications before your next appointment, please call your pharmacy*   Lab Work: Your physician recommends that you return for lab work today: bmp, cbc  If you have labs (blood work) drawn today and your tests are completely normal, you will receive your results only by: [name] MyChart Message (if you have MyChart) OR . A paper copy in the mail If you have any lab test that is abnormal or we need to change your treatment, we will call you to review the results.   Testing/Procedures: A chest x-ray takes a picture of the organs and structures inside the chest, including the heart, lungs, and blood vessels. This test can show several things, including, whether the heart is enlarges; whether fluid is building up in the lungs; and whether pacemaker / defibrillator leads are still in place.     [facility] [place] [address], [address] [place] [address] Dept: [phone] Loc: [phone]  Richard Spence  10/21/2019  You are scheduled for a Cardiac Catheterization on Friday, May 21 with Dr. Peter [place].  1. Please arrive at the [facility]Main Entr[facility] A) at [facility]: [address] [place], [place] [address] at 10:00 AM (This time is two hours before your procedure to ensure your preparation). Free valet parking service is available.   Special note: Every effort is made to have your procedure done on time. Please understand that emergencies sometimes delay scheduled procedures.  2. Diet: Do not eat solid foods after midnight.  The patient may have clear liquids until 5am upon the day of the procedure.  3. Labs: You will have labs drawn today.   4. Medication instructions in  preparation for your procedure:      Stop taking Eliquis (Apixiban) on Wednesday, May 19.    On the morning of your procedure, take your Aspirin and any morning medicines NOT listed above.  You may use sips of water.  5. Plan for one night stay--bring personal belongings. 6. Bring a current list of your medications and current insurance cards. 7. You MUST have a responsible person to drive you home. 8. Someone MUST be with you the first 24 hours after you arrive home or your discharge will be delayed. 9. Please wear clothes that are easy to get on and off and wear slip-on shoes.  Thank you for allowing [place] to care for you!   --  Invasive Cardiovascular services    Follow-Up: At [facility], you and your health needs are our priority.  As part of our continuing mission to provide you with exceptional heart care, we have created designated Provider Care Teams.  These Care Teams include your primary Cardiologist (physician) and Advanced Practice Providers (APPs -  Physician Assistants and Nurse Practitioners) who all work together to provide you with the care you need, when you need it.  We recommend signing up for the patient portal called "MyChart".  Sign up information is provided on this After Visit Summary.  MyChart is used to connect with patients for Virtual Visits (Telemedicine).  Patients are able to view lab/test results, encounter notes, upcoming appointments, etc.  Non-urgent messages can be sent to your provider as well.   To learn more about what  you can do with MyChart, go to [email or web address].    Your next appointment:   1 month(s)  The format for your next appointment:   In Person  Provider:   [name], MD   Other Instructions   Coronary Angiogram With Stent Coronary angiogram with stent placement is a procedure to widen or open a narrow blood vessel of the heart (coronary artery). Arteries may become blocked by cholesterol buildup  (plaques) in the lining of the artery wall. When a coronary artery becomes partially blocked, blood flow to that area decreases. This may lead to chest pain or a heart attack (myocardial infarction). A stent is a small piece of metal that looks like mesh or spring. Stent placement may be done as treatment after a heart attack, or to prevent a heart attack if a blocked artery is found by a coronary angiogram. Let your health care provider know about:  Any allergies you have, including allergies to medicines or contrast dye.  All medicines you are taking, including vitamins, herbs, eye drops, creams, and over-the-counter medicines.  Any problems you or family members have had with anesthetic medicines.  Any blood disorders you have.  Any surgeries you have had.  Any medical conditions you have, including kidney problems or kidney failure.  Whether you are pregnant or may be pregnant.  Whether you are breastfeeding. What are the risks? Generally, this is a safe procedure. However, serious problems may occur, including:  Damage to nearby structures or organs, such as the heart, blood vessels, or kidneys.  A return of blockage.  Bleeding, infection, or bruising at the insertion site.  A collection of blood under the skin (hematoma) at the insertion site.  A blood clot in another part of the body.  Allergic reaction to medicines or dyes.  Bleeding into the abdomen (retroperitoneal bleeding).  Stroke (rare).  Heart attack (rare). What happens before the procedure? Staying hydrated Follow instructions from your health care provider about hydration, which may include:  Up to 2 hours before the procedure - you may continue to drink clear liquids, such as water, clear fruit juice, black coffee, and plain tea.  Eating and drinking restrictions Follow instructions from your health care provider about eating and drinking, which may include:  8 hours before the procedure - stop  eating heavy meals or foods, such as meat, fried foods, or fatty foods.  6 hours before the procedure - stop eating light meals or foods, such as toast or cereal.  2 hours before the procedure - stop drinking clear liquids. Medicines Ask your health care provider about:  Changing or stopping your regular medicines. This is especially important if you are taking diabetes medicines or blood thinners.  Taking medicines such as aspirin and ibuprofen. These medicines can thin your blood. Do not take these medicines unless your health care provider tells you to take them. ? Generally, aspirin is recommended before a thin tube, called a catheter, is passed through a blood vessel and inserted into the heart (cardiac catheterization).  Taking over-the-counter medicines, vitamins, herbs, and supplements. General instructions  Do not use any products that contain nicotine or tobacco for at least 4 weeks before the procedure. These products include cigarettes, e-cigarettes, and chewing tobacco. If you need help quitting, ask your health care provider.  Plan to have someone take you home from the hospital or clinic.  If you will be going home right after the procedure, plan to have someone with you for 24 hours.  You may have tests and imaging procedures.  Ask your health care provider: ? How your insertion site will be marked. Ask which artery will be used for the procedure. ? What steps will be taken to help prevent infection. These may include:  Removing hair at the insertion site.  Washing skin with a germ-killing soap.  Taking antibiotic medicine. What happens during the procedure?   An IV will be inserted into one of your veins.  Electrodes may be placed on your chest to monitor your heart rate during the procedure.  You will be given one or more of the following: ? A medicine to help you relax (sedative). ? A medicine to numb the area (local anesthetic) for catheter  insertion.  A small incision will be made for catheter insertion.  The catheter will be inserted into an artery using a guide wire. The location may be in your groin, your wrist, or the fold of your arm (near your elbow).  An X-ray procedure (fluoroscopy) will be used to help guide the catheter to the opening of the heart arteries.  A dye will be injected into the catheter. X-rays will be taken. The dye helps to show where any narrowing or blockages are located in the arteries.  Tell your health care provider if you have chest pain or trouble breathing.  A tiny wire will be guided to the blocked spot, and a balloon will be inflated to make the artery wider.  The stent will be expanded to crush the plaques into the wall of the vessel. The stent will hold the area open and improve the blood flow. Most stents have a drug coating to reduce the risk of the stent narrowing over time.  The artery may be made wider using a drill, laser, or other tools that remove plaques.  The catheter will be removed when the blood flow improves. The stent will stay where it was placed, and the lining of the artery will grow over it.  A bandage (dressing) will be placed on the insertion site. Pressure will be applied to stop bleeding.  The IV will be removed. This procedure may vary among health care providers and hospitals. What happens after the procedure?  Your blood pressure, heart rate, breathing rate, and blood oxygen level will be monitored until you leave the hospital or clinic.  If the procedure is done through the leg, you will lie flat in bed for a few hours or for as long as told by your health care provider. You will be instructed not to bend or cross your legs.  The insertion site and the pulse in your foot or wrist will be checked often.  You may have more blood tests, X-rays, and a test that records the electrical activity of your heart (electrocardiogram, or ECG).  Do not drive for 24  hours if you were given a sedative during your procedure. Summary  Coronary angiogram with stent placement is a procedure to widen or open a narrowed coronary artery. This is done to treat heart problems.  Before the procedure, let your health care provider know about all the medical conditions and surgeries you have or have had.  This is a safe procedure. However, some problems may occur, including damage to nearby structures or organs, bleeding, blood clots, or allergies.  Follow your health care provider's instructions about eating, drinking, medicines, and other lifestyle changes, such as quitting tobacco use before the procedure. This information is not intended to replace advice given  to you by your health care provider. Make sure you discuss any questions you have with your health care provider. Document Revised: 12/12/2018 Document Reviewed: 12/12/2018 Elsevier Patient Education  2020 [employer].

## 2019-10-21 NOTE — Progress Notes (Signed)
Cardiology Office Note:    Date:  10/21/2019   ID:  Richard Spence, DOB 1956/04/02, MRN [id number]  PCP:  Richard Spence., MD  Cardiologist:  Richard Balsam, MD    Referring MD: Richard Spence., MD   Chief Complaint  Patient presents with  . Follow-up  I have abnormal stress test  History of Present Illness:    Richard Spence is a 64 y.o. male with past medical history significant for paroxysmal atrial fibrillation, status post recent cardioversion, maintaining sinus rhythm with flecainide, anticoagulated, also history of cardiomyopathy years ago with ejection fraction down to 30% however latest echocardiogram showed improvement left ventricle ejection fraction.  He still complains of having some shortness of breath and fatigue as well as chest pain.  Stress test done showed ischemia involving inferior wall he comes today to my office to talk about options for this situation.  We did talk about either medical therapy or cardiac catheterization.  He as well as his wife who with him in the office agreed to have a cardiac catheterization.  Procedure explained to him including all risk benefits as well as alternatives.  He agreed to proceed situation is very more important because he is on flecainide to maintain the sinus rhythm which is contraindicated in people with coronary artery disease.  On top of that he does take carvedilol with resting bradycardia right now and I would like to come down this medication however I am prefer not to do it until I have situation of coronary artery disease clarified.  Past Medical History:  Diagnosis Date  . Altered mental status 10/25/2018  . Atrial fibrillation with rapid ventricular response (HCC) 10/08/2018  . Cardiomyopathy (HCC) 10/25/2018   EF 25-30% on ST. 40% on ECHO 2020.  LVH  . Degeneration of lumbar intervertebral disc 07/30/2015  . DOE (dyspnea on exertion) 10/16/2018  . Fatigue 06/06/2019  . Hearing loss 10/15/2018  . High risk medication  use 07/30/2015  . Long-term use of aspirin therapy 10/15/2018  . Malaise and fatigue 10/25/2018  . Mild cognitive impairment 12/10/2018   Formatting of this note might be different from the original. Okay on eval  . 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 Mixed hyperlipidemia 07/30/2015  . Paroxysmal atrial fibrillation (HCC) 05/06/2019  . Permanent atrial fibrillation (HCC) 10/08/2018  . Persistent atrial fibrillation, status post cardioversion in July 2020 about flip back to atrial fibrillation a few weeks later 01/21/2019  . Postural dizziness with presyncope 10/25/2018  . Prediabetes 07/30/2015  . Right shoulder injury 10/08/2018  . S/P arthroscopy of right shoulder 03/12/2019  . S/P rotator cuff repair 03/12/2019  . Tear of right supraspinatus tendon 10/25/2018  . Tremor 07/30/2015  . Vitamin B 12 deficiency 07/30/2015  . White matter disease, unspecified 11/19/2018   Noted on MRI of brain 2020. Discussed with pt and wife.    Past Surgical History:  Procedure Laterality Date  . BACK SURGERY    . CHOLECYSTECTOMY    . HERNIA REPAIR  2009  . ROTATOR CUFF REPAIR  03/07/2019  . SHOULDER SURGERY Left 12/2010   Rotater cuff  . SPINAL FUSION  1998   Dr Nadene Rubins  . SPINE SURGERY  2001   Spinal hardware removal by Dr. Lilian Kapur  Current Medications: Current Meds  Medication Sig  . apixaban (ELIQUIS) 5 MG TABS tablet Take by mouth 2 (two) times daily.   . carvedilol (COREG) 6.25 MG tablet TAKE 1 TABLET BY MOUTH TWICE (2) DAILY  . Cholecalciferol (VITAMIN D3) 125 MCG (5000 UT) CAPS Take 1 capsule by mouth daily.  . Cyanocobalamin (VITAMIN B12 PO) Take by mouth daily.  [name] diltiazem (CARDIZEM CD) 120 MG 24 hr capsule Take by mouth daily.  . flecainide (TAMBOCOR) 50 MG  tablet TAKE 1 TABLET BY MOUTH TWICE (2) DAILY  . oxyCODONE (OXY IR/ROXICODONE) 5 MG immediate release tablet Take 5 mg by mouth every 4 (four) hours as needed.  [name] PARoxetine (PAXIL) 40 MG tablet Take by mouth every morning.     Allergies:   Patient has no known allergies.   Social History   Socioeconomic History  . Marital status: Married    Spouse name: Not on file  . Number of children: Not on file  . Years of education: Not on file  . Highest education level: Not on file  Occupational History  . Not on file  Tobacco Use  . Smoking status: Never Smoker  . Smokeless tobacco: Never Used  Substance and Sexual Activity  . Alcohol use: Yes    Comment: once in a while  . Drug use: Not Currently  . Sexual activity: Not on file  Other Topics Concern  . Not on file  Social History Narrative  . Not on file   Social Determinants of Health   Financial Resource Strain:   . Difficulty of Paying Living Expenses:   Food Insecurity:   . Worried About Charity fundraiser in the Last Year:   . Arboriculturist in the Last Year:   Transportation Needs:   . Film/video editor (Medical):   [name] Lack of Transportation (Non-Medical):   Physical Activity:   . Days of Exercise per Week:   . Minutes of Exercise per Session:   Stress:   . Feeling of Stress :   Social Connections:   . Frequency of Communication with Friends and Family:   . Frequency of Social Gatherings with Friends and Family:   . Attends Religious Services:   . Active Member of Clubs or Organizations:   . Attends Archivist Meetings:   [name] Marital Status:      Family History: The patient's family history includes Breast cancer in his mother; Dementia in his maternal grandmother and mother; Hypertension in his father and mother; Multiple sclerosis in his son; Prostate cancer in his maternal grandfather. ROS:   Please see the history of present illness.    All 14 point review of systems negative except as  described per history of present illness  EKGs/Labs/Other Studies Reviewed:      Recent Labs: 04/22/2019: BUN 22; Creatinine, Ser 1.15; Hemoglobin 17.4; Magnesium 2.4; Platelets 270; Potassium 4.5; Sodium 142 06/06/2019: TSH 1.030  Recent Lipid Panel No results found for: CHOL, TRIG, HDL, CHOLHDL, VLDL, LDLCALC, LDLDIRECT  Physical Exam:    VS:  BP 122/80   Pulse (!) 49   Ht 6' (1.829 m)   Wt 219 lb (99.3 kg)   SpO2 97%   BMI 29.70 kg/m     Wt Readings from Last 3 Encounters:  10/21/19 219 lb (99.3 kg)  10/01/19 218 lb (98.9 kg)  09/16/19 218 lb 9.6 oz (99.2 kg)     GEN:  Well nourished, well developed in no acute distress  HEENT: Normal NECK: No JVD; No carotid bruits LYMPHATICS: No lymphadenopathy CARDIAC: RRR, no murmurs, no rubs, no gallops RESPIRATORY:  Clear to auscultation without rales, wheezing or rhonchi  ABDOMEN: Soft, non-tender, non-distended MUSCULOSKELETAL:  No edema; No deformity  SKIN: Warm and dry LOWER EXTREMITIES: no swelling NEUROLOGIC:  Alert and oriented x 3 PSYCHIATRIC:  Normal affect   ASSESSMENT:    1. Abnormal stress test   2. Paroxysmal atrial fibrillation (HCC)   3. Mixed hyperlipidemia    PLAN:    In order of problems listed above:  1. Abnormal stress test inferior wall ischemia plan is to pursue cardiac catheterization procedure explained: Risk benefits as well as alternatives we will proceed. 2. Paroxysmal atrial fibrillation maintaining sinus rhythm continue anticoagulation as well as flecainide for now.  If cardiac cath showed significant coronary artery disease flecainide will be discontinued 3. Mixed dyslipidemia continue statin   Medication Adjustments/Labs and Tests Ordered: Current medicines are reviewed at length with the patient today.  Concerns regarding medicines are outlined above.  Orders Placed This Encounter  Procedures  . DG Chest 2 View  . Basic metabolic panel  . CBC  . EKG 12-Lead   Medication changes:  No orders of the defined types were placed in this encounter.   Signed, Georgeanna Lea, MD, [facility] 10/21/2019 10:55 AM    [facility]

## 2019-10-22 ENCOUNTER — Other Ambulatory Visit (HOSPITAL_COMMUNITY)
Admission: RE | Admit: 2019-10-22 | Discharge: 2019-10-22 | Disposition: A | Discharge status: Home / Self Care | Payer: Medicare HMO | Source: Ambulatory Visit | Attending: Cardiology | Admitting: Cardiology

## 2019-10-22 DIAGNOSIS — Z01812 Encounter for preprocedural laboratory examination: Secondary | ICD-10-CM | POA: Insufficient documentation

## 2019-10-22 DIAGNOSIS — Z20822 Contact with and (suspected) exposure to covid-19: Secondary | ICD-10-CM | POA: Insufficient documentation

## 2019-10-22 LAB — BASIC METABOLIC PANEL
BUN/Creatinine Ratio: 17 (ref 10–24)
BUN: 17 mg/dL (ref 8–27)
CO2: 22 mmol/L (ref 20–29)
Calcium: 9.4 mg/dL (ref 8.6–10.2)
Chloride: 102 mmol/L (ref 96–106)
Creatinine, Ser: 1.01 mg/dL (ref 0.76–1.27)
GFR calc Af Amer: 90 mL/min/{1.73_m2} (ref >59)
GFR calc non Af Amer: 78 mL/min/{1.73_m2} (ref >59)
Glucose: 86 mg/dL (ref 65–99)
Potassium: 4.4 mmol/L (ref 3.5–5.2)
Sodium: 139 mmol/L (ref 134–144)

## 2019-10-22 LAB — CBC
Hematocrit: 46.6 % (ref 37.5–51.0)
Hemoglobin: 16 g/dL (ref 13.0–17.7)
MCH: 31.4 pg (ref 26.6–33.0)
MCHC: 34.3 g/dL (ref 31.5–35.7)
MCV: 91 fL (ref 79–97)
Platelets: 261 10*3/uL (ref 150–450)
RBC: 5.1 x10E6/uL (ref 4.14–5.80)
RDW: 13.2 % (ref 11.6–15.4)
WBC: 6.6 10*3/uL (ref 3.4–10.8)

## 2019-10-22 LAB — SARS CORONAVIRUS 2 (TAT 6-24 HRS): SARS Coronavirus 2: NEGATIVE

## 2019-10-24 ENCOUNTER — Telehealth: Payer: Self-pay | Admitting: Cardiovascular Disease

## 2019-10-24 ENCOUNTER — Encounter: Payer: Self-pay | Admitting: Cardiovascular Disease

## 2019-10-24 ENCOUNTER — Other Ambulatory Visit: Payer: Self-pay

## 2019-10-24 ENCOUNTER — Ambulatory Visit: Payer: Medicare HMO | Admitting: Cardiovascular Disease

## 2019-10-24 ENCOUNTER — Telehealth: Payer: Self-pay

## 2019-10-24 VITALS — BP 126/80 | HR 49 | Ht 72.0 in | Wt 218.0 lb

## 2019-10-24 DIAGNOSIS — G4733 Obstructive sleep apnea (adult) (pediatric): Secondary | ICD-10-CM | POA: Diagnosis not present

## 2019-10-24 DIAGNOSIS — G4736 Sleep related hypoventilation in conditions classified elsewhere: Secondary | ICD-10-CM

## 2019-10-24 DIAGNOSIS — E782 Mixed hyperlipidemia: Secondary | ICD-10-CM | POA: Diagnosis not present

## 2019-10-24 DIAGNOSIS — I480 Paroxysmal atrial fibrillation: Secondary | ICD-10-CM

## 2019-10-24 DIAGNOSIS — Z7901 Long term (current) use of anticoagulants: Secondary | ICD-10-CM

## 2019-10-24 DIAGNOSIS — R9439 Abnormal result of other cardiovascular function study: Secondary | ICD-10-CM

## 2019-10-24 DIAGNOSIS — IMO0002 Reserved for concepts with insufficient information to code with codable children: Secondary | ICD-10-CM

## 2019-10-24 NOTE — Telephone Encounter (Signed)
Spoke with patients wife regarding these results and recommendation along with the chest x-ray results.  She verbalizes understanding and is agreeable to plan of care. Advised patient to call back with any issues or concerns.

## 2019-10-24 NOTE — Telephone Encounter (Signed)
-----   Message from Georgeanna Lea, MD sent at 10/24/2019  1:00 PM EDT ----- Labs are good, continue present management

## 2019-10-24 NOTE — Progress Notes (Signed)
Cardiology Office Note    Date:  10/31/2019   ID:  Richard Spence, DOB February 26, 1956, MRN [id number]  PCP:  [name], MD  Cardiologist:  Nicki Guadalajara, MD (sleep); Dr. Bing Matter  New evaluation for sleep apnea  History of Present Illness:  Richard Spence is a 64 y.o. male who is followed by Dr. [name] for primary cardiology care.  He also has recently seen Dr. Thomasene Ripple.  He was recently found to have sleep apnea and presents for his initial sleep evaluation following initiation of CPAP therapy.  Richard Spence has a history of atrial fibrillation and is status post cardioversion and has been maintaining sinus rhythm with flecainide.  He has been on chronic anticoagulation.  There also is a history of cardiomyopathy years ago with an ejection fraction reduced to 30% with subsequent improvement in LV function.  His echo Doppler study in December 2020 showed an EF of 50 to 55% with mild TR and estimated RV systolic pressure was 22 mmHg.  With his cardiac history and due to concerns for sleep apnea he was referred for a diagnostic polysomnogram on June 30, 2019.  He was found to have moderate sleep apnea overall with an AHI of 15.0 and RDI of 25.0.  However, sleep apnea was very severe during REM sleep with an AHI of 66.7/h with oxygen desaturation to a nadir of 85%.  There was moderate snoring.  He subsequently referred for CPAP titration study which was done on August 06, 2019.  He was titrated up to 15 cm water pressure with significant benefit in AHI 0 with O2 nadir at 93%.  There was set up with CPAP therapy and late March with [employer] in [place] as his DME company.  In the office today obtained a download from September 24, 2019 through Oct 23, 2019.  Compliance is excellent at 100%.  Average sleep duration is 6 hours and 35 minutes per night.  At 15 cm water pressure AHI is 0.9/h.  He initially had a significant leak when he was doing fast but over the past 2 weeks  he has been using a new nasal mask significant improvement in his prior leak.  Prior to CPAP therapy, he was noticing more frequent awakenings, sleep is nonrestorative, he was experiencing nocturia anywhere from 3-6 times per night which has been reduced to 1-2 times per night.  He notes a big difference.  He has more energy.  He feels rested.  An Epworth Sleepiness Scale score was calculated in the office today and this endorsed at 5 arguing against any residual daytime sleepiness.  There was only a slight chance of dozing while watching television, and a moderate chance of dozing as a passenger in a car for an hour without a break or lying down to rest in the afternoon when circumstances permit.  He is unaware of any breakthrough atrial fibrillation.  He denies any restless legs, bruxism, hypnagogic hallucinations, or cataplectic events.  He had recently undergone a nuclear stress test on October 01, 2019.  This showed an EF of 51%.  There was a small reversible defect of mild severity in the basal inferior location with normal wall motion.  Due to concern for possible mild ischemia he apparently is set up to undergo diagnostic cardiac catheterization tomorrow in the hospital.  He presents for initial evaluation.   Past Medical History:  Diagnosis Date  . Altered mental status 10/25/2018  . Atrial fibrillation with rapid ventricular  response (HCC) 10/08/2018  . Cardiomyopathy (HCC) 10/25/2018   EF 25-30% on ST. 40% on ECHO 2020.  LVH  . Degeneration of lumbar intervertebral disc 07/30/2015  . DOE (dyspnea on exertion) 10/16/2018  . Fatigue 06/06/2019  . Hearing loss 10/15/2018  . High risk medication use 07/30/2015  . Long-term use of aspirin therapy 10/15/2018  . Malaise and fatigue 10/25/2018  . Mild cognitive impairment 12/10/2018   Formatting of this note might be different from the original. Okay on eval  . 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   Last Assessment & Plan:   Relevant Hx: Course: Daily Update: Today's Plan:will refill his med for him today and he is stable with this  Electronically signed by: [name], FNP 07/30/15 [phone]  . Mixed hyperlipidemia 07/30/2015  . Paroxysmal atrial fibrillation (HCC) 05/06/2019  . Permanent atrial fibrillation (HCC) 10/08/2018  . Persistent atrial fibrillation, status post cardioversion in July 2020 about flip back to atrial fibrillation a few weeks later 01/21/2019  . Postural dizziness with presyncope 10/25/2018  . Prediabetes 07/30/2015  . Right shoulder injury 10/08/2018  . S/P arthroscopy of right shoulder 03/12/2019  . S/P rotator cuff repair 03/12/2019  . Tear of right supraspinatus tendon 10/25/2018  . Tremor 07/30/2015  . Vitamin B 12 deficiency 07/30/2015  . White matter disease, unspecified 11/19/2018   Noted on MRI of brain 2020. Discussed with pt and wife.    Past Surgical History:  Procedure Laterality Date  . BACK SURGERY    . CHOLECYSTECTOMY    . HERNIA REPAIR  2009  . LEFT HEART CATH AND CORONARY ANGIOGRAPHY N/A 10/25/2019   Procedure: LEFT HEART CATH AND CORONARY ANGIOGRAPHY;  Surgeon: [place], Peter M, MD;  Location: [facility] INVASIVE CV LAB;  Service: Cardiovascular;  Laterality: N/A;  . ROTATOR CUFF REPAIR  03/07/2019  . SHOULDER SURGERY Left 12/2010   Rotater cuff  . SPINAL FUSION  1998   Dr Nadene Rubins  . SPINE SURGERY  2001   Spinal hardware removal by Dr. Lilian Kapur    Current Medications: Outpatient Medications Prior to Visit  Medication Sig Dispense Refill  . acetaminophen (TYLENOL) 500 MG tablet Take 1,000 mg by mouth every 6 (six) hours as needed for moderate pain or headache.    . carvedilol (COREG) 6.25 MG tablet TAKE 1 TABLET BY MOUTH TWICE (2) DAILY (Patient taking differently: Take 6.25 mg by mouth 2 (two) times daily. ) 60 tablet 1  . Cholecalciferol (VITAMIN D3) 125 MCG (5000 UT) CAPS Take 5,000 Units by mouth daily.     . diclofenac Sodium (VOLTAREN) 1 % GEL Apply 1 application  topically 4 (four) times daily as needed (pain).    [name] diltiazem (CARDIZEM CD) 120 MG 24 hr capsule Take 120 mg by mouth daily.     . flecainide (TAMBOCOR) 50 MG tablet TAKE 1 TABLET BY MOUTH TWICE (2) DAILY (Patient taking differently: Take 50 mg by mouth 2 (two) times daily. ) 60 tablet 1  . PARoxetine (PAXIL) 40 MG tablet Take 40 mg by mouth daily.     . vitamin B-12 (CYANOCOBALAMIN) 1000 MCG tablet Take 1,000 mcg by mouth daily.    [name] apixaban (ELIQUIS) 5 MG TABS tablet Take 5 mg by mouth 2 (two) times daily.      No facility-administered medications prior to visit.     Allergies:   Patient has no known allergies.   Social History   Socioeconomic History  . Marital status: Married  Spouse name: Not on file  . Number of children: Not on file  . Years of education: Not on file  . Highest education level: Not on file  Occupational History  . Not on file  Tobacco Use  . Smoking status: Never Smoker  . Smokeless tobacco: Never Used  Substance and Sexual Activity  . Alcohol use: Yes    Comment: once in a while  . Drug use: Not Currently  . Sexual activity: Not on file  Other Topics Concern  . Not on file  Social History Narrative  . Not on file   Social Determinants of Health   Financial Resource Strain:   . Difficulty of Paying Living Expenses:   Food Insecurity:   . Worried About Programme researcher, broadcasting/film/video in the Last Year:   . Barista in the Last Year:   Transportation Needs:   . Freight forwarder (Medical):   [name] Lack of Transportation (Non-Medical):   Physical Activity:   . Days of Exercise per Week:   . Minutes of Exercise per Session:   Stress:   . Feeling of Stress :   Social Connections:   . Frequency of Communication with Friends and Family:   . Frequency of Social Gatherings with Friends and Family:   . Attends Religious Services:   . Active Member of Clubs or Organizations:   . Attends Banker Meetings:   [name] Marital Status:      Socially, he was born in [place].  He had previously worked for [employer].  He is married and has 1 child age 36 and 2 grandchildren.  Family History:  The patient's family history includes Breast cancer in his mother; Dementia in his maternal grandmother and mother; Hypertension in his father and mother; Multiple sclerosis in his son; Prostate cancer in his maternal grandfather.   ROS General: Negative; No fevers, chills, or night sweats;  HEENT: Negative; No changes in vision or hearing, sinus congestion, difficulty swallowing Pulmonary: Negative; No cough, wheezing, shortness of breath, hemoptysis Cardiovascular: Positive for PAF, history of prior cardiomyopathy; recent mildly abnormal nuclear stress test GI: Negative; No nausea, vomiting, diarrhea, or abdominal pain GU: Negative; No dysuria, hematuria, or difficulty voiding Musculoskeletal: Negative; no myalgias, joint pain, or weakness Hematologic/Oncology: Negative; no easy bruising, bleeding Endocrine: Negative; no heat/cold intolerance; no diabetes Neuro: Negative; no changes in balance, headaches Skin: Negative; No rashes or skin lesions Psychiatric: Negative; No behavioral problems, depression Sleep: See HPI Other comprehensive 14 point system review is negative.   PHYSICAL EXAM:   VS:  BP 126/80   Pulse (!) 49   Ht 6' (1.829 m)   Wt 218 lb (98.9 kg)   SpO2 99%   BMI 29.57 kg/m     Repeat blood pressure by me 132/80  Wt Readings from Last 3 Encounters:  10/25/19 218 lb (98.9 kg)  10/24/19 218 lb (98.9 kg)  10/21/19 219 lb (99.3 kg)    General: Alert, oriented, no distress.  Skin: normal turgor, no rashes, warm and dry HEENT: Normocephalic, atraumatic. Pupils equal round and reactive to light; sclera anicteric; extraocular muscles intact;  Nose without nasal septal hypertrophy Mouth/Parynx benign; Mallinpatti scale 3 Neck: No JVD, no carotid bruits; normal carotid upstroke Lungs: clear to ausculatation  and percussion; no wheezing or rales Chest wall: without tenderness to palpitation Heart: PMI not displaced, RRR, s1 s2 normal, 1/6 systolic murmur, no diastolic murmur, no rubs, gallops, thrills, or heaves Abdomen: soft, nontender;  no hepatosplenomehaly, BS+; abdominal aorta nontender and not dilated by palpation. Back: no CVA tenderness Pulses 2+ Musculoskeletal: full range of motion, normal strength, no joint deformities Extremities: no clubbing cyanosis or edema, Homan's sign negative  Neurologic: grossly nonfocal; Cranial nerves grossly wnl Psychologic: Normal mood and affect   Studies/Labs Reviewed:   EKG:  EKG is ordered today.  ECG (independently read by me): Sinus bradycardia 50 bpm.  Normal intervals.  No ectopy  Recent Labs: BMP Latest Ref Rng & Units 10/21/2019 04/22/2019 01/31/2019  Glucose 65 - 99 mg/dL 86 82 914(N)  BUN 8 - 27 mg/dL Creatinine 0.76 - 1.27 mg/dL 8.29 5.62 1.30  BUN/Creat Ratio 10 - Sodium 134 - 144 mmol/L 139 142 141  Potassium 3.5 - 5.2 mmol/L 4.4 4.5 4.7  Chloride 96 - 106 mmol/L 102 102 101  CO2 20 - 29 mmol/L Calcium 8.6 - 10.2 mg/dL 9.4 9.8 9.5     No flowsheet data found.  CBC Latest Ref Rng & Units 10/21/2019 04/22/2019  WBC 3.4 - 10.8 x10E3/uL 6.6 6.0  Hemoglobin 13.0 - 17.7 g/dL 86.5 78.4  Hematocrit 69.6 - 51.0 % 46.6 50.8  Platelets 150 - 450 x10E3/uL 261 270   Lab Results  Component Value Date   MCV 91 10/21/2019   MCV 92 04/22/2019   Lab Results  Component Value Date   TSH 1.030 06/06/2019   No results found for: HGBA1C   BNP No results found for: BNP  ProBNP No results found for: PROBNP   Lipid Panel  No results found for: CHOL, TRIG, HDL, CHOLHDL, VLDL, LDLCALC, LDLDIRECT, LABVLDL   RADIOLOGY: DG Chest 2 View  Result Date: 10/21/2019 CLINICAL DATA:  Pre cardiac catheterization. Abnormal stress test. EXAM: CHEST - 2 VIEW COMPARISON:  Radiograph 12/17/2018 FINDINGS: The  cardiomediastinal contours are normal. The lungs are clear. Pulmonary vasculature is normal. No consolidation, pleural effusion, or pneumothorax. No acute osseous abnormalities are seen. Mild degenerative change in the thoracic spine. IMPRESSION: Unremarkable radiographs of the chest. Electronically Signed   By: Narda Rutherford M.D.   On: 10/21/2019 14:24   CARDIAC CATHETERIZATION  Result Date: 10/25/2019  Prox LAD lesion is 45% stenosed.  The left ventricular systolic function is normal.  LV end diastolic pressure is normal.  The left ventricular ejection fraction is 55-65% by visual estimate.  1. Nonobstructive CAD 2. Normal LV function 3. Normal LVEDP Plan: medical therapy. May resume Eliquis in am.     Additional studies/ records that were reviewed today include:   CPAP Titration Study: 08/06/2019 CLINICAL INFORMATION The patient is referred for a CPAP titration to treat sleep apnea.  Date of NPSG: 06/30/2019:  AHI 15.0; RDI 25.0/h; REM AHI 66.7/h; supine AHI 22.5/h; O2 nadir 85%; moderate snoring.  SLEEP STUDY TECHNIQUE As per the AASM Manual for the Scoring of Sleep and Associated Events v2.3 (April 2016) with a hypopnea requiring 4% desaturations.  The channels recorded and monitored were frontal, central and occipital EEG, electrooculogram (EOG), submentalis EMG (chin), nasal and oral airflow, thoracic and abdominal wall motion, anterior tibialis EMG, snore microphone, electrocardiogram, and pulse oximetry. Continuous positive airway pressure (CPAP) was initiated at the beginning of the study and titrated to treat sleep-disordered breathing.  MEDICATIONS apixaban (ELIQUIS) 5 MG TABS tablet Ascorbic Acid (VITAMIN C) 1000 MG tablet carvedilol (COREG) 6.25 MG tablet Cholecalciferol (VITAMIN D3) 125 MCG (5000 UT) CAPS Cyanocobalamin (VITAMIN B12 PO) diltiazem (CARDIZEM CD)  120 MG 24 hr capsule flecainide (TAMBOCOR) 50 MG tablet PARoxetine (PAXIL) 40 MG tablet Zinc 50 MG  TABS  Medications self-administered by patient taken the night of the study : N/A  TECHNICIAN COMMENTS Comments added by technician: PATIENT WAS ORDERED AS A CPAP TITRATION. Comments added by scorer: N/A  RESPIRATORY PARAMETERS Optimal PAP Pressure (cm):  15        AHI at Optimal Pressure (/hr):            0.0 Overall Minimal O2 (%):         89.0     Supine % at Optimal Pressure (%):    0 Minimal O2 at Optimal Pressure (%): 93.0       SLEEP ARCHITECTURE The study was initiated at 11:07:30 PM and ended at 5:05:40 AM.  Sleep onset time was 53.6 minutes and the sleep efficiency was 78.0%%. The total sleep time was 279.5 minutes.  The patient spent 8.6%% of the night in stage N1 sleep, 91.4%% in stage N2 sleep, 0.0%% in stage N3 and 0% in REM.Stage REM latency was N/A minutes  Wake after sleep onset was 25.1. Alpha intrusion was absent. Supine sleep was 19.50%.  CARDIAC DATA The 2 lead EKG demonstrated sinus rhythm. The mean heart rate was 46.4 beats per minute. Other EKG findings include: None.  LEG MOVEMENT DATA The total Periodic Limb Movements of Sleep (PLMS) were 0. The PLMS index was 0.0. A PLMS index of <15 is considered normal in adults.  IMPRESSIONS - CPAP was started at 5 cm and was titrated to optimal PAP pressure at15 cm of water; AHI 0/h; RDI 1.2/h; O2 nadir 93%. - Central sleep apnea was not noted during this titration (CAI = 0.0/h). - Mild oxygen desaturations to a nadir of 89.0% at 7 cm. - The patient snored with soft snoring volume during this titration study. - No cardiac abnormalities were observed during this study. - Clinically significant periodic limb movements were not noted during this study. Arousals associated with PLMs were significant.  DIAGNOSIS - Obstructive Sleep Apnea (327.23 [G47.33 ICD-10]) - Periodic Limb Movement During Sleep (327.51 [G47.61 ICD-10])  RECOMMENDATIONS - Recommend an initial trial of CPAP therapy with EPR at 15 cm  H2O with heated humidification.  A Medium size Fisher&Paykel Full Face Mask Simplus mask was used for thte titration. - Effort should be made to optimize nasal and pharygeal patency.  - Avoid alcohol, sedatives and other CNS depressants that may worsen sleep apnea and disrupt normal sleep architecture. - Sleep hygiene should be reviewed to assess factors that may improve sleep quality. - Weight management and regular exercise should be initiated or continued. - Recommend a download in 30 days and sleep clinic evaluation after 4 weeks of therapy.   [Electronically signed] 08/12/2019 04:42 PM   ASSESSMENT:    1. OSA (obstructive sleep apnea)   2. Sleep-related hypoventilation   3. Paroxysmal atrial fibrillation (HCC)   4. Mixed hyperlipidemia   5. Abnormal stress test   6. Anticoagulation adequate      PLAN:  Richard Spence is a very pleasant 26 gentleman who is originally from [place] who has a history of hypertension, hyperlipidemia, paroxysmal atrial fibrillation for which he has undergone cardioversion and has been maintaining sinus rhythm on flecainide therapy.  He is on anticoagulation.  With his cardiac history as well as symptoms worrisome for sleep apnea including snoring, frequent nocturia, nonrestorative sleep, he ultimately was referred for a sleep study which confirmed moderate  overall sleep apnea; however this was very severe during REM sleep with associated oxygen desaturation to 85%.  I had an extensive discussion with him today in the office and reviewed both his diagnostic polysomnogram as well as a CPAP titration study.  I reviewed normal sleep architecture and the effects of untreated sleep apnea with reference to sleep architecture.  I specifically discussed the effects of untreated sleep apnea contributing to blood pressure elevation, nocturnal arrhythmias, potential ischemia mediated coronary as well as cerebrovascular events in addition to its effect on  glucose and GERD.  Since initiating CPAP nightly he has noticed marked benefit.  His nocturia has improved from 3-6 times per night down to 1-2.  His sleep is restorative.  He denies any residual daytime sleepiness or awareness of any breakthrough snoring.  He had recently undergone a nuclear perfusion study which raise concern for possible ischemia and for this reason definitive cardiac catheterization is scheduled to be done tomorrow.  Discussed importance of maintaining high oxygen saturation during the evening to reduce potential ischemia.  His DME company is [employer] [facility] [employer].  I obtained and reviewed a download with him in detail which shows excellent compliance with 100% of CPAP use with average sleep duration at 6 hours and 35 minutes.  AHI was excellent at 0.9 at a 15 cm water pressure.  He did have moderate leak with a full facemask but this has significant improved with a nasal mask.  I discussed optimal sleep duration at 7 to preferably 8 hours per night.  His blood pressure today is stable on his regimen consisting of carvedilol 6.25 mg twice a day and diltiazem 120 mg daily.  He continues to be on flecainide 50 mg twice a day.  I reviewed his prior ECG.  He is on Paxil for anxiety/depression.  He continues on Eliquis without bleeding at this has been on hold today for his anticipated cardiac catheterization tomorrow.  I answered all his questions.  I will see him in 1 year for follow-up evaluation or sooner as needed.   Medication Adjustments/Labs and Tests Ordered: Current medicines are reviewed at length with the patient today.  Concerns regarding medicines are outlined above.  Medication changes, Labs and Tests ordered today are listed in the Patient Instructions below. Patient Instructions    Follow-Up: At [facility], you and your health needs are our priority.  As part of our continuing mission to provide you with exceptional heart care, we have created designated Provider Care  Teams.  These Care Teams include your primary Cardiologist (physician) and Advanced Practice Providers (APPs -  Physician Assistants and Nurse Practitioners) who all work together to provide you with the care you need, when you need it.  We recommend signing up for the patient portal called "MyChart".  Sign up information is provided on this After Visit Summary.  MyChart is used to connect with patients for Virtual Visits (Telemedicine).  Patients are able to view lab/test results, encounter notes, upcoming appointments, etc.  Non-urgent messages can be sent to your provider as well.   To learn more about what you can do with MyChart, go to [email or web address].    Your next appointment:   12 month(s)  The format for your next appointment:   In Person  Provider:   Nicki Guadalajara, MD       Signed, Nicki Guadalajara, MD  10/31/2019 11:13 AM    [facility] [address], [place], [place]  [address] Phone: [phone]    

## 2019-10-24 NOTE — Telephone Encounter (Signed)
Pt contacted pre-catheterization scheduled at [facility] for: Friday Oct 25, 2019 12 Noon Verified arrival time and place: [facility] Main Entrance [facility]) at: 10 AM   No solid food after midnight prior to cath, clear liquids until 5 AM day of procedure.  Hold: Eliquis-None 10/23/19 until post procedure  Except hold medications AM meds can be  taken pre-cath with sip of water including: ASA 81 mg   Confirmed patient has responsible adult to drive home post procedure and observe 24 hours after arriving home:  yes  You are allowed ONE visitor in the waiting room during your procedure. Both you and your visitor must wear masks.      COVID-19 Pre-Screening Questions:  . In the past 7 to 10 days have you had a cough,  shortness of breath, headache, congestion, fever (100 or greater) body aches, chills, sore throat, or sudden loss of taste or sense of smell? no . Have you been around anyone with known Covid 19 in the past 7 to 10 days? no . Have you been around anyone who is awaiting Covid 19 test results in the past 7 to 10 days? no . Have you been around anyone who has mentioned symptoms of Covid 19 within the past 7 to 10 days? no    Reviewed procedure/mask/visitor instructions, COVID-19 screening questions with patient's wife(DPR).

## 2019-10-24 NOTE — Telephone Encounter (Signed)
New Message ° ° ° °Pts wife is returning a call  ° °Please call back  °

## 2019-10-24 NOTE — Patient Instructions (Signed)
  Follow-Up: At [facility], you and your health needs are our priority.  As part of our continuing mission to provide you with exceptional heart care, we have created designated Provider Care Teams.  These Care Teams include your primary Cardiologist (physician) and Advanced Practice Providers (APPs -  Physician Assistants and Nurse Practitioners) who all work together to provide you with the care you need, when you need it.  We recommend signing up for the patient portal called "MyChart".  Sign up information is provided on this After Visit Summary.  MyChart is used to connect with patients for Virtual Visits (Telemedicine).  Patients are able to view lab/test results, encounter notes, upcoming appointments, etc.  Non-urgent messages can be sent to your provider as well.   To learn more about what you can do with MyChart, go to [email or web address].    Your next appointment:   12 month(s)  The format for your next appointment:   In Person  Provider:   Thomas Kelly, MD    

## 2019-10-25 ENCOUNTER — Other Ambulatory Visit: Payer: Self-pay

## 2019-10-25 ENCOUNTER — Encounter (HOSPITAL_COMMUNITY)
Admission: RE | Disposition: A | Discharge status: Home / Self Care | Payer: Self-pay | Source: Home / Self Care | Attending: Cardiology

## 2019-10-25 ENCOUNTER — Ambulatory Visit (HOSPITAL_COMMUNITY)
Admission: RE | Admit: 2019-10-25 | Discharge: 2019-10-25 | Disposition: A | Discharge status: Home / Self Care | Payer: Medicare HMO | Attending: Cardiology | Admitting: Cardiology

## 2019-10-25 DIAGNOSIS — E538 Deficiency of other specified B group vitamins: Secondary | ICD-10-CM | POA: Diagnosis not present

## 2019-10-25 DIAGNOSIS — R0609 Other forms of dyspnea: Secondary | ICD-10-CM | POA: Diagnosis present

## 2019-10-25 DIAGNOSIS — R9439 Abnormal result of other cardiovascular function study: Secondary | ICD-10-CM | POA: Diagnosis present

## 2019-10-25 DIAGNOSIS — R0602 Shortness of breath: Secondary | ICD-10-CM | POA: Diagnosis not present

## 2019-10-25 DIAGNOSIS — R079 Chest pain, unspecified: Secondary | ICD-10-CM | POA: Diagnosis not present

## 2019-10-25 DIAGNOSIS — E782 Mixed hyperlipidemia: Secondary | ICD-10-CM | POA: Insufficient documentation

## 2019-10-25 DIAGNOSIS — I429 Cardiomyopathy, unspecified: Secondary | ICD-10-CM

## 2019-10-25 DIAGNOSIS — Z7901 Long term (current) use of anticoagulants: Secondary | ICD-10-CM | POA: Insufficient documentation

## 2019-10-25 DIAGNOSIS — Z79899 Other long term (current) drug therapy: Secondary | ICD-10-CM | POA: Diagnosis not present

## 2019-10-25 DIAGNOSIS — R7303 Prediabetes: Secondary | ICD-10-CM | POA: Diagnosis not present

## 2019-10-25 DIAGNOSIS — I2510 Atherosclerotic heart disease of native coronary artery without angina pectoris: Secondary | ICD-10-CM

## 2019-10-25 DIAGNOSIS — I480 Paroxysmal atrial fibrillation: Secondary | ICD-10-CM | POA: Diagnosis present

## 2019-10-25 DIAGNOSIS — I4821 Permanent atrial fibrillation: Secondary | ICD-10-CM | POA: Diagnosis not present

## 2019-10-25 HISTORY — PX: LEFT HEART CATH AND CORONARY ANGIOGRAPHY: CATH118249

## 2019-10-25 SURGERY — LEFT HEART CATH AND CORONARY ANGIOGRAPHY
Anesthesia: LOCAL

## 2019-10-25 MED ORDER — VERAPAMIL HCL 2.5 MG/ML IV SOLN
INTRAVENOUS | Status: DC | PRN
  Administered 2019-10-25: 10 mL via INTRA_ARTERIAL

## 2019-10-25 MED ORDER — HEPARIN SODIUM (PORCINE) 1000 UNIT/ML IJ SOLN
INTRAMUSCULAR | Status: DC | PRN
  Administered 2019-10-25: 5000 [IU] via INTRAVENOUS

## 2019-10-25 MED ORDER — ASPIRIN 81 MG PO CHEW
CHEWABLE_TABLET | ORAL | Status: AC
  Filled 2019-10-25: qty 1

## 2019-10-25 MED ORDER — SODIUM CHLORIDE 0.9% FLUSH: 3.0000 mL | INTRAVENOUS | Status: DC | PRN

## 2019-10-25 MED ORDER — MIDAZOLAM HCL 2 MG/2ML IJ SOLN
INTRAMUSCULAR | Status: DC | PRN
  Administered 2019-10-25: 1 mg via INTRAVENOUS

## 2019-10-25 MED ORDER — HEPARIN SODIUM (PORCINE) 1000 UNIT/ML IJ SOLN
INTRAMUSCULAR | Status: AC
  Filled 2019-10-25: qty 1

## 2019-10-25 MED ORDER — HEPARIN (PORCINE) IN NACL 1000-0.9 UT/500ML-% IV SOLN
INTRAVENOUS | Status: DC | PRN
  Administered 2019-10-25 (×2): 500 mL

## 2019-10-25 MED ORDER — ACETAMINOPHEN 325 MG PO TABS: 650.0000 mg | ORAL_TABLET | ORAL | Status: DC | PRN

## 2019-10-25 MED ORDER — SODIUM CHLORIDE 0.9% FLUSH: 3.0000 mL | Freq: Two times a day (BID) | INTRAVENOUS | Status: DC

## 2019-10-25 MED ORDER — APIXABAN 5 MG PO TABS: 5.0000 mg | ORAL_TABLET | Freq: Two times a day (BID) | ORAL | Status: AC

## 2019-10-25 MED ORDER — IOHEXOL 350 MG/ML SOLN
INTRAVENOUS | Status: DC | PRN
  Administered 2019-10-25: 75 mL

## 2019-10-25 MED ORDER — MIDAZOLAM HCL 2 MG/2ML IJ SOLN
INTRAMUSCULAR | Status: AC
  Filled 2019-10-25: qty 2

## 2019-10-25 MED ORDER — SODIUM CHLORIDE 0.9 % IV SOLN: 250.0000 mL | INTRAVENOUS | Status: DC | PRN

## 2019-10-25 MED ORDER — FENTANYL CITRATE (PF) 100 MCG/2ML IJ SOLN
INTRAMUSCULAR | Status: DC | PRN
  Administered 2019-10-25: 25 ug via INTRAVENOUS

## 2019-10-25 MED ORDER — LIDOCAINE HCL (PF) 1 % IJ SOLN
INTRAMUSCULAR | Status: AC
  Filled 2019-10-25: qty 30

## 2019-10-25 MED ORDER — ONDANSETRON HCL 4 MG/2ML IJ SOLN: 4.0000 mg | Freq: Four times a day (QID) | INTRAMUSCULAR | Status: DC | PRN

## 2019-10-25 MED ORDER — FENTANYL CITRATE (PF) 100 MCG/2ML IJ SOLN
INTRAMUSCULAR | Status: AC
  Filled 2019-10-25: qty 2

## 2019-10-25 MED ORDER — ASPIRIN 81 MG PO CHEW
81.0000 mg | CHEWABLE_TABLET | ORAL | Status: AC
  Administered 2019-10-25: 81 mg via ORAL

## 2019-10-25 MED ORDER — VERAPAMIL HCL 2.5 MG/ML IV SOLN
INTRAVENOUS | Status: AC
  Filled 2019-10-25: qty 2

## 2019-10-25 MED ORDER — HEPARIN (PORCINE) IN NACL 1000-0.9 UT/500ML-% IV SOLN
INTRAVENOUS | Status: AC
  Filled 2019-10-25: qty 1000

## 2019-10-25 MED ORDER — LIDOCAINE HCL (PF) 1 % IJ SOLN
INTRAMUSCULAR | Status: DC | PRN
  Administered 2019-10-25: 2 mL

## 2019-10-25 MED ORDER — SODIUM CHLORIDE 0.9 % WEIGHT BASED INFUSION
1.0000 mL/kg/h | INTRAVENOUS | Status: DC
  Administered 2019-10-25: 1 mL/kg/h via INTRAVENOUS

## 2019-10-25 MED ORDER — SODIUM CHLORIDE 0.9 % WEIGHT BASED INFUSION
3.0000 mL/kg/h | INTRAVENOUS | Status: AC
  Administered 2019-10-25: 3 mL/kg/h via INTRAVENOUS

## 2019-10-25 MED ORDER — SODIUM CHLORIDE 0.9 % WEIGHT BASED INFUSION: 1.0000 mL/kg/h | INTRAVENOUS | Status: AC

## 2019-10-25 SURGICAL SUPPLY — 10 items
CATH 5FR JL3.5 JR4 ANG PIG MP (CATHETERS) ×2 IMPLANT
DEVICE RAD COMP TR BAND LRG (VASCULAR PRODUCTS) ×2 IMPLANT
GLIDESHEATH SLEND SS 6F .021 (SHEATH) ×2 IMPLANT
GUIDEWIRE INQWIRE 1.5J.035X260 (WIRE) ×1 IMPLANT
INQWIRE 1.5J .035X260CM (WIRE) ×2
KIT HEART LEFT (KITS) ×2 IMPLANT
PACK CARDIAC CATHETERIZATION (CUSTOM PROCEDURE TRAY) ×2 IMPLANT
SYR MEDRAD MARK 7 150ML (SYRINGE) ×2 IMPLANT
TRANSDUCER W/STOPCOCK (MISCELLANEOUS) ×2 IMPLANT
TUBING CIL FLEX 10 FLL-RA (TUBING) ×2 IMPLANT

## 2019-10-25 NOTE — Interval H&P Note (Signed)
History and Physical Interval Note:  10/25/2019 1:20 PM  [name]  has presented today for surgery, with the diagnosis of abnormal stress test.  The various methods of treatment have been discussed with the patient and family. After consideration of risks, benefits and other options for treatment, the patient has consented to  Procedure(s): LEFT HEART CATH AND CORONARY ANGIOGRAPHY (N/A) as a surgical intervention.  The patient's history has been reviewed, patient examined, no change in status, stable for surgery.  I have reviewed the patient's chart and labs.  Questions were answered to the patient's satisfaction.   Cath Lab Visit (complete for each Cath Lab visit)  Clinical Evaluation Leading to the Procedure:   ACS: No.  Non-ACS:    Anginal Classification: CCS II  Anti-ischemic medical therapy: Maximal Therapy (2 or more classes of medications)  Non-Invasive Test Results: Intermediate-risk stress test findings: cardiac mortality 1-3%/year  Prior CABG: No previous CABG        Theron Arista [facility] 10/25/2019 1:20 PM

## 2019-10-25 NOTE — Discharge Instructions (Signed)
Moderate Conscious Sedation, Adult, Care After These instructions provide you with information about caring for yourself after your procedure. Your health care provider may also give you more specific instructions. Your treatment has been planned according to current medical practices, but problems sometimes occur. Call your health care provider if you have any problems or questions after your procedure. What can I expect after the procedure? After your procedure, it is common: To feel sleepy for several hours. To feel clumsy and have poor balance for several hours. To have poor judgment for several hours. To vomit if you eat too soon. Follow these instructions at home: For at least 24 hours after the procedure:  Do not: Participate in activities where you could fall or become injured. Drive. Use heavy machinery. Drink alcohol. Take sleeping pills or medicines that cause drowsiness. Make important decisions or sign legal documents. Take care of children on your own. Rest. Eating and drinking Follow the diet recommended by your health care provider. If you vomit: Drink water, juice, or soup when you can drink without vomiting. Make sure you have little or no nausea before eating solid foods. General instructions Have a responsible adult stay with you until you are awake and alert. Take over-the-counter and prescription medicines only as told by your health care provider. If you smoke, do not smoke without supervision. Keep all follow-up visits as told by your health care provider. This is important. Contact a health care provider if: You keep feeling nauseous or you keep vomiting. You feel light-headed. You develop a rash. You have a fever. Get help right away if: You have trouble breathing. This information is not intended to replace advice given to you by your health care provider. Make sure you discuss any questions you have with your health care provider. Document Revised:  05/05/2017 Document Reviewed: 09/12/2015 Elsevier Patient Education  2020 Elsevier Inc. Radial Site Care  This sheet gives you information about how to care for yourself after your procedure. Your health care provider may also give you more specific instructions. If you have problems or questions, contact your health care provider. What can I expect after the procedure? After the procedure, it is common to have:  Bruising and tenderness at the catheter insertion area. Follow these instructions at home: Medicines  Take over-the-counter and prescription medicines only as told by your health care provider. Insertion site care  Follow instructions from your health care provider about how to take care of your insertion site. Make sure you: ? Wash your hands with soap and water before you change your bandage (dressing). If soap and water are not available, use hand sanitizer. ? Change your dressing as told by your health care provider. ?  skin glue, or adhesive strips in place. These skin closures may need to stay in place for 2 weeks or longer. If adhesive strip edges start to loosen and curl up, you may trim the loose edges. Do not remove adhesive strips completely unless your health care provider tells you to do that.  Check your insertion site every day for signs of infection. Check for: ? Redness, swelling, or pain. ? Fluid or blood. ? Pus or a bad smell. ? Warmth.  Do not take baths, swim, or use a hot tub until your health care provider approves.  You may shower 24-48 hours after the procedure, or as directed by your health care provider. ? Remove the dressing and gently wash the site with plain soap and water. ? Dennie Bible the  area dry with a clean towel. ? Do not rub the site. That could cause bleeding.  Do not apply powder or lotion to the site. Activity   For 24 hours after the procedure, or as directed by your health care provider: ? Do not flex or bend the affected arm. ? Do  not push or pull heavy objects with the affected arm. ? Do not drive yourself home from the hospital or clinic. You may drive 24 hours after the procedure unless your health care provider tells you not to. ? Do not operate machinery or power tools.  Do not lift anything that is heavier than 10 lb (4.5 kg), or the limit that you are told, until your health care provider says that it is safe.  Ask your health care provider when it is okay to: ? Return to work or school. ? Resume usual physical activities or sports. ? Resume sexual activity. General instructions  If the catheter site starts to bleed, raise your arm and put firm pressure on the site. If the bleeding does not stop, get help right away. This is a medical emergency.  If you went home on the same day as your procedure, a responsible adult should be with you for the first 24 hours after you arrive home.  Keep all follow-up visits as told by your health care provider. This is important. Contact a health care provider if:  You have a fever.  You have redness, swelling, or yellow drainage around your insertion site. Get help right away if:  You have unusual pain at the radial site.  The catheter insertion area swells very fast.  The insertion area is bleeding, and the bleeding does not stop when you hold steady pressure on the area.  Your arm or hand becomes pale, cool, tingly, or numb. These symptoms may represent a serious problem that is an emergency. Do not wait to see if the symptoms will go away. Get medical help right away. Call your local emergency services (911 in the [place] Do not drive yourself to the hospital. Summary  After the procedure, it is common to have bruising and tenderness at the site.  Follow instructions from your health care provider about how to take care of your radial site wound. Check the wound every day for signs of infection.  Do not lift anything that is heavier than 10 lb (4.5 kg), or the  limit that you are told, until your health care provider says that it is safe. This information is not intended to replace advice given to you by your health care provider. Make sure you discuss any questions you have with your health care provider. Document Revised: 06/28/2017 Document Reviewed: 06/28/2017 Elsevier Patient Education  2020 [employer].

## 2019-10-31 ENCOUNTER — Encounter: Payer: Self-pay | Admitting: Cardiovascular Disease

## 2019-10-31 ENCOUNTER — Ambulatory Visit: Payer: Medicare HMO | Admitting: Medical

## 2019-11-16 ENCOUNTER — Other Ambulatory Visit: Payer: Self-pay | Admitting: Cardiology

## 2019-12-02 ENCOUNTER — Encounter: Payer: Self-pay | Admitting: Cardiology

## 2019-12-02 ENCOUNTER — Ambulatory Visit: Payer: Medicare HMO | Admitting: Cardiology

## 2019-12-02 ENCOUNTER — Other Ambulatory Visit: Payer: Self-pay

## 2019-12-02 VITALS — BP 132/68 | HR 50 | Ht 72.0 in | Wt 222.6 lb

## 2019-12-02 DIAGNOSIS — E782 Mixed hyperlipidemia: Secondary | ICD-10-CM | POA: Diagnosis not present

## 2019-12-02 DIAGNOSIS — I4821 Permanent atrial fibrillation: Secondary | ICD-10-CM | POA: Diagnosis not present

## 2019-12-02 DIAGNOSIS — R9439 Abnormal result of other cardiovascular function study: Secondary | ICD-10-CM | POA: Diagnosis not present

## 2019-12-02 DIAGNOSIS — I25118 Atherosclerotic heart disease of native coronary artery with other forms of angina pectoris: Secondary | ICD-10-CM | POA: Insufficient documentation

## 2019-12-02 DIAGNOSIS — I2510 Atherosclerotic heart disease of native coronary artery without angina pectoris: Secondary | ICD-10-CM

## 2019-12-02 HISTORY — DX: Atherosclerotic heart disease of native coronary artery with other forms of angina pectoris: I25.118

## 2019-12-02 HISTORY — DX: Atherosclerotic heart disease of native coronary artery without angina pectoris: I25.10

## 2019-12-02 MED ORDER — ROSUVASTATIN CALCIUM 10 MG PO TABS
10.0000 mg | ORAL_TABLET | Freq: Every day | ORAL | 1 refills | Status: DC
Start: 2019-12-02 — End: 2020-01-24

## 2019-12-02 NOTE — Patient Instructions (Signed)
Medication Instructions:  Your physician has recommended you make the following change in your medication:  START: Crestor 10 mg daily   *If you need a refill on your cardiac medications before your next appointment, please call your pharmacy*   Lab Work: None.  If you have labs (blood work) drawn today and your tests are completely normal, you will receive your results only by: [name] MyChart Message (if you have MyChart) OR . A paper copy in the mail If you have any lab test that is abnormal or we need to change your treatment, we will call you to review the results.   Testing/Procedures: None    Follow-Up: At [facility], you and your health needs are our priority.  As part of our continuing mission to provide you with exceptional heart care, we have created designated Provider Care Teams.  These Care Teams include your primary Cardiologist (physician) and Advanced Practice Providers (APPs -  Physician Assistants and Nurse Practitioners) who all work together to provide you with the care you need, when you need it.  We recommend signing up for the patient portal called "MyChart".  Sign up information is provided on this After Visit Summary.  MyChart is used to connect with patients for Virtual Visits (Telemedicine).  Patients are able to view lab/test results, encounter notes, upcoming appointments, etc.  Non-urgent messages can be sent to your provider as well.   To learn more about what you can do with MyChart, go to [email or web address].    Your next appointment:   4 month(s)  The format for your next appointment:   In Person  Provider:   [name], MD   Other Instructions  Rosuvastatin Tablets What is this medicine? ROSUVASTATIN (roe SOO va sta tin) is known as a HMG-CoA reductase inhibitor or 'statin'. It lowers cholesterol and triglycerides in the blood. This drug may also reduce the risk of heart attack, stroke, or other health problems in patients with risk  factors for heart disease. Diet and lifestyle changes are often used with this drug. This medicine may be used for other purposes; ask your health care provider or pharmacist if you have questions. COMMON BRAND NAME(S): Crestor What should I tell my health care provider before I take this medicine? They need to know if you have any of these conditions:  diabetes  if you often drink alcohol  history of stroke  kidney disease  liver disease  muscle aches or weakness  thyroid disease  an unusual or allergic reaction to rosuvastatin, other medicines, foods, dyes, or preservatives  pregnant or trying to get pregnant  breast-feeding How should I use this medicine? Take this medicine by mouth with a glass of water. Follow the directions on the prescription label. Do not cut, crush or chew this medicine. You can take this medicine with or without food. Take your doses at regular intervals. Do not take your medicine more often than directed. Talk to your pediatrician regarding the use of this medicine in children. While this drug may be prescribed for children as young as 23 years old for selected conditions, precautions do apply. Overdosage: If you think you have taken too much of this medicine contact a poison control center or emergency room at once. NOTE: This medicine is only for you. Do not share this medicine with others. What if I miss a dose? If you miss a dose, take it as soon as you can. If your next dose is to be taken in less than  12 hours, then do not take the missed dose. Take the next dose at your regular time. Do not take double or extra doses. What may interact with this medicine? Do not take this medicine with any of the following medications:  herbal medicines like red yeast rice This medicine may also interact with the following medications:  alcohol  antacids containing aluminum hydroxide or magnesium hydroxide  cyclosporine  other medicines for high  cholesterol  some medicines for HIV infection  warfarin This list may not describe all possible interactions. Give your health care provider a list of all the medicines, herbs, non-prescription drugs, or dietary supplements you use. Also tell them if you smoke, drink alcohol, or use illegal drugs. Some items may interact with your medicine. What should I watch for while using this medicine? Visit your doctor or health care professional for regular check-ups. You may need regular tests to make sure your liver is working properly. Your health care professional may tell you to stop taking this medicine if you develop muscle problems. If your muscle problems do not go away after stopping this medicine, contact your health care professional. Do not become pregnant while taking this medicine. Women should inform their health care professional if they wish to become pregnant or think they might be pregnant. There is a potential for serious side effects to an unborn child. Talk to your health care professional or pharmacist for more information. Do not breast-feed an infant while taking this medicine. This medicine may increase blood sugar. Ask your healthcare provider if changes in diet or medicines are needed if you have diabetes. If you are going to need surgery or other procedure, tell your doctor that you are using this medicine. This drug is only part of a total heart-health program. Your doctor or a dietician can suggest a low-cholesterol and low-fat diet to help. Avoid alcohol and smoking, and keep a proper exercise schedule. This medicine may cause a decrease in Co-Enzyme Q-10. You should make sure that you get enough Co-Enzyme Q-10 while you are taking this medicine. Discuss the foods you eat and the vitamins you take with your health care professional. What side effects may I notice from receiving this medicine? Side effects that you should report to your doctor or health care professional as soon  as possible:  allergic reactions like skin rash, itching or hives, swelling of the face, lips, or tongue  confusion  joint pain  loss of memory  redness, blistering, peeling or loosening of the skin, including inside the mouth  signs and symptoms of high blood sugar such as being more thirsty or hungry or having to urinate more than normal. You may also feel very tired or have blurry vision.  signs and symptoms of muscle injury like dark urine; trouble passing urine or change in the amount of urine; unusually weak or tired; muscle pain or side or back pain  yellowing of the eyes or skin Side effects that usually do not require medical attention (report to your doctor or health care professional if they continue or are bothersome):  constipation  diarrhea  dizziness  gas  headache  nausea  stomach pain  trouble sleeping  upset stomach This list may not describe all possible side effects. Call your doctor for medical advice about side effects. You may report side effects to FDA at 1-800-FDA-1088. Where should I keep my medicine? Keep out of the reach of children. Store at room temperature between 20 and 25 degrees C (  68 and 77 degrees F). Keep container tightly closed (protect from moisture). Throw away any unused medicine after the expiration date. NOTE: This sheet is a summary. It may not cover all possible information. If you have questions about this medicine, talk to your doctor, pharmacist, or health care provider.  2020 Elsevier/Gold Standard (2018-03-15 08:25:08)

## 2019-12-02 NOTE — Progress Notes (Signed)
Cardiology Office Note:    Date:  12/02/2019   ID:  Richard Spence, DOB Nov 17, 1955, MRN [id number]  PCP:  [name], MD  Cardiologist:  [name], MD    Referring MD: [name], MD   No chief complaint on file. Doing well  History of Present Illness:    Richard Spence is a 64 y.o. male with past medical history significant for paroxysmal atrial fibrillation, recent stress test being abnormal he did have a cardiac catheterization done which showed only 45% stenosis of proximal LAD.  He also have history of cardiomyopathy with ejection fraction before 25 to 30% couple years ago but latest echocardiogram done in December 2020 showed ejection fraction 50 to 55%.  Richard Spence comes today to my office for follow-up.  Complain of having some neck pain.  He did have some CT of his neck done and the some significant changes he is scheduled to see neurosurgeon.  We did discuss results of his cardiac catheterization showed only 45% stenosis of proximal LAD.  Otherwise his coronary arteries were clean.  Describes still have some chest pain but pain happened in different situation not provoked by exercise.  It is possible that the pain is related to his neck issue.  I told him that based on cardiac catheterization we did not see explanation for his symptoms unless if we dealing with small vessel disease.  Past Medical History:  Diagnosis Date  . Altered mental status 10/25/2018  . Atrial fibrillation with rapid ventricular response (HCC) 10/08/2018  . Cardiomyopathy (HCC) 10/25/2018   EF 25-30% on ST. 40% on ECHO 2020.  LVH  . Degeneration of lumbar intervertebral disc 07/30/2015  . DOE (dyspnea on exertion) 10/16/2018  . Fatigue 06/06/2019  . Hearing loss 10/15/2018  . High risk medication use 07/30/2015  . Long-term use of aspirin therapy 10/15/2018  . Malaise and fatigue 10/25/2018  . Mild cognitive impairment 12/10/2018   Formatting of this note might be different from the original. Okay on  eval  . 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   Last Assessment & Plan:  Relevant Hx: Course: Daily Update: Today's Plan:will refill his med for him today and he is stable with this  Electronically signed by: [name], FNP 07/30/15 [phone]  . Mixed hyperlipidemia 07/30/2015  . Paroxysmal atrial fibrillation (HCC) 05/06/2019  . Permanent atrial fibrillation (HCC) 10/08/2018  . Persistent atrial fibrillation, status post cardioversion in July 2020 about flip back to atrial fibrillation a few weeks later 01/21/2019  . Postural dizziness with presyncope 10/25/2018  . Prediabetes 07/30/2015  . Right shoulder injury 10/08/2018  . S/P arthroscopy of right shoulder 03/12/2019  . S/P rotator cuff repair 03/12/2019  . Tear of right supraspinatus tendon 10/25/2018  . Tremor 07/30/2015  . Vitamin B 12 deficiency 07/30/2015  . White matter disease, unspecified 11/19/2018   Noted on MRI of brain 2020. Discussed with pt and wife.    Past Surgical History:  Procedure Laterality Date  . BACK SURGERY    . CHOLECYSTECTOMY    . HERNIA REPAIR  2009  . LEFT HEART CATH AND CORONARY ANGIOGRAPHY N/A 10/25/2019   Procedure: LEFT HEART CATH AND CORONARY ANGIOGRAPHY;  Surgeon: [place], Peter M, MD;  Location: [facility] INVASIVE CV LAB;  Service: Cardiovascular;  Laterality: N/A;  . ROTATOR CUFF REPAIR  03/07/2019  . SHOULDER SURGERY Left 12/2010   Rotater cuff  . SPINAL FUSION  1998   Dr Nadene Rubins  .  SPINE SURGERY  2001   Spinal hardware removal by Dr. Sherryle Lis    Current Medications: Current Meds  Medication Sig  . acetaminophen (TYLENOL) 500 MG tablet Take 1,000 mg by mouth every 6 (six) hours as needed for moderate pain or headache.  [name] apixaban (ELIQUIS) 5 MG TABS tablet Take 1 tablet (5 mg total) by mouth 2 (two) times daily.  . carvedilol (COREG) 6.25 MG tablet TAKE 1 TABLET BY MOUTH TWICE (2) DAILY (Patient taking differently: Take 6.25 mg by mouth 2 (two) times daily. )  .  Cholecalciferol (VITAMIN D3) 125 MCG (5000 UT) CAPS Take 5,000 Units by mouth daily.   . diclofenac Sodium (VOLTAREN) 1 % GEL Apply 1 application topically 4 (four) times daily as needed (pain).  [name] diltiazem (CARDIZEM CD) 120 MG 24 hr capsule Take 120 mg by mouth daily.   . flecainide (TAMBOCOR) 50 MG tablet TAKE 1 TABLET BY MOUTH TWICE (2) DAILY  . PARoxetine (PAXIL) 40 MG tablet Take 40 mg by mouth daily.   . vitamin B-12 (CYANOCOBALAMIN) 1000 MCG tablet Take 1,000 mcg by mouth daily.     Allergies:   Patient has no known allergies.   Social History   Socioeconomic History  . Marital status: Married    Spouse name: Not on file  . Number of children: Not on file  . Years of education: Not on file  . Highest education level: Not on file  Occupational History  . Not on file  Tobacco Use  . Smoking status: Never Smoker  . Smokeless tobacco: Never Used  Vaping Use  . Vaping Use: Never used  Substance and Sexual Activity  . Alcohol use: Yes    Comment: once in a while  . Drug use: Not Currently  . Sexual activity: Not on file  Other Topics Concern  . Not on file  Social History Narrative  . Not on file   Social Determinants of Health   Financial Resource Strain:   . Difficulty of Paying Living Expenses:   Food Insecurity:   . Worried About Charity fundraiser in the Last Year:   . Arboriculturist in the Last Year:   Transportation Needs:   . Film/video editor (Medical):   [name] Lack of Transportation (Non-Medical):   Physical Activity:   . Days of Exercise per Week:   . Minutes of Exercise per Session:   Stress:   . Feeling of Stress :   Social Connections:   . Frequency of Communication with Friends and Family:   . Frequency of Social Gatherings with Friends and Family:   . Attends Religious Services:   . Active Member of Clubs or Organizations:   . Attends Archivist Meetings:   [name] Marital Status:      Family History: The patient's family history  includes Breast cancer in his mother; Dementia in his maternal grandmother and mother; Hypertension in his father and mother; Multiple sclerosis in his son; Prostate cancer in his maternal grandfather. ROS:   Please see the history of present illness.    All 14 point review of systems negative except as described per history of present illness  EKGs/Labs/Other Studies Reviewed:      Recent Labs: 04/22/2019: Magnesium 2.4 06/06/2019: TSH 1.030 10/21/2019: BUN 17; Creatinine, Ser 1.01; Hemoglobin 16.0; Platelets 261; Potassium 4.4; Sodium 139  Recent Lipid Panel No results found for: CHOL, TRIG, HDL, CHOLHDL, VLDL, LDLCALC, LDLDIRECT  Physical Exam:    VS:  BP 132/68 (BP Location: Left Arm, Patient Position: Sitting, Cuff Size: Normal)   Pulse (!) 50   Ht 6' (1.829 m)   Wt 222 lb 9.6 oz (101 kg)   SpO2 96%   BMI 30.19 kg/m     Wt Readings from Last 3 Encounters:  12/02/19 222 lb 9.6 oz (101 kg)  10/25/19 218 lb (98.9 kg)  10/24/19 218 lb (98.9 kg)     GEN:  Well nourished, well developed in no acute distress HEENT: Normal NECK: No JVD; No carotid bruits LYMPHATICS: No lymphadenopathy CARDIAC: RRR, no murmurs, no rubs, no gallops RESPIRATORY:  Clear to auscultation without rales, wheezing or rhonchi  ABDOMEN: Soft, non-tender, non-distended MUSCULOSKELETAL:  No edema; No deformity  SKIN: Warm and dry LOWER EXTREMITIES: no swelling NEUROLOGIC:  Alert and oriented x 3 PSYCHIATRIC:  Normal affect   ASSESSMENT:    1. Permanent atrial fibrillation (HCC)   2. Coronary artery disease involving native coronary artery of native heart without angina pectoris   3. Abnormal stress test   4. Mixed hyperlipidemia    PLAN:    In order of problems listed above:  1. Paroxysmal atrial fibrillation, he is on flecainide as well as Eliquis which I will continue.  I will cut down his Coreg from 6.25-3.125.  This is because of bradycardia. 2. Coronary artery disease cardiac  catheterization reviewed with the patient 45% proximal LAD decrease risk factors modifications.  Wound after his cardiac catheterization in the right wrist is healing well.  Pulses still present.  Overall he is feeling well. 3. Abnormal stress test which was most likely falsely abnormal. 4. Dyslipidemia: I did review his fasting lipid profile done by his primary care physician his LDL was 148 his HDL 46 and try total cholesterol was 204.  We did calculate his 10 years risk which came as 16.5% which is intermediate group.  He need to be at least on moderate intensity statin therefore, I will put him on Crestor 10 mg daily he is scheduled to see his primary care physician within next few weeks he will have fasting lipid profile repeated.   Medication Adjustments/Labs and Tests Ordered: Current medicines are reviewed at length with the patient today.  Concerns regarding medicines are outlined above.  No orders of the defined types were placed in this encounter.  Medication changes:  Meds ordered this encounter  Medications  . rosuvastatin (CRESTOR) 10 MG tablet    Sig: Take 1 tablet (10 mg total) by mouth daily.    Dispense:  90 tablet    Refill:  1    Signed, Georgeanna Lea, MD, [facility] [place] 12/02/2019 12:28 PM    [facility]

## 2020-01-24 DIAGNOSIS — E782 Mixed hyperlipidemia: Secondary | ICD-10-CM

## 2020-01-24 MED ORDER — ROSUVASTATIN CALCIUM 20 MG PO TABS: 20.0000 mg | ORAL_TABLET | Freq: Every day | ORAL | 1 refills | Status: DC

## 2020-01-30 DIAGNOSIS — M542 Cervicalgia: Secondary | ICD-10-CM

## 2020-01-30 DIAGNOSIS — M47819 Spondylosis without myelopathy or radiculopathy, site unspecified: Secondary | ICD-10-CM | POA: Insufficient documentation

## 2020-01-30 DIAGNOSIS — M47812 Spondylosis without myelopathy or radiculopathy, cervical region: Secondary | ICD-10-CM | POA: Insufficient documentation

## 2020-01-30 HISTORY — DX: Spondylosis without myelopathy or radiculopathy, cervical region: M47.812

## 2020-01-30 HISTORY — DX: Cervicalgia: M54.2

## 2020-02-12 ENCOUNTER — Telehealth: Payer: Self-pay | Admitting: Emergency Medicine

## 2020-02-12 NOTE — Telephone Encounter (Signed)
    Pt's wife returning call, she said she doesn't have her caller block on. She ask to call her [phone]

## 2020-02-12 NOTE — Telephone Encounter (Signed)
Pharmacy please comment on request to hold Eliquis in this patient and then I will contact him for clearance.  Corine Shelter PA-C 02/12/2020 11:26 AM

## 2020-02-12 NOTE — Telephone Encounter (Signed)
   Primary Cardiologist: Dr Bing Matter  Chart reviewed and patient contacted today (his wife) by phone as part of pre-operative protocol coverage. Given past medical history and time since last visit, based on ACC/AHA guidelines, KAMERIN GRUMBINE would be at acceptable risk for the planned procedure without further cardiovascular testing.   OK to hold Eliquis 3 days pre op- resume as soon as safe post op. This was relayed to Ms Bill.   The patient was advised that if he develops new symptoms prior to surgery to contact our office to arrange for a follow-up visit, and he verbalized understanding.  I will route this recommendation to the requesting party via Epic fax function and remove from pre-op pool.  Please call with questions.  Corine Shelter, PA-C 02/12/2020, 2:08 PM

## 2020-02-12 NOTE — Telephone Encounter (Signed)
Left message to call back  Corine Shelter PA-C 02/12/2020 1:13 PM

## 2020-02-12 NOTE — Telephone Encounter (Signed)
Call block is on- goes straight to voicemail.  Pleas ask them to turn this if they call back.  Corine Shelter PA-C 02/12/2020 1:30 PM

## 2020-02-12 NOTE — Telephone Encounter (Signed)
Patient's wife is returning call. 

## 2020-02-12 NOTE — Telephone Encounter (Signed)
Patient with diagnosis of afib on Eliquis for anticoagulation.    Procedure: Cervical nerve block. 2 nerve blocks, 2 radiofrequency ablations 2 weeks apart over the next 6-8 weeks   Date of procedure: 02/17/20  CHADS2-VASc score of 3 (age, CHF, CAD)  CrCl >128mL/min Platelet count 261K  Per office protocol, patient can hold Eliquis for 3 days prior to upcoming procedures.

## 2020-02-12 NOTE — Telephone Encounter (Signed)
° °  [facility] Pre-operative Risk Assessment    HEARTCARE STAFF: - Please ensure there is not already an duplicate clearance open for this procedure. - Under Visit Info/Reason for Call, type in Other and utilize the format Clearance MM/DD/YY or Clearance TBD. Do not use dashes or single digits. - If request is for dental extraction, please clarify the # of teeth to be extracted.  Request for surgical clearance:  1. What type of surgery is being performed? Cervical nerve block. 2 nerve blocks, 2 radiofrequency ablations  2 weeks apart over the next 6-8 weeks   2. When is this surgery scheduled? 02/17/1920  3. What type of clearance is required (medical clearance vs. Pharmacy clearance to hold med vs. Both)? both  4. Are there any medications that need to be held prior to surgery and how long? Eliquis  3 days before    5. Practice name and name of physician performing surgery? [place]   7. What is the office phone number? [phone]   7.   What is the office fax number? [phone]  8.   Anesthesia type (None, local, MAC, general) ? Not specified    Richard Spence 02/12/2020, 11:02 AM  _________________________________________________________________   (provider comments below)

## 2020-02-27 NOTE — Telephone Encounter (Signed)
Received a fax today stating that the patient's procedure will involve a total of 4 visits over the next 6-8 weeks and will need to hold anticoagulation prior to each visit.

## 2020-02-27 NOTE — Telephone Encounter (Signed)
Tried to call [facility] hours 7am - 430pm. Will have to try again tomorrow.

## 2020-02-28 ENCOUNTER — Other Ambulatory Visit: Payer: Self-pay | Admitting: Adult Health

## 2020-02-28 NOTE — Telephone Encounter (Signed)
Per Joni Reining, DNP: She s/w Pharm-D who stated we don't do standing orders like that. Samara Deist called requesting office back and explained that they will have to send [place] another request for each procedure. Per Joni Reining, DNP and Pharm-D I think that is reasonable for anticoaulant therapy.   I will remove from the pre op call back pool as clearance has been sent over for the procedure to be done at this time, though requesting office is aware will  Need to send request for each procedure.

## 2020-02-28 NOTE — Telephone Encounter (Signed)
Per Joni Reining, DNP: Pharm-D said we don't do standing orders like that. I just called requesting office and explained they they will have to send [place] another request. I think that is reasonable for anticoaulant therapy  I will remove from the pre op call back at this time as the clearance seems to have been given for this one time though requesting office is aware will need to send new request for each procedure.

## 2020-03-03 ENCOUNTER — Telehealth: Payer: Self-pay | Admitting: Emergency Medicine

## 2020-03-03 NOTE — Telephone Encounter (Signed)
   [facility] Pre-operative Risk Assessment    HEARTCARE STAFF: - Please ensure there is not already an duplicate clearance open for this procedure. - Under Visit Info/Reason for Call, type in Other and utilize the format Clearance MM/DD/YY or Clearance TBD. Do not use dashes or single digits. - If request is for dental extraction, please clarify the # of teeth to be extracted.  Request for surgical clearance:  1. What type of surgery is being performed? Radiofrequency medial depervation cervival    When is this surgery scheduled? 03/16/20  2. What type of clearance is required (medical clearance vs. Pharmacy clearance to hold med vs. Both)? both  Are there any medications that need to be held prior to surgery and how long? Eliquis 3 days before  3. Practice name and name of physician performing surgery? [facility]    4. What is the office phone number? [phone]   7.   What is the office fax number? [phone]  8.   Anesthesia type (None, local, MAC, general) ? Unspecified    Senaida Ores 03/03/2020, 1:38 PM  _________________________________________________________________   (provider comments below)

## 2020-03-03 NOTE — Telephone Encounter (Signed)
Patient with diagnosis of A Fib on Eliquis for anticoagulation.    Procedure: RADIOFREQUENCY MEDIAL DENERVATION LUMBAR-THORACIC-CERVICAL Date of procedure: 03/16/20  CHADS2-VASc score of  3 (CHF, CAD, AGE)  CrCl 89.7 mL/min using adjusted body weight Platelet count  261K  Per office protocol, patient can hold Eliquis for 3 days prior to procedure.    If not bridging, patient should restart Eliquis on the evening of procedure or day after, at discretion of procedure MD

## 2020-03-06 LAB — LIPID PANEL
Chol/HDL Ratio: 2.4 ratio (ref 0.0–5.0)
Cholesterol, Total: 121 mg/dL (ref 100–199)
HDL: 51 mg/dL (ref >39)
LDL Chol Calc (NIH): 53 mg/dL (ref 0–99)
Triglycerides: 91 mg/dL (ref 0–149)
VLDL Cholesterol Cal: 17 mg/dL (ref 5–40)

## 2020-03-06 NOTE — Addendum Note (Signed)
Addended by: Hazle Quant on: 03/06/2020 09:16 AM   Modules accepted: Orders

## 2020-03-23 ENCOUNTER — Ambulatory Visit: Payer: Medicare HMO | Admitting: Cardiology

## 2020-03-23 ENCOUNTER — Encounter: Payer: Self-pay | Admitting: Cardiology

## 2020-03-23 ENCOUNTER — Other Ambulatory Visit: Payer: Self-pay

## 2020-03-23 VITALS — BP 108/60 | HR 50 | Ht 72.0 in | Wt 221.6 lb

## 2020-03-23 DIAGNOSIS — I420 Dilated cardiomyopathy: Secondary | ICD-10-CM

## 2020-03-23 DIAGNOSIS — I25118 Atherosclerotic heart disease of native coronary artery with other forms of angina pectoris: Secondary | ICD-10-CM

## 2020-03-23 DIAGNOSIS — I480 Paroxysmal atrial fibrillation: Secondary | ICD-10-CM | POA: Diagnosis not present

## 2020-03-23 NOTE — Addendum Note (Signed)
Addended by: Hazle Quant on: 03/23/2020 02:58 PM   Modules accepted: Orders

## 2020-03-23 NOTE — Patient Instructions (Signed)
Medication Instructions:  Your physician recommends that you continue on your current medications as directed. Please refer to the Current Medication list given to you today.  *If you need a refill on your cardiac medications before your next appointment, please call your pharmacy*   Lab Work: None. If you have labs (blood work) drawn today and your tests are completely normal, you will receive your results only by: . MyChart Message (if you have MyChart) OR . A paper copy in the mail If you have any lab test that is abnormal or we need to change your treatment, we will call you to review the results.   Testing/Procedures: none   Follow-Up: At [facility], you and your health needs are our priority.  As part of our continuing mission to provide you with exceptional heart care, we have created designated Provider Care Teams.  These Care Teams include your primary Cardiologist (physician) and Advanced Practice Providers (APPs -  Physician Assistants and Nurse Practitioners) who all work together to provide you with the care you need, when you need it.  We recommend signing up for the patient portal called "MyChart".  Sign up information is provided on this After Visit Summary.  MyChart is used to connect with patients for Virtual Visits (Telemedicine).  Patients are able to view lab/test results, encounter notes, upcoming appointments, etc.  Non-urgent messages can be sent to your provider as well.   To learn more about what you can do with MyChart, go to [email or web address].    Your next appointment:   5 month(s)  The format for your next appointment:   In Person  Provider:   Robert Krasowski, MD   Other Instructions   

## 2020-03-23 NOTE — Progress Notes (Signed)
Cardiology Office Note:    Date:  03/23/2020   ID:  Richard Spence, DOB January 24, 1956, MRN [id number]  PCP:  Richard Spence., MD  Cardiologist:  Richard Balsam, MD    Referring MD: Richard Spence., MD   No chief complaint on file. I am doing well  History of Present Illness:    Richard Spence is a 64 y.o. male  with past medical history significant for paroxysmal atrial fibrillation, recent stress test being abnormal he did have a cardiac catheterization done which showed only 45% stenosis of proximal LAD.  He also have history of cardiomyopathy with ejection fraction before 25 to 30% couple years ago but latest echocardiogram done in December 2020 showed ejection fraction 50 to 55%.  Comes today to my office for follow-up.  To begin difficulty he has right now is a problem with the neck.  He is getting some injection over there and recently had ablation done and feeling much better.  Denies have any palpitation, tightness squeezing pressure burning chest.  He thinks he does have a problem with Crestor since increased dose from 10 to 20 mg he does have pain in small joints in his hands.  Past Medical History:  Diagnosis Date  . Altered mental status 10/25/2018  . Atrial fibrillation with rapid ventricular response (HCC) 10/08/2018  . Cardiomyopathy (HCC) 10/25/2018   EF 25-30% on ST. 40% on ECHO 2020.  LVH  . Degeneration of lumbar intervertebral disc 07/30/2015  . DOE (dyspnea on exertion) 10/16/2018  . Fatigue 06/06/2019  . Hearing loss 10/15/2018  . High risk medication use 07/30/2015  . Long-term use of aspirin therapy 10/15/2018  . Malaise and fatigue 10/25/2018  . Mild cognitive impairment 12/10/2018   Formatting of this note might be different from the original. Okay on eval  . 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   Last Assessment & Plan:  Relevant Hx: Course: Daily Update: Today's Plan:will refill his med for him today and he is stable with this   Electronically signed by: Richard Mages, FNP 07/30/15 [phone]  . Mixed hyperlipidemia 07/30/2015  . Paroxysmal atrial fibrillation (HCC) 05/06/2019  . Permanent atrial fibrillation (HCC) 10/08/2018  . Persistent atrial fibrillation, status post cardioversion in July 2020 about flip back to atrial fibrillation a few weeks later 01/21/2019  . Postural dizziness with presyncope 10/25/2018  . Prediabetes 07/30/2015  . Right shoulder injury 10/08/2018  . S/P arthroscopy of right shoulder 03/12/2019  . S/P rotator cuff repair 03/12/2019  . Tear of right supraspinatus tendon 10/25/2018  . Tremor 07/30/2015  . Vitamin B 12 deficiency 07/30/2015  . White matter disease, unspecified 11/19/2018   Noted on MRI of brain 2020. Discussed with pt and wife.    Past Surgical History:  Procedure Laterality Date  . BACK SURGERY    . CHOLECYSTECTOMY    . HERNIA REPAIR  2009  . LEFT HEART CATH AND CORONARY ANGIOGRAPHY N/A 10/25/2019   Procedure: LEFT HEART CATH AND CORONARY ANGIOGRAPHY;  Surgeon: [place], Peter M, MD;  Location: [facility] INVASIVE CV LAB;  Service: Cardiovascular;  Laterality: N/A;  . ROTATOR CUFF REPAIR  03/07/2019  . SHOULDER SURGERY Left 12/2010   Rotater cuff  . SPINAL FUSION  1998   Dr Nadene Rubins  . SPINE SURGERY  2001   Spinal hardware removal by Dr. Lilian Kapur    Current Medications: Current Meds  Medication Sig  . acetaminophen (TYLENOL) 500 MG tablet Take 1,000 mg by mouth every  6 (six) hours as needed for moderate pain or headache.  [name] apixaban (ELIQUIS) 5 MG TABS tablet Take 1 tablet (5 mg total) by mouth 2 (two) times daily.  . carvedilol (COREG) 6.25 MG tablet TAKE 1 TABLET BY MOUTH TWICE (2) DAILY (Patient taking differently: Take 6.25 mg by mouth 2 (two) times daily. )  . Cholecalciferol (VITAMIN D3) 125 MCG (5000 UT) CAPS Take 5,000 Units by mouth daily.   . diclofenac Sodium (VOLTAREN) 1 % GEL Apply 1 application topically 4 (four) times daily as needed (pain).  . flecainide (TAMBOCOR)  50 MG tablet TAKE 1 TABLET BY MOUTH TWICE (2) DAILY  . PARoxetine (PAXIL) 40 MG tablet Take 40 mg by mouth daily.   . rosuvastatin (CRESTOR) 10 MG tablet Take 10 mg by mouth at bedtime.  . vitamin B-12 (CYANOCOBALAMIN) 1000 MCG tablet Take 1,000 mcg by mouth daily.     Allergies:   Patient has no known allergies.   Social History   Socioeconomic History  . Marital status: Married    Spouse name: Not on file  . Number of children: Not on file  . Years of education: Not on file  . Highest education level: Not on file  Occupational History  . Not on file  Tobacco Use  . Smoking status: Never Smoker  . Smokeless tobacco: Never Used  Vaping Use  . Vaping Use: Never used  Substance and Sexual Activity  . Alcohol use: Yes    Comment: once in a while  . Drug use: Not Currently  . Sexual activity: Not on file  Other Topics Concern  . Not on file  Social History Narrative  . Not on file   Social Determinants of Health   Financial Resource Strain:   . Difficulty of Paying Living Expenses: Not on file  Food Insecurity:   . Worried About Programme researcher, broadcasting/film/video in the Last Year: Not on file  . Ran Out of Food in the Last Year: Not on file  Transportation Needs:   . Lack of Transportation (Medical): Not on file  . Lack of Transportation (Non-Medical): Not on file  Physical Activity:   . Days of Exercise per Week: Not on file  . Minutes of Exercise per Session: Not on file  Stress:   . Feeling of Stress : Not on file  Social Connections:   . Frequency of Communication with Friends and Family: Not on file  . Frequency of Social Gatherings with Friends and Family: Not on file  . Attends Religious Services: Not on file  . Active Member of Clubs or Organizations: Not on file  . Attends Banker Meetings: Not on file  . Marital Status: Not on file     Family History: The patient's family history includes Breast cancer in his mother; Dementia in his maternal grandmother  and mother; Hypertension in his father and mother; Multiple sclerosis in his son; Prostate cancer in his maternal grandfather. ROS:   Please see the history of present illness.    All 14 point review of systems negative except as described per history of present illness  EKGs/Labs/Other Studies Reviewed:      Recent Labs: 04/22/2019: Magnesium 2.4 06/06/2019: TSH 1.030 10/21/2019: BUN 17; Creatinine, Ser 1.01; Hemoglobin 16.0; Platelets 261; Potassium 4.4; Sodium 139  Recent Lipid Panel    Component Value Date/Time   CHOL 121 03/06/2020 0923   TRIG 91 03/06/2020 0923   HDL 51 03/06/2020 0923   CHOLHDL 2.4 03/06/2020  5009   LDLCALC 53 03/06/2020 0923    Physical Exam:    VS:  BP 108/60   Pulse (!) 50   Ht 6' (1.829 m)   Wt 221 lb 9.6 oz (100.5 kg)   SpO2 97%   BMI 30.05 kg/m     Wt Readings from Last 3 Encounters:  03/23/20 221 lb 9.6 oz (100.5 kg)  12/02/19 222 lb 9.6 oz (101 kg)  10/25/19 218 lb (98.9 kg)     GEN:  Well nourished, well developed in no acute distress HEENT: Normal NECK: No JVD; No carotid bruits LYMPHATICS: No lymphadenopathy CARDIAC: RRR, no murmurs, no rubs, no gallops RESPIRATORY:  Clear to auscultation without rales, wheezing or rhonchi  ABDOMEN: Soft, non-tender, non-distended MUSCULOSKELETAL:  No edema; No deformity  SKIN: Warm and dry LOWER EXTREMITIES: no swelling NEUROLOGIC:  Alert and oriented x 3 PSYCHIATRIC:  Normal affect   ASSESSMENT:    1. Paroxysmal atrial fibrillation (HCC)   2. Coronary artery disease of native artery of native heart with stable angina pectoris (HCC)   3. Dilated cardiomyopathy (HCC)    PLAN:    In order of problems listed above:  1. Paroxysmal atrial fibrillation.  EKG will be done today to confirm the rhythm.  Seems to be controlled with flecainide, he is also on Eliquis which I will continue.  EKG will be done today also to confirm the QRS complex duration morphology. 2. Coronary artery disease with  45% stenosis of the proximal LAD.  I will continue risk modifications.  He is not on aspirin since there was no recent coronary events, he is on Crestor 20 however he requested reducing the dose to 10 mg because of symptoms that he does have I with the joint aches and he thinks it is related to his Crestor.  We will try temporarily reduce the dose. 3. History of dilated cardiomyopathy, cardiac catheterization showed normal left ventricle ejection fraction.  He is on appropriate medication which I will continue. 4. Dyslipidemia I did review his K PN from [facility] 2 weeks ago which showed LDL of 53 HDL 51 that was an excellent cholesterol profile, however he requested reduction of his statin therapy.  We will do temporarily and then will decide what will be the next step.   Medication Adjustments/Labs and Tests Ordered: Current medicines are reviewed at length with the patient today.  Concerns regarding medicines are outlined above.  No orders of the defined types were placed in this encounter.  Medication changes: No orders of the defined types were placed in this encounter.   Signed, Georgeanna Lea, MD, [facility] 03/23/2020 2:31 PM    [facility]

## 2020-04-14 DIAGNOSIS — M25562 Pain in left knee: Secondary | ICD-10-CM

## 2020-04-14 HISTORY — DX: Pain in left knee: M25.562

## 2020-04-27 DIAGNOSIS — G894 Chronic pain syndrome: Secondary | ICD-10-CM

## 2020-04-27 DIAGNOSIS — M961 Postlaminectomy syndrome, not elsewhere classified: Secondary | ICD-10-CM | POA: Insufficient documentation

## 2020-04-27 DIAGNOSIS — G8929 Other chronic pain: Secondary | ICD-10-CM

## 2020-04-27 HISTORY — DX: Chronic pain syndrome: G89.4

## 2020-04-27 HISTORY — DX: Postlaminectomy syndrome, not elsewhere classified: M96.1

## 2020-04-27 HISTORY — DX: Other chronic pain: G89.29

## 2020-07-27 DIAGNOSIS — M533 Sacrococcygeal disorders, not elsewhere classified: Secondary | ICD-10-CM

## 2020-07-27 HISTORY — DX: Sacrococcygeal disorders, not elsewhere classified: M53.3

## 2020-08-21 ENCOUNTER — Other Ambulatory Visit: Payer: Self-pay

## 2020-08-24 ENCOUNTER — Ambulatory Visit: Payer: Medicare HMO | Admitting: Cardiology

## 2020-08-24 ENCOUNTER — Other Ambulatory Visit: Payer: Self-pay

## 2020-08-24 ENCOUNTER — Encounter: Payer: Self-pay | Admitting: Cardiology

## 2020-08-24 VITALS — BP 122/80 | HR 57 | Ht 72.0 in | Wt 218.0 lb

## 2020-08-24 DIAGNOSIS — I4821 Permanent atrial fibrillation: Secondary | ICD-10-CM

## 2020-08-24 DIAGNOSIS — I25118 Atherosclerotic heart disease of native coronary artery with other forms of angina pectoris: Secondary | ICD-10-CM | POA: Diagnosis not present

## 2020-08-24 DIAGNOSIS — E782 Mixed hyperlipidemia: Secondary | ICD-10-CM | POA: Diagnosis not present

## 2020-08-24 NOTE — Progress Notes (Signed)
Kg   

## 2020-08-24 NOTE — Progress Notes (Signed)
Cardiology Office Note:    Date:  08/24/2020   ID:  Richard Spence, DOB 23-Jan-1956, MRN [id number]  PCP:  [name], MD  Cardiologist:  [name], MD    Referring MD: [name], MD   Chief Complaint  Patient presents with  . Follow-up  I am doing fine cardiomyopathy  History of Present Illness:    Richard Spence is a 65 y.o. male with past medical history significant for paroxysmal atrial fibrillation, he is suppressed with flecainide 50 mg twice daily, anticoagulated with Eliquis 5 mg twice daily, chads 2 Vascor equals 2.  Essential hypertension, coronary artery disease with cardiac catheterization showing 45% stenosis of proximal LAD.  He also got history of cardiomyopathy with 20 to 30% ejection fraction years ago however echocardiogram done in December 2020 showed ejection fraction 50 to 55% on cardiac catheterization done last year showed normal left ventricle ejection fraction. He comes today 2 months for follow-up.  Overall he is doing great, he is asymptomatic, no chest pain tightness squeezing pressure burning chest, but admit not doing much because of wintertime.  He is getting ready to cut the grass and be more active outdoors..  Past Medical History:  Diagnosis Date  . Abnormal stress test 10/21/2019  . Acute pain of left knee 04/14/2020  . Altered mental status 10/25/2018  . Atrial fibrillation with rapid ventricular response (HCC) 10/08/2018  . Atrial fibrillation with rapid ventricular response (HCC) 10/08/2018  . Cardiomyopathy (HCC) 10/25/2018   EF 25-30% on ST. 40% on ECHO 2020.  LVH  . Chronic pain syndrome 04/27/2020  . Coronary artery disease of native artery of native heart with stable angina pectoris (HCC) 12/02/2019   Formatting of this note might be different from the original. 10/2019. Cath at [facility] LAD 45%. EF 55-60%  . Degeneration of lumbar intervertebral disc 07/30/2015  . DOE (dyspnea on exertion) 10/16/2018  . Facet arthropathy, cervical  01/30/2020   Formatting of this note might be different from the original. Added automatically from request for surgery [id number]  Formatting of this note might be different from the original. Added automatically from request for surgery [id number]  . Fatigue 06/06/2019  . Hearing loss 10/15/2018  . High risk medication use 07/30/2015  . Long-term use of aspirin therapy 10/15/2018  . Malaise and fatigue 10/25/2018  . Mild cognitive impairment 12/10/2018   Formatting of this note might be different from the original. Okay on eval  . 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   Last Assessment & Plan:  Relevant Hx: Course: Daily Update: Today's Plan:will refill his med for him today and he is stable with this  Electronically signed by: [name], FNP 07/30/15 [phone]  . Mixed hyperlipidemia 07/30/2015  . Pain of cervical facet joint 01/30/2020   Formatting of this note might be different from the original. Added automatically from request for surgery [id number]  . Paroxysmal atrial fibrillation (HCC) 05/06/2019  . Permanent atrial fibrillation (HCC) 10/08/2018  . Persistent atrial fibrillation, status post cardioversion in July 2020 about flip back to atrial fibrillation a few weeks later 01/21/2019  . Postlaminectomy syndrome of lumbar region 04/27/2020  . Postural dizziness with presyncope 10/25/2018  . Prediabetes 07/30/2015  . Right shoulder injury 10/08/2018  . S/P arthroscopy of right shoulder 03/12/2019  . S/P rotator cuff repair 03/12/2019  . SI (sacroiliac) pain 07/27/2020  . Tear of right supraspinatus tendon 10/25/2018  . Tremor 07/30/2015  . Vitamin B 12  deficiency 07/30/2015  . White matter disease, unspecified 11/19/2018   Noted on MRI of brain 2020. Discussed with pt and wife.    Past Surgical History:  Procedure Laterality Date  . BACK SURGERY    . CHOLECYSTECTOMY    . HERNIA REPAIR  2009  . LEFT HEART CATH AND CORONARY ANGIOGRAPHY N/A 10/25/2019   Procedure:  LEFT HEART CATH AND CORONARY ANGIOGRAPHY;  Surgeon: Swaziland, Peter M, MD;  [facility] INVASIVE CV LAB;  Service: Cardiovascular;  Laterality: N/A;  . ROTATOR CUFF REPAIR  03/07/2019  . SHOULDER SURGERY Left 12/2010   Rotater cuff  . SPINAL FUSION  1998   Dr Nadene Rubins  . SPINE SURGERY  2001   Spinal hardware removal by Dr. Lilian Kapur    Current Medications: Current Meds  Medication Sig  . acetaminophen (TYLENOL) 500 MG tablet Take 1,000 mg by mouth every 6 (six) hours as needed for moderate pain or headache.  [name] apixaban (ELIQUIS) 5 MG TABS tablet Take 1 tablet (5 mg total) by mouth 2 (two) times daily.  . carvedilol (COREG) 6.25 MG tablet TAKE 1 TABLET BY MOUTH TWICE (2) DAILY (Patient taking differently: Take 6.25 mg by mouth 2 (two) times daily.)  . Cholecalciferol (VITAMIN D3) 125 MCG (5000 UT) CAPS Take 5,000 Units by mouth daily.   . diclofenac Sodium (VOLTAREN) 1 % GEL Apply 1 application topically 4 (four) times daily as needed (pain).  [name] diltiazem (CARDIZEM CD) 120 MG 24 hr capsule Take 120 mg by mouth daily.   . flecainide (TAMBOCOR) 50 MG tablet TAKE 1 TABLET BY MOUTH TWICE (2) DAILY  . PARoxetine (PAXIL) 40 MG tablet Take 40 mg by mouth daily.   . pregabalin (LYRICA) 75 MG capsule Take 1 capsule by mouth 2 (two) times daily.  . rosuvastatin (CRESTOR) 20 MG tablet Take 1 tablet by mouth daily.  . vitamin B-12 (CYANOCOBALAMIN) 1000 MCG tablet Take 1,000 mcg by mouth daily.     Allergies:   Patient has no known allergies.   Social History   Socioeconomic History  . Marital status: Married    Spouse name: Not on file  . Number of children: Not on file  . Years of education: Not on file  . Highest education level: Not on file  Occupational History  . Not on file  Tobacco Use  . Smoking status: Never Smoker  . Smokeless tobacco: Never Used  Vaping Use  . Vaping Use: Never used  Substance and Sexual Activity  . Alcohol use: Yes    Comment: once in a while  . Drug use: Not  Currently  . Sexual activity: Not on file  Other Topics Concern  . Not on file  Social History Narrative  . Not on file   Social Determinants of Health   Financial Resource Strain: Not on file  Food Insecurity: Not on file  Transportation Needs: Not on file  Physical Activity: Not on file  Stress: Not on file  Social Connections: Not on file     Family History: The patient's family history includes Breast cancer in his mother; Dementia in his maternal grandmother and mother; Hypertension in his father and mother; Multiple sclerosis in his son; Prostate cancer in his maternal grandfather. ROS:   Please see the history of present illness.    All 14 point review of systems negative except as described per history of present illness  EKGs/Labs/Other Studies Reviewed:      Recent Labs: 10/21/2019: BUN 17; Creatinine, Ser 1.01;  Hemoglobin 16.0; Platelets 261; Potassium 4.4; Sodium 139  Recent Lipid Panel    Component Value Date/Time   CHOL 121 03/06/2020 0923   TRIG 91 03/06/2020 0923   HDL 51 03/06/2020 0923   CHOLHDL 2.4 03/06/2020 0923   LDLCALC 53 03/06/2020 0923    Physical Exam:    VS:  BP 122/80 (BP Location: Right Arm, Patient Position: Sitting)   Pulse (!) 57   Ht 6' (1.829 m)   Wt 218 lb (98.9 kg)   SpO2 96%   BMI 29.57 kg/m     Wt Readings from Last 3 Encounters:  08/24/20 218 lb (98.9 kg)  03/23/20 221 lb 9.6 oz (100.5 kg)  12/02/19 222 lb 9.6 oz (101 kg)     GEN:  Well nourished, well developed in no acute distress HEENT: Normal NECK: No JVD; No carotid bruits LYMPHATICS: No lymphadenopathy CARDIAC: RRR, no murmurs, no rubs, no gallops RESPIRATORY:  Clear to auscultation without rales, wheezing or rhonchi  ABDOMEN: Soft, non-tender, non-distended MUSCULOSKELETAL:  No edema; No deformity  SKIN: Warm and dry LOWER EXTREMITIES: no swelling NEUROLOGIC:  Alert and oriented x 3 PSYCHIATRIC:  Normal affect   ASSESSMENT:    1. Permanent atrial  fibrillation (HCC)   2. Coronary artery disease of native artery of native heart with stable angina pectoris (HCC)   3. Mixed hyperlipidemia    PLAN:    In order of problems listed above:  1. Paroxysmal atrial fibrillation, patient maintaining sinus rhythm based on EKG today we will continue flecainide as well as anticoagulation. 2. Coronary artery disease with 45% proximal's LAD stenosis.  Continue present management.  He is not on aspirin since there was no recent cardiac events, I did review his K PN which show me his LDL of 52 HDL 51 we will continue present management. 3. Mixed dyslipidemia data as above.  Stable.  Continue present management. 4. I encouraged him to be more active.  We will follow up in 6 months   Medication Adjustments/Labs and Tests Ordered: Current medicines are reviewed at length with the patient today.  Concerns regarding medicines are outlined above.  Orders Placed This Encounter  Procedures  . EKG 12-Lead   Medication changes: No orders of the defined types were placed in this encounter.   Signed, Georgeanna Lea, MD, [facility] 08/24/2020 11:22 AM    [facility]

## 2020-08-24 NOTE — Patient Instructions (Signed)

## 2020-09-11 ENCOUNTER — Other Ambulatory Visit: Payer: Self-pay | Admitting: Cardiology

## 2020-09-14 NOTE — Telephone Encounter (Signed)
Flecainide approved and sent

## 2020-10-13 ENCOUNTER — Other Ambulatory Visit: Payer: Self-pay | Admitting: Cardiology

## 2020-11-03 MED ORDER — CARVEDILOL 6.25 MG PO TABS: 6.2500 mg | ORAL_TABLET | Freq: Two times a day (BID) | ORAL | 2 refills | Status: DC

## 2021-01-21 ENCOUNTER — Telehealth: Payer: Self-pay | Admitting: Cardiology

## 2021-01-21 NOTE — Telephone Encounter (Signed)
Spoke to the patients wife just now. She tells me that her husband has been extremely short of breath and this is very abnormal for him. She states it just started this week. He is so short of breath and out of energy that he feels he is going to fall down when he is walking. I advised that he needs to be seen in the emergency room as this could be something urgent. She is wanting to know if Dr. Bing Matter would see him at [facility] and I let her know that if they put in the cardiology consult he would see him in the hospital.

## 2021-01-21 NOTE — Telephone Encounter (Signed)
Pt c/o Shortness Of Breath: STAT if SOB developed within the last 24 hours or pt is noticeably SOB on the phone  1. Are you currently SOB (can you hear that pt is SOB on the phone)? Wife is calling, she says he is short of breath at this pt  2. How long have you been experiencing SOB? This week  3. Are you SOB when sitting or when up moving around?   4. Are you currently experiencing any other symptoms? No energy and feels like he  is going to fall over when he is walking- patient would like to be seen today

## 2021-01-22 DIAGNOSIS — R079 Chest pain, unspecified: Secondary | ICD-10-CM | POA: Diagnosis not present

## 2021-01-26 ENCOUNTER — Telehealth: Payer: Self-pay

## 2021-01-26 NOTE — Telephone Encounter (Signed)
Spoke with the patient wife Aurea Graff, she stated the patient and her are out of town and wont be back until Sunday. I schedule him on Monday. Aurea Graff is aware of this appt.

## 2021-02-01 ENCOUNTER — Other Ambulatory Visit: Payer: Self-pay

## 2021-02-01 ENCOUNTER — Encounter: Payer: Self-pay | Admitting: Cardiology

## 2021-02-01 ENCOUNTER — Ambulatory Visit: Payer: Medicare HMO | Admitting: Cardiology

## 2021-02-01 VITALS — BP 96/50 | HR 51 | Ht 72.0 in | Wt 230.8 lb

## 2021-02-01 DIAGNOSIS — I25118 Atherosclerotic heart disease of native coronary artery with other forms of angina pectoris: Secondary | ICD-10-CM

## 2021-02-01 DIAGNOSIS — I480 Paroxysmal atrial fibrillation: Secondary | ICD-10-CM

## 2021-02-01 DIAGNOSIS — R0609 Other forms of dyspnea: Secondary | ICD-10-CM

## 2021-02-01 DIAGNOSIS — R0600 Dyspnea, unspecified: Secondary | ICD-10-CM

## 2021-02-01 MED ORDER — RANOLAZINE ER 500 MG PO TB12: 500.0000 mg | ORAL_TABLET | Freq: Two times a day (BID) | ORAL | 3 refills | Status: DC

## 2021-02-01 MED ORDER — NITROGLYCERIN 0.4 MG SL SUBL: 0.4000 mg | SUBLINGUAL_TABLET | SUBLINGUAL | 3 refills | Status: DC | PRN

## 2021-02-01 NOTE — Patient Instructions (Signed)
Medication Instructions:  Your physician has recommended you make the following change in your medication:  START: Nitroglycerin 0.4 mg take one tablet by mouth every 5 minutes up to three times as needed for chest pain.  START: Ranolazine 500 mg take one tablet by mouth twice daily.  *If you need a refill on your cardiac medications before your next appointment, please call your pharmacy*   Lab Work: Your physician recommends that you return for lab work in: Within one week of your cardiac CT  BMP  If you have labs (blood work) drawn today and your tests are completely normal, you will receive your results only by: MyChart Message (if you have MyChart) OR A paper copy in the mail If you have any lab test that is abnormal or we need to change your treatment, we will call you to review the results.   Testing/Procedures:   Your cardiac CT will be scheduled at the below location:   [facility] [address] [place], [place] [address] [phone]  If scheduled at [facility], please arrive at the [facility] main entrance (entrance A) of [facility] 30 minutes prior to test start time. Proceed to the [facility] Radiology Department (first floor) to check-in and test prep.  Please follow these instructions carefully (unless otherwise directed):  On the Night Before the Test: Be sure to Drink plenty of water. Do not consume any caffeinated/decaffeinated beverages or chocolate 12 hours prior to your test. Do not take any antihistamines 12 hours prior to your test.  On the Day of the Test: Drink plenty of water until 1 hour prior to the test. Do not eat any food 4 hours prior to the test. You may take your regular medications prior to the test.  Take metoprolol (Lopressor) two hours prior to test. FEMALES- please wear underwire-free bra if available, avoid dresses & tight clothing      After the Test: Drink plenty of water. After receiving IV  contrast, you may experience a mild flushed feeling. This is normal. On occasion, you may experience a mild rash up to 24 hours after the test. This is not dangerous. If this occurs, you can take Benadryl 25 mg and increase your fluid intake. If you experience trouble breathing, this can be serious. If it is severe call 911 IMMEDIATELY. If it is mild, please call our office. If you take any of these medications: Glipizide/Metformin, Avandament, Glucavance, please do not take 48 hours after completing test unless otherwise instructed.  Please allow 2-4 weeks for scheduling of routine cardiac CTs. Some insurance companies require a pre-authorization which may delay scheduling of this test.   For non-scheduling related questions, please contact the cardiac imaging nurse navigator should you have any questions/concerns: Rockwell Alexandria, Cardiac Imaging Nurse Navigator Larey Brick, Cardiac Imaging Nurse Navigator [facility] Direct Office Dial: [phone]   For scheduling needs, including cancellations and rescheduling, please call [place], [phone].    Follow-Up: At [facility], you and your health needs are our priority.  As part of our continuing mission to provide you with exceptional heart care, we have created designated Provider Care Teams.  These Care Teams include your primary Cardiologist (physician) and Advanced Practice Providers (APPs -  Physician Assistants and Nurse Practitioners) who all work together to provide you with the care you need, when you need it.  We recommend signing up for the patient portal called "MyChart".  Sign up information is  provided on this After Visit Summary.  MyChart is used to connect with patients for Virtual Visits (Telemedicine).  Patients are able to view lab/test results, encounter notes, upcoming appointments, etc.  Non-urgent messages can be sent to your provider as well.   To learn more about what you can do with  MyChart, go to [email or web address].    Your next appointment:   1 month(s)  The format for your next appointment:   In Person  Provider:   [name], MD   Other Instructions

## 2021-02-01 NOTE — Progress Notes (Signed)
Cardiology Office Note:    Date:  02/01/2021   ID:  Richard Spence, DOB 06-Mar-1956, MRN [id number]  PCP:  [name], MD  Cardiologist:  [name], MD    Referring MD: [name], MD   No chief complaint on file. I was in the hospital  History of Present Illness:    Richard Spence is a 65 y.o. male with past medical history significant for paroxysmal atrial fibrillation Successfully suppressed with flecainide 50 mg twice daily, he is anticoagulant Eliquis 5 mg twice daily his CHADS2 Vascor equals 2.  He also got history of essential hypertension coronary artery disease, last year Cardiac catheterization which showed 45% stenosis in the proximal LAD.  He also got history of cardiomyopathy with ejection fraction 20 to 30% years ago latest echocardiogram showed ejection fraction 50-55 this was done in 2020 recent hospitalization stress test done showed ejection fraction 62%.  Recently he ended up going to the hospital because of atypical chest pain.  Troponin I's were negative stress test was done which showed no evidence of ischemia.  He is coming here to discuss that. In spite of the fact that his stress test was negative in the hospital still complain of having chest pain.  Chest pain happened with shortness of breath and swelling recently he was at the beach and he had 2 flights of stairs to climb when he did this he was getting shortness of breath sweating and sometimes chest tightness.  While in the hospital nitroglycerin relieved the pain.  Past Medical History:  Diagnosis Date   Abnormal stress test 10/21/2019   Acute pain of left knee 04/14/2020   Altered mental status 10/25/2018   Atrial fibrillation with rapid ventricular response (HCC) 10/08/2018   Atrial fibrillation with rapid ventricular response (HCC) 10/08/2018   Cardiomyopathy (HCC) 10/25/2018   EF 25-30% on ST. 40% on ECHO 2020.  LVH   Chronic pain syndrome 04/27/2020   Coronary artery disease of native artery  of native heart with stable angina pectoris (HCC) 12/02/2019   Formatting of this note might be different from the original. 10/2019. Cath at [facility] LAD 45%. EF 55-60%   Degeneration of lumbar intervertebral disc 07/30/2015   DOE (dyspnea on exertion) 10/16/2018   Facet arthropathy, cervical 01/30/2020   Formatting of this note might be different from the original. Added automatically from request for surgery [id number]  Formatting of this note might be different from the original. Added automatically from request for surgery [id number]   Fatigue 06/06/2019   Hearing loss 10/15/2018   High risk medication use 07/30/2015   Long-term use of aspirin therapy 10/15/2018   Malaise and fatigue 10/25/2018   Mild cognitive impairment 12/10/2018   Formatting of this note might be different from the original. Okay on eval   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   Last Assessment & Plan:  Relevant Hx: Course: Daily Update: Today's Plan:will refill his med for him today and he is stable with this  Electronically signed by: [name], FNP 07/30/15 [phone]   Mixed hyperlipidemia 07/30/2015   Pain of cervical facet joint 01/30/2020   Formatting of this note might be different from the original. Added automatically from request for surgery [id number]   Paroxysmal atrial fibrillation (HCC) 05/06/2019   Permanent atrial fibrillation (HCC) 10/08/2018   Persistent atrial fibrillation, status post cardioversion in July 2020 about flip back to atrial fibrillation a few weeks later 01/21/2019   Postlaminectomy syndrome  of lumbar region 04/27/2020   Postural dizziness with presyncope 10/25/2018   Prediabetes 07/30/2015   Right shoulder injury 10/08/2018   S/P arthroscopy of right shoulder 03/12/2019   S/P rotator cuff repair 03/12/2019   SI (sacroiliac) pain 07/27/2020   Tear of right supraspinatus tendon 10/25/2018   Tremor 07/30/2015   Vitamin B 12 deficiency 07/30/2015   White matter disease, unspecified  11/19/2018   Noted on MRI of brain 2020. Discussed with pt and wife.    Past Surgical History:  Procedure Laterality Date   BACK SURGERY     CHOLECYSTECTOMY     HERNIA REPAIR  2009   LEFT HEART CATH AND CORONARY ANGIOGRAPHY N/A 10/25/2019   Procedure: LEFT HEART CATH AND CORONARY ANGIOGRAPHY;  Surgeon: Swaziland, Peter M, MD;  Location: [facility] INVASIVE CV LAB;  Service: Cardiovascular;  Laterality: N/A;   ROTATOR CUFF REPAIR  03/07/2019   SHOULDER SURGERY Left 12/2010   Rotater cuff   SPINAL FUSION  1998   Dr Nadene Rubins   [facility] SURGERY  2001   Spinal hardware removal by Dr. Lilian Kapur    Current Medications: No outpatient medications have been marked as taking for the 02/01/21 encounter (Appointment) with Georgeanna Lea, MD.     Allergies:   Patient has no known allergies.   Social History   Socioeconomic History   Marital status: Married    Spouse name: Not on file   Number of children: Not on file   Years of education: Not on file   Highest education level: Not on file  Occupational History   Not on file  Tobacco Use   Smoking status: Never   Smokeless tobacco: Never  Vaping Use   Vaping Use: Never used  Substance and Sexual Activity   Alcohol use: Yes    Comment: once in a while   Drug use: Not Currently   Sexual activity: Not on file  Other Topics Concern   Not on file  Social History Narrative   Not on file   Social Determinants of Health   Financial Resource Strain: Not on file  Food Insecurity: Not on file  Transportation Needs: Not on file  Physical Activity: Not on file  Stress: Not on file  Social Connections: Not on file     Family History: The patient's family history includes Breast cancer in his mother; Dementia in his maternal grandmother and mother; Hypertension in his father and mother; Multiple sclerosis in his son; Prostate cancer in his maternal grandfather. ROS:   Please see the history of present illness.    All 14 point review of systems  negative except as described per history of present illness  EKGs/Labs/Other Studies Reviewed:      Recent Labs: No results found for requested labs within last 8760 hours.  Recent Lipid Panel    Component Value Date/Time   CHOL 121 03/06/2020 0923   TRIG 91 03/06/2020 0923   HDL 51 03/06/2020 0923   CHOLHDL 2.4 03/06/2020 0923   LDLCALC 53 03/06/2020 0923    Physical Exam:    VS:  There were no vitals taken for this visit.    Wt Readings from Last 3 Encounters:  08/24/20 218 lb (98.9 kg)  03/23/20 221 lb 9.6 oz (100.5 kg)  12/02/19 222 lb 9.6 oz (101 kg)     GEN:  Well nourished, well developed in no acute distress HEENT: Normal NECK: No JVD; No carotid bruits LYMPHATICS: No lymphadenopathy CARDIAC: RRR, no murmurs, no rubs, no  gallops RESPIRATORY:  Clear to auscultation without rales, wheezing or rhonchi  ABDOMEN: Soft, non-tender, non-distended MUSCULOSKELETAL:  No edema; No deformity  SKIN: Warm and dry LOWER EXTREMITIES: no swelling NEUROLOGIC:  Alert and oriented x 3 PSYCHIATRIC:  Normal affect   ASSESSMENT:    1. Paroxysmal atrial fibrillation (HCC)   2. Coronary artery disease of native artery of native heart with stable angina pectoris (HCC)   3. DOE (dyspnea on exertion)    PLAN:    In order of problems listed above:  Chest pain with some concerning characteristics he did have a cardiac catheterization in May 2021 which showed only 45% proximal LAD stress test on the hospitalist normal is hard for me to imagine that stress test would miss LAD ischemia however with his symptomatology I am worried about coronary disease.  We discussed option being cardiac catheterization versus coronary CT angio.  He prefer less invasive way to look in his coronary arteries therefore I will schedule him to have coronary CT angio if pain become worse or if insurance company refused for coronary CT angio then we will go with cardiac catheterization.  In the meantime I will  give him prescription for nitroglycerin, also will give him ranolazine 500 mg twice daily Dyspnea and exertion plan as described above Paroxysmal atrial fibrillation maintaining sinus rhythm anticoagulated I did review medical record from hospital for this visit   Medication Adjustments/Labs and Tests Ordered: Current medicines are reviewed at length with the patient today.  Concerns regarding medicines are outlined above.  No orders of the defined types were placed in this encounter.  Medication changes: No orders of the defined types were placed in this encounter.   Signed, Georgeanna Lea, MD, [facility] 02/01/2021 1:11 PM    [facility]

## 2021-02-04 ENCOUNTER — Other Ambulatory Visit: Payer: Self-pay | Admitting: *Deleted

## 2021-02-04 DIAGNOSIS — I25118 Atherosclerotic heart disease of native coronary artery with other forms of angina pectoris: Secondary | ICD-10-CM

## 2021-02-04 DIAGNOSIS — R0600 Dyspnea, unspecified: Secondary | ICD-10-CM

## 2021-02-04 DIAGNOSIS — R0609 Other forms of dyspnea: Secondary | ICD-10-CM

## 2021-02-04 LAB — BASIC METABOLIC PANEL
BUN/Creatinine Ratio: 15 (ref 10–24)
BUN: 17 mg/dL (ref 8–27)
CO2: 25 mmol/L (ref 20–29)
Calcium: 9.2 mg/dL (ref 8.6–10.2)
Chloride: 105 mmol/L (ref 96–106)
Creatinine, Ser: 1.11 mg/dL (ref 0.76–1.27)
Glucose: 91 mg/dL (ref 65–99)
Potassium: 4.3 mmol/L (ref 3.5–5.2)
Sodium: 142 mmol/L (ref 134–144)
eGFR: 73 mL/min/{1.73_m2} (ref >59)

## 2021-02-12 ENCOUNTER — Telehealth (HOSPITAL_COMMUNITY): Payer: Self-pay | Admitting: *Deleted

## 2021-02-12 NOTE — Telephone Encounter (Signed)
Patient's wife returning call regarding upcoming cardiac imaging study; pt verbalizes understanding of appt date/time, parking situation and where to check in, pre-test NPO status and medications ordered, and verified current allergies; name and call back number provided for further questions should they arise  Larey Brick RN Navigator Cardiac Imaging [facility] [phone] office [phone] cell  Patient to take daily medications for test.

## 2021-02-12 NOTE — Telephone Encounter (Signed)
Attempted to call patient regarding upcoming cardiac CT appointment. °Left message on voicemail with name and callback number ° °Kyisha Fowle RN Navigator Cardiac Imaging °[facility] °[phone] Office °[phone] Cell ° °

## 2021-02-15 ENCOUNTER — Inpatient Hospital Stay (HOSPITAL_COMMUNITY)
Admission: RE | Admit: 2021-02-15 | Discharge: 2021-02-15 | Disposition: A | Discharge status: Home / Self Care | Payer: Medicare HMO | Source: Ambulatory Visit | Attending: Cardiology | Admitting: Cardiology

## 2021-02-15 ENCOUNTER — Other Ambulatory Visit: Payer: Self-pay

## 2021-02-15 ENCOUNTER — Other Ambulatory Visit (HOSPITAL_COMMUNITY): Payer: Self-pay | Admitting: Emergency Medicine

## 2021-02-15 ENCOUNTER — Emergency Department (HOSPITAL_COMMUNITY): Payer: Medicare HMO

## 2021-02-15 ENCOUNTER — Encounter (HOSPITAL_COMMUNITY): Payer: Self-pay

## 2021-02-15 ENCOUNTER — Ambulatory Visit (HOSPITAL_COMMUNITY)
Admission: RE | Admit: 2021-02-15 | Discharge: 2021-02-15 | Disposition: A | Discharge status: Home / Self Care | Payer: Medicare HMO | Source: Ambulatory Visit | Attending: Cardiology | Admitting: Cardiology

## 2021-02-15 ENCOUNTER — Inpatient Hospital Stay (HOSPITAL_COMMUNITY)
Admission: EM | Admit: 2021-02-15 | Discharge: 2021-02-17 | DRG: 247 | Disposition: A | Discharge status: Home / Self Care | Payer: Medicare HMO | Source: Ambulatory Visit | Attending: Cardiovascular Disease | Admitting: Cardiovascular Disease

## 2021-02-15 ENCOUNTER — Telehealth: Payer: Self-pay | Admitting: Cardiology

## 2021-02-15 DIAGNOSIS — I428 Other cardiomyopathies: Secondary | ICD-10-CM | POA: Diagnosis present

## 2021-02-15 DIAGNOSIS — R079 Chest pain, unspecified: Secondary | ICD-10-CM | POA: Insufficient documentation

## 2021-02-15 DIAGNOSIS — R0600 Dyspnea, unspecified: Secondary | ICD-10-CM | POA: Insufficient documentation

## 2021-02-15 DIAGNOSIS — I25118 Atherosclerotic heart disease of native coronary artery with other forms of angina pectoris: Secondary | ICD-10-CM | POA: Insufficient documentation

## 2021-02-15 DIAGNOSIS — I25110 Atherosclerotic heart disease of native coronary artery with unstable angina pectoris: Principal | ICD-10-CM | POA: Diagnosis present

## 2021-02-15 DIAGNOSIS — Z7982 Long term (current) use of aspirin: Secondary | ICD-10-CM

## 2021-02-15 DIAGNOSIS — Z7901 Long term (current) use of anticoagulants: Secondary | ICD-10-CM

## 2021-02-15 DIAGNOSIS — Z23 Encounter for immunization: Secondary | ICD-10-CM

## 2021-02-15 DIAGNOSIS — Z8249 Family history of ischemic heart disease and other diseases of the circulatory system: Secondary | ICD-10-CM

## 2021-02-15 DIAGNOSIS — R931 Abnormal findings on diagnostic imaging of heart and coronary circulation: Secondary | ICD-10-CM

## 2021-02-15 DIAGNOSIS — Z8042 Family history of malignant neoplasm of prostate: Secondary | ICD-10-CM

## 2021-02-15 DIAGNOSIS — I2510 Atherosclerotic heart disease of native coronary artery without angina pectoris: Secondary | ICD-10-CM | POA: Diagnosis not present

## 2021-02-15 DIAGNOSIS — I4821 Permanent atrial fibrillation: Secondary | ICD-10-CM | POA: Diagnosis present

## 2021-02-15 DIAGNOSIS — I200 Unstable angina: Secondary | ICD-10-CM | POA: Diagnosis not present

## 2021-02-15 DIAGNOSIS — Z20822 Contact with and (suspected) exposure to covid-19: Secondary | ICD-10-CM | POA: Diagnosis present

## 2021-02-15 DIAGNOSIS — E782 Mixed hyperlipidemia: Secondary | ICD-10-CM | POA: Diagnosis present

## 2021-02-15 DIAGNOSIS — Z803 Family history of malignant neoplasm of breast: Secondary | ICD-10-CM

## 2021-02-15 DIAGNOSIS — R7303 Prediabetes: Secondary | ICD-10-CM | POA: Diagnosis present

## 2021-02-15 DIAGNOSIS — Z9861 Coronary angioplasty status: Secondary | ICD-10-CM

## 2021-02-15 DIAGNOSIS — Z7902 Long term (current) use of antithrombotics/antiplatelets: Secondary | ICD-10-CM

## 2021-02-15 DIAGNOSIS — I480 Paroxysmal atrial fibrillation: Secondary | ICD-10-CM | POA: Diagnosis present

## 2021-02-15 DIAGNOSIS — E785 Hyperlipidemia, unspecified: Secondary | ICD-10-CM | POA: Diagnosis present

## 2021-02-15 DIAGNOSIS — I10 Essential (primary) hypertension: Secondary | ICD-10-CM | POA: Diagnosis present

## 2021-02-15 DIAGNOSIS — F329 Major depressive disorder, single episode, unspecified: Secondary | ICD-10-CM | POA: Diagnosis present

## 2021-02-15 DIAGNOSIS — E538 Deficiency of other specified B group vitamins: Secondary | ICD-10-CM | POA: Diagnosis present

## 2021-02-15 DIAGNOSIS — Z955 Presence of coronary angioplasty implant and graft: Secondary | ICD-10-CM

## 2021-02-15 DIAGNOSIS — G4733 Obstructive sleep apnea (adult) (pediatric): Secondary | ICD-10-CM | POA: Diagnosis present

## 2021-02-15 DIAGNOSIS — G894 Chronic pain syndrome: Secondary | ICD-10-CM | POA: Diagnosis present

## 2021-02-15 DIAGNOSIS — Z820 Family history of epilepsy and other diseases of the nervous system: Secondary | ICD-10-CM

## 2021-02-15 HISTORY — DX: Chest pain, unspecified: R07.9

## 2021-02-15 LAB — CBC
HCT: 48.9 % (ref 39.0–52.0)
Hemoglobin: 16.1 g/dL (ref 13.0–17.0)
MCH: 30.7 pg (ref 26.0–34.0)
MCHC: 32.9 g/dL (ref 30.0–36.0)
MCV: 93.1 fL (ref 80.0–100.0)
Platelets: 230 10*3/uL (ref 150–400)
RBC: 5.25 MIL/uL (ref 4.22–5.81)
RDW: 13.5 % (ref 11.5–15.5)
WBC: 6.8 10*3/uL (ref 4.0–10.5)
nRBC: 0 % (ref 0.0–0.2)

## 2021-02-15 LAB — BASIC METABOLIC PANEL
Anion gap: 7 (ref 5–15)
BUN: 17 mg/dL (ref 8–23)
CO2: 25 mmol/L (ref 22–32)
Calcium: 9 mg/dL (ref 8.9–10.3)
Chloride: 104 mmol/L (ref 98–111)
Creatinine, Ser: 1.19 mg/dL (ref 0.61–1.24)
GFR, Estimated: >60 mL/min (ref >60)
Glucose, Bld: 88 mg/dL (ref 70–99)
Potassium: 4 mmol/L (ref 3.5–5.1)
Sodium: 136 mmol/L (ref 135–145)

## 2021-02-15 LAB — RESP PANEL BY RT-PCR (FLU A&B, COVID) ARPGX2
Influenza A by PCR: NEGATIVE
Influenza B by PCR: NEGATIVE
SARS Coronavirus 2 by RT PCR: NEGATIVE

## 2021-02-15 LAB — TROPONIN I (HIGH SENSITIVITY)
Troponin I (High Sensitivity): 4 ng/L (ref <18)
Troponin I (High Sensitivity): 3 ng/L (ref <18)

## 2021-02-15 IMAGING — CT CT HEART MORP W/ CTA COR W/ SCORE W/ CA W/CM &/OR W/O CM
1 of 10 series · 3 of 20 positions shown, 4 images · IV contrast (APPLIED)
Comparison: 01/21/2021
COMPARISON: 01/21/2021

Addendum:
EXAM:
OVER-READ INTERPRETATION  CT CHEST

The following report is an over-read performed by radiologist Dr.
Markenzi Gantz [REDACTED] on 02/15/2021. This over-read
does not include interpretation of cardiac or coronary anatomy or
pathology. The coronary CTA interpretation by the cardiologist is
attached.
CLINICAL DATA: CP with normal stress test
Cardiac/Coronary  CTA
TECHNIQUE: The patient was scanned on a [employer] Force scanner.

[Series 14: best syst · axial · 0.39mm/px · z∈[+19,+148]mm · 3 of 322 slices shown, 4 images]
[im 1/322  vessel]
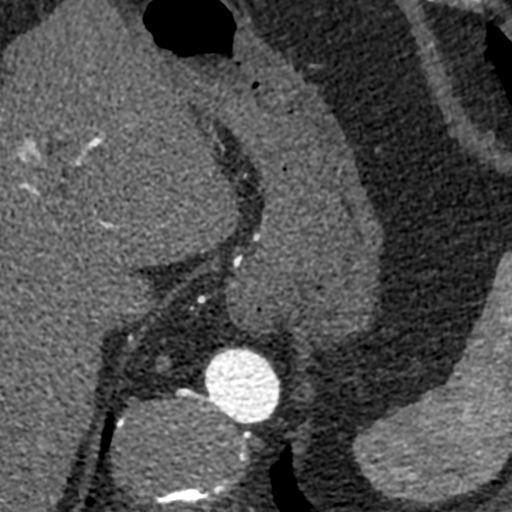
[im 1/322  lung]
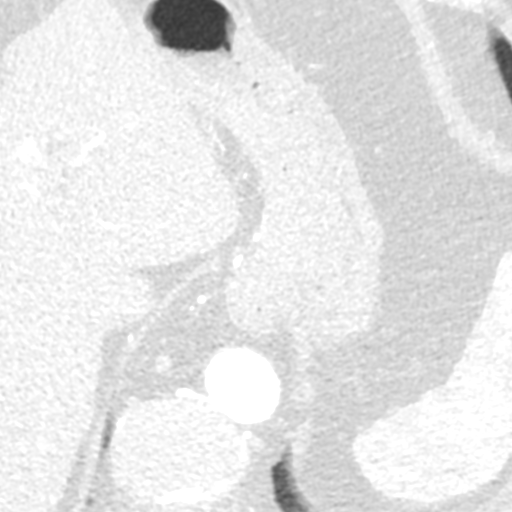
[im 161/322  vessel]
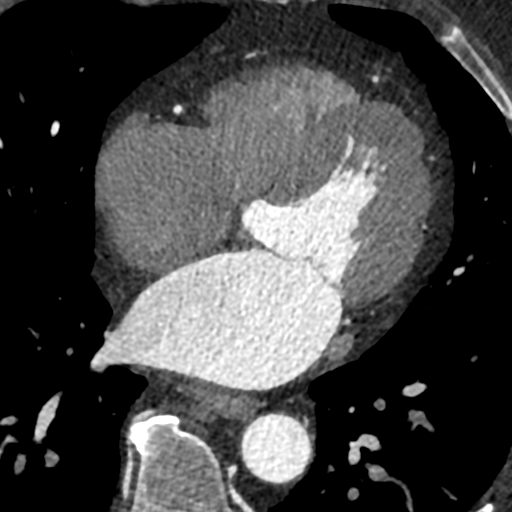
[im 322/322  vessel]
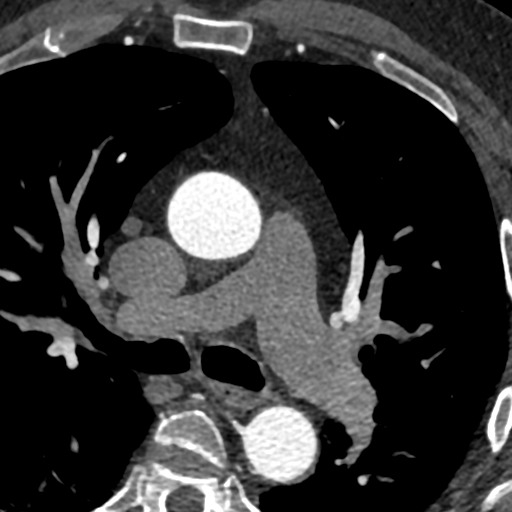

[3 of 20 positions shown; findings below may reference images not displayed]

FINDINGS: Vascular: Heart is normal size.  Aorta normal caliber.

Mediastinum/Nodes: No adenopathy. Small hiatal hernia again noted,
unchanged.

Lungs/Pleura: No confluent opacities or effusions.

Upper Abdomen: Imaging into the upper abdomen demonstrates no acute
findings.

Musculoskeletal: Chest wall soft tissues are unremarkable. No acute
bony abnormality.
IMPRESSION: No acute extra cardiac abnormality.
FINDINGS: A 120 kV prospective scan was triggered in the descending thoracic
aorta at 111 HU's. Axial non-contrast 3 mm slices were carried out
through the heart. The data set was analyzed on a dedicated work
station and scored using the Agatson method. Gantry rotation speed
was 250 msecs and collimation was .6 mm. No beta blockade and 0.8 mg
of sl NTG was given. The 3D data set was reconstructed in 5%
intervals of the 67-82 % of the R-R cycle. Diastolic phases were
analyzed on a dedicated work station using MPR, MIP and VRT modes.
The patient received 80 cc of contrast.

Aorta:  Normal size.  No calcifications.  No dissection.

Aortic Valve:  Trileaflet.  No calcifications.

Coronary Arteries:  Normal coronary origin.  Right dominance.

RCA is a large dominant artery that gives rise to PDA and PLA. In
the mid portion of the artery calcified, minimal, non obstructive
(0-25%) plaque is noted.

Left main is a large artery that gives rise to LAD and LCX arteries.
In the distal portion of this artery small, calcified, non
obstructive (0-25%) plaque is noted.

LAD is a large vessel. In the proximal portion of this artery
complex plaque is present with intraplaque dye penetration. This
plaque cause severe stenosis (70-79%). Small D1 is noted distally.

LCX is a non-dominant artery that trifurcates to moderate size OM1,
moderate size OM[REDACTED]. OM2 has minimal non obstructive (0-25%)
calcified plaque.

Other findings:

Normal pulmonary vein drainage into the left atrium. Right mid
pulmonary vein noted - normal variant.

Normal left atrial appendage without a thrombus.

Normal size of the pulmonary artery.
IMPRESSION: 1. Coronary calcium score of 92.

2. Normal coronary origin with right dominance.

3. CAD-RADS 4 Severe stenosis. (70-99% or > 50% left main). Cardiac
catheterization or CT FFR is recommended. Consider symptom-guided
anti-ischemic pharmacotherapy as well as risk factor modification
per guideline directed care.

*** End of Addendum ***
EXAM:
OVER-READ INTERPRETATION  CT CHEST

The following report is an over-read performed by radiologist Dr.
VAUGHN [REDACTED] on 02/15/2021. This over-read
does not include interpretation of cardiac or coronary anatomy or
pathology. The coronary CTA interpretation by the cardiologist is
attached.
FINDINGS: Vascular: Heart is normal size.  Aorta normal caliber.

Mediastinum/Nodes: No adenopathy. Small hiatal hernia again noted,
unchanged.

Lungs/Pleura: No confluent opacities or effusions.

Upper Abdomen: Imaging into the upper abdomen demonstrates no acute
findings.

Musculoskeletal: Chest wall soft tissues are unremarkable. No acute
bony abnormality.
IMPRESSION: No acute extra cardiac abnormality.

## 2021-02-15 MED ORDER — ASPIRIN 81 MG PO CHEW
324.0000 mg | CHEWABLE_TABLET | ORAL | Status: AC
  Administered 2021-02-15: 324 mg via ORAL
  Filled 2021-02-15: qty 4

## 2021-02-15 MED ORDER — NITROGLYCERIN 0.4 MG SL SUBL
SUBLINGUAL_TABLET | SUBLINGUAL | Status: AC
  Filled 2021-02-15: qty 2

## 2021-02-15 MED ORDER — NITROGLYCERIN 0.4 MG SL SUBL
0.4000 mg | SUBLINGUAL_TABLET | SUBLINGUAL | Status: DC | PRN
  Administered 2021-02-16: 0.4 mg via SUBLINGUAL
  Filled 2021-02-15: qty 1

## 2021-02-15 MED ORDER — IOHEXOL 350 MG/ML SOLN
95.0000 mL | Freq: Once | INTRAVENOUS | Status: AC | PRN
  Administered 2021-02-15: 95 mL via INTRAVENOUS

## 2021-02-15 MED ORDER — ROSUVASTATIN CALCIUM 20 MG PO TABS
20.0000 mg | ORAL_TABLET | Freq: Every day | ORAL | Status: DC
  Administered 2021-02-16 – 2021-02-17 (×2): 20 mg via ORAL
  Filled 2021-02-15 (×2): qty 1

## 2021-02-15 MED ORDER — ASPIRIN EC 81 MG PO TBEC
81.0000 mg | DELAYED_RELEASE_TABLET | Freq: Every day | ORAL | Status: DC
  Administered 2021-02-16 – 2021-02-17 (×2): 81 mg via ORAL
  Filled 2021-02-15 (×2): qty 1

## 2021-02-15 MED ORDER — DILTIAZEM HCL ER COATED BEADS 120 MG PO CP24
120.0000 mg | ORAL_CAPSULE | Freq: Every day | ORAL | Status: DC
  Administered 2021-02-16 – 2021-02-17 (×2): 120 mg via ORAL
  Filled 2021-02-15 (×2): qty 1

## 2021-02-15 MED ORDER — ONDANSETRON HCL 4 MG/2ML IJ SOLN: 4.0000 mg | Freq: Four times a day (QID) | INTRAMUSCULAR | Status: DC | PRN

## 2021-02-15 MED ORDER — SODIUM CHLORIDE 0.9 % IV SOLN: 250.0000 mL | INTRAVENOUS | Status: DC | PRN

## 2021-02-15 MED ORDER — PAROXETINE HCL 20 MG PO TABS
40.0000 mg | ORAL_TABLET | Freq: Every day | ORAL | Status: DC
  Administered 2021-02-16 – 2021-02-17 (×2): 40 mg via ORAL
  Filled 2021-02-15 (×2): qty 2

## 2021-02-15 MED ORDER — FLECAINIDE ACETATE 50 MG PO TABS
50.0000 mg | ORAL_TABLET | Freq: Two times a day (BID) | ORAL | Status: DC
  Administered 2021-02-15 – 2021-02-17 (×4): 50 mg via ORAL
  Filled 2021-02-15 (×5): qty 1

## 2021-02-15 MED ORDER — CARVEDILOL 6.25 MG PO TABS
6.2500 mg | ORAL_TABLET | Freq: Two times a day (BID) | ORAL | Status: DC
  Administered 2021-02-15 – 2021-02-17 (×4): 6.25 mg via ORAL
  Filled 2021-02-15: qty 1
  Filled 2021-02-15: qty 2
  Filled 2021-02-15 (×2): qty 1

## 2021-02-15 MED ORDER — NITROGLYCERIN 0.4 MG SL SUBL
0.8000 mg | SUBLINGUAL_TABLET | Freq: Once | SUBLINGUAL | Status: AC
  Administered 2021-02-15: 0.8 mg via SUBLINGUAL

## 2021-02-15 MED ORDER — ASPIRIN 300 MG RE SUPP: 300.0000 mg | RECTAL | Status: AC

## 2021-02-15 MED ORDER — ROSUVASTATIN CALCIUM 20 MG PO TABS: 20.0000 mg | ORAL_TABLET | Freq: Every day | ORAL | Status: DC

## 2021-02-15 MED ORDER — PREGABALIN 75 MG PO CAPS
75.0000 mg | ORAL_CAPSULE | Freq: Two times a day (BID) | ORAL | Status: DC
  Administered 2021-02-15 – 2021-02-17 (×4): 75 mg via ORAL
  Filled 2021-02-15 (×3): qty 1
  Filled 2021-02-15: qty 3

## 2021-02-15 MED ORDER — HEPARIN (PORCINE) 25000 UT/250ML-% IV SOLN
1350.0000 [IU]/h | INTRAVENOUS | Status: DC
  Administered 2021-02-15: 1350 [IU]/h via INTRAVENOUS
  Filled 2021-02-15: qty 250

## 2021-02-15 MED ORDER — ACETAMINOPHEN 325 MG PO TABS: 650.0000 mg | ORAL_TABLET | ORAL | Status: DC | PRN

## 2021-02-15 MED ORDER — ASPIRIN 81 MG PO CHEW
81.0000 mg | CHEWABLE_TABLET | ORAL | Status: AC
  Administered 2021-02-16: 81 mg via ORAL
  Filled 2021-02-15: qty 1

## 2021-02-15 NOTE — ED Notes (Signed)
Consent at bedside.  

## 2021-02-15 NOTE — Telephone Encounter (Signed)
I have read coronary CT angio to [facility] just few minutes ago.  He does have very complex lesion involving proximal portion of left anterior descending artery.  Even worst there was a penetration of the dye inside the lesion.  That lesion resulted in what appears to be severe stenosis of the proximal LAD.  I did do fractional flow reserve and fractional flow reserve distally from the lesion is 0.59 which indicate that lesion is severely stenotic.  I spoke to his wife over the phone right now and she tells me that Richard Spence does not feel well he start, exhausted does not have typical chest pain but he presented to [facility] about few weeks ago with fairly typical chest pain.  I advised him to go to the emergency room he need to be admitted and then he need to have a cardiac catheterization as quickly as visible.  He is on anticoagulation and that medication will need to be withdrawn.  He probably need to be put on dual antiplatelet therapy with anticoagulation after that.

## 2021-02-15 NOTE — ED Provider Notes (Signed)
[facility] EMERGENCY DEPARTMENT Provider Note   CSN: [id number] Arrival date & time: 02/15/21  1422     History Chief Complaint  Patient presents with   Chest Pain    Richard Spence is a 65 y.o. male with PMHx CAD, HTN, HLD, OSA, CM, Atrial Fibrillation on Eliquis who presents for evaluation of chest pain.   Over the past several weeks, the patient has experienced recurrent episodes of exertional chest pain and dyspnea.  After discussion with Cardiology, he underwent coronary CTA today, which was notable for significant lesion in pLAD with FFR of 0.64. He was called by his Cardiologist and instructed to present to the ED for admission with plans for cardiac cath. He denies chest pain or shortness of breath at this time.   Past Medical History:  Diagnosis Date   Abnormal stress test 10/21/2019   Acute pain of left knee 04/14/2020   Altered mental status 10/25/2018   Atrial fibrillation with rapid ventricular response (HCC) 10/08/2018   Atrial fibrillation with rapid ventricular response (HCC) 10/08/2018   Cardiomyopathy (HCC) 10/25/2018   EF 25-30% on ST. 40% on ECHO 2020.  LVH   Chronic pain syndrome 04/27/2020   Coronary artery disease of native artery of native heart with stable angina pectoris (HCC) 12/02/2019   Formatting of this note might be different from the original. 10/2019. Cath at [facility] LAD 45%. EF 55-60%   Degeneration of lumbar intervertebral disc 07/30/2015   DOE (dyspnea on exertion) 10/16/2018   Facet arthropathy, cervical 01/30/2020   Formatting of this note might be different from the original. Added automatically from request for surgery [id number]  Formatting of this note might be different from the original. Added automatically from request for surgery [id number]   Fatigue 06/06/2019   Hearing loss 10/15/2018   High risk medication use 07/30/2015   Long-term use of aspirin therapy 10/15/2018   Malaise and fatigue 10/25/2018   Mild cognitive impairment  12/10/2018   Formatting of this note might be different from the original. 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 Added automatically from request for surgery [id number]   Paroxysmal atrial fibrillation (HCC) 05/06/2019   Permanent atrial fibrillation (HCC) 10/08/2018   Persistent atrial fibrillation, status post cardioversion in July 2020 about flip back to atrial fibrillation a few weeks later 01/21/2019   Postlaminectomy syndrome of lumbar region 04/27/2020   Postural dizziness with presyncope 10/25/2018   Prediabetes 07/30/2015   Right shoulder injury 10/08/2018   S/P arthroscopy of right shoulder 03/12/2019   S/P rotator cuff repair 03/12/2019   SI (sacroiliac) pain 07/27/2020   Tear of right supraspinatus tendon 10/25/2018   Tremor 07/30/2015   Vitamin B 12 deficiency 07/30/2015   White matter disease, unspecified 11/19/2018   Noted on MRI of brain 2020. Discussed with pt and wife.    Patient Active Problem List   Diagnosis Date Noted   Chest pain 02/15/2021   SI (sacroiliac) pain 07/27/2020   Chronic pain syndrome 04/27/2020   Postlaminectomy syndrome of lumbar region 04/27/2020   Acute pain of left knee 04/14/2020  Facet arthropathy, cervical 01/30/2020   Neck pain 01/30/2020   Pain of cervical facet joint 01/30/2020   Coronary artery disease of native artery of native heart with stable angina pectoris (HCC) 12/02/2019   Abnormal stress test 10/21/2019   Fatigue 06/06/2019   Paroxysmal atrial fibrillation (HCC) 05/06/2019   S/P arthroscopy of right shoulder 03/12/2019   S/P rotator cuff repair 03/12/2019    Persistent atrial fibrillation, status post cardioversion in July 2020 about flip back to atrial fibrillation a few weeks later 01/21/2019   Mild cognitive impairment 12/10/2018   White matter disease, unspecified 11/19/2018   Altered mental status 10/25/2018   Cardiomyopathy (HCC) 10/25/2018   Malaise and fatigue 10/25/2018   Postural dizziness with presyncope 10/25/2018   Tear of right supraspinatus tendon 10/25/2018   DOE (dyspnea on exertion) 10/16/2018   Hearing loss 10/15/2018   Long-term use of aspirin therapy 10/15/2018   Permanent atrial fibrillation (HCC) 10/08/2018   Right shoulder injury 10/08/2018   Atrial fibrillation with rapid ventricular response (HCC) 10/08/2018   Degeneration of lumbar intervertebral disc 07/30/2015   High risk medication use 07/30/2015   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   Mixed hyperlipidemia 07/30/2015   Prediabetes 07/30/2015   Tremor 07/30/2015   Vitamin B 12 deficiency 07/30/2015    Past Surgical History:  Procedure Laterality Date   BACK SURGERY     CHOLECYSTECTOMY     HERNIA REPAIR  2009   LEFT HEART CATH AND CORONARY ANGIOGRAPHY N/A 10/25/2019   Procedure: LEFT HEART CATH AND CORONARY ANGIOGRAPHY;  Surgeon: Swazil[place], Peter M, MD;  Location: [facility] INVASIVE CV LAB;  Service: Cardiovascular;  Laterality: N/A;   ROTATOR CUFF REPAIR  03/07/2019   SHOULDER SURGERY Left 12/2010   Rotater cuff   SPINAL FUSION  1998   Dr Nadene Rubins   [facility] SURGERY  2001   Spinal hardware removal by Dr. Lilian Kapur       Family History  Problem Relation Age of Onset   Dementia Mother    Breast cancer Mother    Hypertension Mother    Hypertension Father    Dementia Maternal Grandmother    Prostate cancer Maternal Grandfather    Multiple sclerosis Son     Social History   Tobacco Use   Smoking status: Never   Smokeless tobacco: Never  Vaping Use   Vaping Use: Never used  Substance Use Topics   Alcohol use: Yes     Comment: once in a while   Drug use: Not Currently    Home Medications Prior to Admission medications   Medication Sig Start Date End Date Taking? Authorizing Provider  apixaban (ELIQUIS) 5 MG TABS tablet Take 1 tablet (5 mg total) by mouth 2 (two) times daily. 10/26/19  Yes [place], Peter M, MD  carvedilol (COREG) 6.25 MG tablet Take 1 tablet (6.25 mg total) by mouth 2 (two) times daily. 11/03/20  Yes Georgeanna Lea, MD  Cholecalciferol (VITAMIN D3) 125 MCG (5000 UT) CAPS Take 5,000 Units by mouth daily.    Yes [provider]  diltiazem (CARDIZEM CD) 120 MG 24 hr capsule Take 120 mg by mouth daily.  11/01/18 02/15/21 Yes [provider]  flecainide (TAMBOCOR) 50 MG tablet TAKE 1 TABLET BY MOUTH TWICE (2) DAILY Patient taking differently: Take 50 mg by mouth 2 (two) times daily. TAKE 1 TABLET BY MOUTH TWICE (2) DAILY 09/14/20  Yes Georgeanna Lea, MD  nitroGLYCERIN (NITROSTAT) 0.4 MG SL tablet Place 1 tablet (  0.4 mg total) under the tongue every 5 (five) minutes as needed. 02/01/21 05/02/21 Yes Georgeanna Lea, MD  PARoxetine (PAXIL) 40 MG tablet Take 40 mg by mouth daily.  10/10/18  Yes [provider]  pregabalin (LYRICA) 75 MG capsule Take 1 capsule by mouth 2 (two) times daily. 08/17/20  Yes [provider]  ranolazine (RANEXA) 500 MG 12 hr tablet Take 1 tablet (500 mg total) by mouth 2 (two) times daily. 02/01/21  Yes Georgeanna Lea, MD  rosuvastatin (CRESTOR) 20 MG tablet TAKE ONE TABLET BY MOUTH EVERY DAY Patient taking differently: Take 20 mg by mouth daily. 10/13/20  Yes Georgeanna Lea, MD  vitamin B-12 (CYANOCOBALAMIN) 1000 MCG tablet Take 1,000 mcg by mouth daily.   Yes [provider]    Allergies    Patient has no known allergies.  Review of Systems   Review of Systems  Constitutional:  Negative for chills and fever.  HENT:  Negative for ear pain and sore throat.   Eyes:  Negative for pain and visual disturbance.   Respiratory:  Positive for shortness of breath. Negative for cough.   Cardiovascular:  Positive for chest pain. Negative for palpitations.  Gastrointestinal:  Negative for abdominal pain and vomiting.  Genitourinary:  Negative for dysuria and hematuria.  Musculoskeletal:  Negative for arthralgias and back pain.  Skin:  Negative for color change and rash.  Neurological:  Negative for seizures and syncope.  All other systems reviewed and are negative.  Physical Exam Updated Vital Signs BP 122/69 (BP Location: Right Arm)   Pulse (!) 49   Temp 98.4 F (36.9 C) (Oral)   Resp 20   Ht 6' (1.829 m)   Wt 104 kg   SpO2 94%   BMI 31.09 kg/m   Physical Exam Vitals and nursing note reviewed.  Constitutional:      Appearance: He is well-developed.  HENT:     Head: Normocephalic and atraumatic.  Eyes:     Conjunctiva/sclera: Conjunctivae normal.  Cardiovascular:     Rate and Rhythm: Normal rate and regular rhythm.     Heart sounds: No murmur heard. Pulmonary:     Effort: Pulmonary effort is normal. No respiratory distress.     Breath sounds: Normal breath sounds.  Abdominal:     Palpations: Abdomen is soft.     Tenderness: There is no abdominal tenderness.  Musculoskeletal:     Cervical back: Neck supple.  Skin:    General: Skin is warm and dry.  Neurological:     Mental Status: He is alert.    ED Results / Procedures / Treatments   Labs (all labs ordered are listed, but only abnormal results are displayed) Labs Reviewed  RESP PANEL BY RT-PCR (FLU A&B, COVID) ARPGX2  BASIC METABOLIC PANEL  CBC  HEPARIN LEVEL (UNFRACTIONATED)  APTT  HIV ANTIBODY (ROUTINE TESTING W REFLEX)  BASIC METABOLIC PANEL  LIPID PANEL  TROPONIN I (HIGH SENSITIVITY)  TROPONIN I (HIGH SENSITIVITY)    EKG EKG Interpretation  Date/Time:  Monday February 15 2021 14:48:19 EDT Ventricular Rate:  52 PR Interval:  170 QRS Duration: 90 QT Interval:  428 QTC Calculation: 398 R Axis:   -13 Text  Interpretation: Sinus bradycardia Otherwise normal ECG Confirmed by Blane Ohara [phone]) on 02/15/2021 5:27:23 PM  Radiology DG Chest 2 View  Result Date: 02/15/2021 CLINICAL DATA:  Chest pain. EXAM: CHEST - 2 VIEW COMPARISON:  Chest x-ray 10/21/2019. CT chest 01/21/2021. FINDINGS: Cardiomediastinal silhouette is within  normal limits. Moderate size hiatal hernia is unchanged from the prior study. There is no focal lung infiltrate, pleural effusion or pneumothorax. Degenerative changes affect the spine. IMPRESSION: No active cardiopulmonary disease. Electronically Signed   By: Darliss Cheney M.D.   On: 02/15/2021 16:33      Procedures Procedures   Medications Ordered in ED Medications  carvedilol (COREG) tablet 6.25 mg (6.25 mg Oral Given 02/15/21 2217)  diltiazem (CARDIZEM CD) 24 hr capsule 120 mg (has no administration in time range)  flecainide (TAMBOCOR) tablet 50 mg (50 mg Oral Given 02/15/21 2216)  PARoxetine (PAXIL) tablet 40 mg (has no administration in time range)  pregabalin (LYRICA) capsule 75 mg (75 mg Oral Given 02/15/21 2217)  aspirin EC tablet 81 mg (has no administration in time range)  nitroGLYCERIN (NITROSTAT) SL tablet 0.4 mg (0.4 mg Sublingual Given 02/16/21 0113)  acetaminophen (TYLENOL) tablet 650 mg (has no administration in time range)  ondansetron (ZOFRAN) injection 4 mg (has no administration in time range)  sodium chloride flush (NS) 0.9 % injection 3 mL (3 mLs Intravenous Not Given 02/16/21 0140)  sodium chloride flush (NS) 0.9 % injection 3 mL (has no administration in time range)  0.9 %  sodium chloride infusion (has no administration in time range)  aspirin chewable tablet 81 mg (has no administration in time range)  0.9% sodium chloride infusion (has no administration in time range)    Followed by  0.9% sodium chloride infusion (has no administration in time range)  heparin ADULT infusion 100 units/mL (25000 units/257mL) (1,350 Units/hr Intravenous New Bag/Given  02/15/21 1832)  rosuvastatin (CRESTOR) tablet 20 mg (has no administration in time range)  aspirin chewable tablet 324 mg (324 mg Oral Given 02/15/21 2216)    Or  aspirin suppository 300 mg ( Rectal See Alternative 02/15/21 2216)    ED Course  I have reviewed the triage vital signs and the nursing notes.  Pertinent labs & imaging results that were available during my care of the patient were reviewed by me and considered in my medical decision making (see chart for details).    MDM Rules/Calculators/A&P HEAR Score: 61                         65 y.o. male with past medical history as above who presents for evaluation of chest pain. Afebrile and hemodynamically stable.  Exam as detailed above. Labs vastly unremarkable, stable kidney function. Normal troponins. EKG with sinus bradycardia without evidence of ischemia. CXR without acute cardiopulmonary abnormality. Cardiology was consulted, who will plan for admission and cardiac cath.   Final Clinical Impression(s) / ED Diagnoses Final diagnoses:  Status post coronary artery stent placement  S/P PTCA (percutaneous transluminal coronary angioplasty)    Rx / DC Orders ED Discharge Orders     None        Holley Dexter, MD 02/17/21 1155    Blane Ohara, MD 02/18/21 1207

## 2021-02-15 NOTE — Progress Notes (Addendum)
ANTICOAGULATION CONSULT NOTE - Initial Consult  Pharmacy Consult for heparin Indication: chest pain/ACS  No Known Allergies  Patient Measurements: Height: 6' (182.9 cm) Weight: 104.3 kg (230 lb) IBW/kg (Calculated) : 77.6 Heparin Dosing Weight: 99 kg   Vital Signs: Temp: 98.3 F (36.8 C) (09/12 1458) Temp Source: Oral (09/12 1458) BP: 124/79 (09/12 1458) Pulse Rate: 52 (09/12 1458)  Labs: Recent Labs    02/15/21 1516  HGB 16.1  HCT 48.9  PLT 230  CREATININE 1.19  TROPONINIHS 4    Estimated Creatinine Clearance: 76.3 mL/min (by C-G formula based on SCr of 1.19 mg/dL).   Medical History: Past Medical History:  Diagnosis Date   Abnormal stress test 10/21/2019   Acute pain of left knee 04/14/2020   Altered mental status 10/25/2018   Atrial fibrillation with rapid ventricular response (HCC) 10/08/2018   Atrial fibrillation with rapid ventricular response (HCC) 10/08/2018   Cardiomyopathy (HCC) 10/25/2018   EF 25-30% on ST. 40% on ECHO 2020.  LVH   Chronic pain syndrome 04/27/2020   Coronary artery disease of native artery of native heart with stable angina pectoris (HCC) 12/02/2019   Formatting of this note might be different from the original. 10/2019. Cath at [facility] LAD 45%. EF 55-60%   Degeneration of lumbar intervertebral disc 07/30/2015   DOE (dyspnea on exertion) 10/16/2018   Facet arthropathy, cervical 01/30/2020   Formatting of this note might be different from the original. Added automatically from request for surgery [id number]  Formatting of this note might be different from the original. Added automatically from request for surgery [id number]   Fatigue 06/06/2019   Hearing loss 10/15/2018   High risk medication use 07/30/2015   Long-term use of aspirin therapy 10/15/2018   Malaise and fatigue 10/25/2018   Mild cognitive impairment 12/10/2018   Formatting of this note might be different from the original. Okay on eval   Mild episode of recurrent major depressive disorder (HCC)  07/30/2015   Last Assessment & Plan:  Relevant Hx: Course: Daily Update: Today's Plan:will refill his med for him today and he is stable with this  Electronically signed by: [name], FNP 07/30/15 [phone]   Mixed hyperlipidemia 07/30/2015   Pain of cervical facet joint 01/30/2020   Formatting of this note might be different from the original. Added automatically from request for surgery [id number]   Paroxysmal atrial fibrillation (HCC) 05/06/2019   Permanent atrial fibrillation (HCC) 10/08/2018   Persistent atrial fibrillation, status post cardioversion in July 2020 about flip back to atrial fibrillation a few weeks later 01/21/2019   Postlaminectomy syndrome of lumbar region 04/27/2020   Postural dizziness with presyncope 10/25/2018   Prediabetes 07/30/2015   Right shoulder injury 10/08/2018   S/P arthroscopy of right shoulder 03/12/2019   S/P rotator cuff repair 03/12/2019   SI (sacroiliac) pain 07/27/2020   Tear of right supraspinatus tendon 10/25/2018   Tremor 07/30/2015   Vitamin B 12 deficiency 07/30/2015   White matter disease, unspecified 11/19/2018   Noted on MRI of brain 2020. Discussed with pt and wife.    Medications:  (Not in a hospital admission)   Assessment: 66 YOM who presents with chest pain to start IV heparin for ACS. H/H, Plt and SCr wnl. Of note, patient is on Eliquis at home for h/o Afib. Last dose was this AM.   Goal of Therapy:  Heparin level 0.3-0.7 units/ml aPTT 66-102 seconds Monitor platelets by anticoagulation protocol: Yes   Plan:  -Start Heparin 1350 units/hr  -  F/u 6 hr HL/aPTT -Monitor daily HL, aPTT, CBC and s/s of bleeding   Vinnie Level, PharmD., BCPS, BCCCP Clinical Pharmacist Please refer to [facility] for unit-specific pharmacist

## 2021-02-15 NOTE — ED Provider Notes (Signed)
Emergency Medicine Provider Triage Evaluation Note  Richard Spence , Spence 65 y.o. male  was evaluated in triage.  Pt complains of chest pain.  Patient was sent here by his cardiologist after getting results from Spence coronary CT angio with Spence complex lesion involving the proximal portion of left anterior descending artery.  Lesion appears to be severe stenosis of the proximal LAD.  Flow studies indicated that lesion is severely stenotic.  He was advised to go to the emergency room to be admitted as soon as possible.  He will be directed meant to get Spence cardiac cath.  Patient is on anticoagulation.  Patient is currently having some mild chest pain that radiates to bilateral shoulders.  He says he has been having this for about 2 weeks.  He has associated fatigue and generalized weakness.  Denies any short shortness of breath, nausea, vomiting, abdominal pain.   Review of Systems  Positive: Chest pain, fatigue, generalized weakness Negative: Shortness of breath, nausea, vomiting, abdominal pain  Physical Exam  BP 124/79   Pulse (!) 52   Temp 98.3 F (36.8 C) (Oral)   Resp 16   Ht 6' (1.829 m)   Wt 104.3 kg   SpO2 100%   BMI 31.19 kg/m  Gen:   Awake, no distress   Resp:  Normal effort.  Lungs clear to auscultation. MSK:   Moves extremities without difficulty  Other:  Heart sounds normal with no murmurs.  Pulses 2+ in all extremities.  Medical Decision Making  Medically screening exam initiated at 3:12 PM.  Appropriate orders placed.  Richard Spence was informed that the remainder of the evaluation will be completed by another provider, this initial triage assessment does not replace that evaluation, and the importance of remaining in the ED until their evaluation is complete.   Richard Leach, PA-C 02/15/21 1515    Richard Rockers A, DO 02/15/21 1751

## 2021-02-15 NOTE — H&P (Addendum)
Cardiology Admission History and Physical:   Patient ID: Richard Spence MRN: [id number]; DOB: March 11, 1956   Admission date: 02/15/2021  PCP:  Gordan PaymentGrisso, Greg A., MD   [facility] Providers Cardiologist:  Gypsy Balsamobert Krasowski, MD  Electrophysiologist:  Regan LemmingWill Martin Camnitz, MD  {   Chief Complaint:  Chest pain/abnormal coronary CT  Patient Profile:   Richard Spence is a 65 y.o. male with paroxysmal afib, cardiomyopathy with improved EF from 30% to 50-55%,  OSA, nonobstructive CAD, HLD and PreDM who is being seen 02/15/2021 for the evaluation of chest pain with abnormal coronary CTa.  History of Present Illness:   Richard Spence is a 65 yo male with PMH noted above. Has undergone cardioversion in the past. Later placed on flecainide 50mg  BID and has maintained SR. Stroke risk reduction with Eliquis 5mg  BID given elevated ChadsVasc score. 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 Underwent stress testing at that time as well. Also with hx of non-obstructive CAD on cardiac catheterization with 45% stenosis in the pLAD.   [facility] with chest pain and rule out. Continued to have episodes of chest pain and was seen back in the office on 8/29. Stated he had trouble with exertional dyspnea and chest tightness. He was seen by Dr. Bing MatterKrasowski and set up for outpatient coronary CTa. This was completed today and reported back with significant lesion in pLAD with FFR of 0.64. He was called by Dr. Bing MatterKrasowski and instructed to present to the ED for admission with plans for cardiac cath.   In the ED his labs showed stable electrolytes, Cr 1.19, hsTn 4, WBC 6.8, Hgb 16.1. EKG showed SB 58 bpm with no acute ST/T wave changes. CXR negative.   Denies any chest pain at the time of assessment.    Past Medical History:  Diagnosis Date   Abnormal stress test 10/21/2019   Acute pain of left knee 04/14/2020   Altered mental status 10/25/2018   Atrial  fibrillation with rapid ventricular response (HCC) 10/08/2018   Atrial fibrillation with rapid ventricular response (HCC) 10/08/2018   Cardiomyopathy (HCC) 10/25/2018   EF 25-30% on ST. 40% on ECHO 2020.  LVH   Chronic pain syndrome 04/27/2020   Coronary artery disease of native artery of native heart with stable angina pectoris (HCC) 12/02/2019   Formatting of this note might be different from the original. 10[facility]2019. [facility]%. EF 55-60%   Degeneration of lumbar intervertebral disc 07/30/2015   DOE (dyspnea on exertion) 10/16/2018   Facet arthropathy, cervical 01/30/2020   Formatting of this note might be different from the original. Added automatically from request for surgery [id number]  Formatting of this note might be different from the original. Added automatically from request for surgery [id number]   Fatigue 06/06/2019   Hearing loss 10/15/2018   High risk medication use 07/30/2015   Long-term use of aspirin therapy 10/15/2018   Malaise and fatigue 10/25/2018   Mild cognitive impairment 12/10/2018   Formatting of this note might be different from the original. 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 Added automatically from request for surgery [id number]   Paroxysmal atrial fibrillation (HCC) 05/06/2019   Permanent atrial fibrillation (HCC) 10/08/2018   Persistent atrial fibrillation, status post cardioversion in July 2020 about flip back to atrial fibrillation a few weeks later 01/21/2019   Postlaminectomy syndrome of lumbar region 04/27/2020   Postural dizziness with presyncope 10/25/2018   Prediabetes 07/30/2015    Right shoulder injury 10/08/2018   S/P arthroscopy of right shoulder 03/12/2019   S/P rotator cuff repair 03/12/2019   SI (sacroiliac) pain 07/27/2020   Tear of right supraspinatus tendon 10/25/2018   Tremor 07/30/2015   Vitamin B 12 deficiency 07/30/2015   White matter disease, unspecified 11/19/2018   Noted on MRI of brain 2020. Discussed with pt and wife.    Past Surgical History:  Procedure Laterality Date   BACK SURGERY     CHOLECYSTECTOMY     HERNIA REPAIR  2009   LEFT HEART CATH AND CORONARY ANGIOGRAPHY N/A 10/25/2019   Procedure: LEFT HEART CATH AND CORONARY ANGIOGRAPHY;  Surgeon: [place], Peter M, MD;  Location: [facility] INVASIVE CV LAB;  Service: Cardiovascular;  Laterality: N/A;   ROTATOR CUFF REPAIR  03/07/2019   SHOULDER SURGERY Left 12/2010   Rotater cuff   SPINAL FUSION  1998   Dr Nadene Rubins   [facility] SURGERY  2001   Spinal hardware removal by Dr. Lilian Kapur     Medications Prior to Admission: Prior to Admission medications   Medication Sig Start Date End Date Taking? Authorizing Provider  acetaminophen (TYLENOL) 500 MG tablet Take 1,000 mg by mouth every 6 (six) hours as needed for moderate pain or headache.    [provider]  apixaban (ELIQUIS) 5 MG TABS tablet Take 1 tablet (5 mg total) by mouth 2 (two) times daily. 10/26/19   [place], Peter M, MD  carvedilol (COREG) 6.25 MG tablet Take 1 tablet (6.25 mg total) by mouth 2 (two) times daily. 11/03/20   Georgeanna Lea, MD  Cholecalciferol (VITAMIN D3) 125 MCG (5000 UT) CAPS Take 5,000 Units by mouth daily.     [provider]  diclofenac Sodium (VOLTAREN) 1 % GEL Apply 1 application topically 4 (four) times daily as needed (pain).    [provider]  diltiazem (CARDIZEM CD) 120 MG 24 hr capsule Take 120 mg by mouth daily.  11/01/18 02/01/21  [provider]  flecainide (TAMBOCOR) 50 MG tablet TAKE 1 TABLET BY MOUTH TWICE (2) DAILY Patient taking differently: Take 50 mg by mouth 2 (two) times daily. TAKE  1 TABLET BY MOUTH TWICE (2) DAILY 09/14/20   Georgeanna Lea, MD  nitroGLYCERIN (NITROSTAT) 0.4 MG SL tablet Place 1 tablet (0.4 mg total) under the tongue every 5 (five) minutes as needed. 02/01/21 05/02/21  Georgeanna Lea, MD  PARoxetine (PAXIL) 40 MG tablet Take 40 mg by mouth daily.  10/10/18   [provider]  pregabalin (LYRICA) 75 MG capsule Take 1 capsule by mouth 2 (two) times daily. 08/17/20   [provider]  ranolazine (RANEXA) 500 MG 12 hr tablet Take 1 tablet (500 mg total) by mouth 2 (two) times daily. 02/01/21   Georgeanna Lea, MD  rosuvastatin (CRESTOR) 20 MG tablet TAKE ONE TABLET BY MOUTH EVERY DAY Patient taking differently: Take 20 mg by mouth daily. 10/13/20   Georgeanna Lea, MD  vitamin B-12 (CYANOCOBALAMIN) 1000 MCG tablet Take 1,000 mcg by mouth daily.    [provider]     Allergies:  No Known Allergies  Social History:   Social History   Socioeconomic History   Marital status: Married    Spouse name: Not on file   Number of children: Not on file   Years of education: Not on file   Highest education level: Not on file  Occupational History   Not on file  Tobacco Use   Smoking status: Never   Smokeless tobacco: Never  Vaping Use   Vaping Use: Never used  Substance and Sexual Activity   Alcohol use: Yes    Comment: once in a while   Drug use: Not Currently   Sexual activity: Not on file  Other Topics Concern   Not on file  Social History Narrative   Not on file   Social Determinants of Health   Financial Resource Strain: Not on file  Food Insecurity: Not on file  Transportation Needs: Not on file  Physical Activity: Not on file  Stress: Not on file  Social Connections: Not on file  Intimate Partner Violence: Not on file    Family History:   The patient's family history includes Breast cancer in his mother; Dementia in his maternal grandmother and mother; Hypertension in his father and mother; Multiple  sclerosis in his son; Prostate cancer in his maternal grandfather.    ROS:  Please see the history of present illness.  All other ROS reviewed and negative.     Physical Exam/Data:   Vitals:   02/15/21 1455 02/15/21 1458  BP:  124/79  Pulse:  (!) 52  Resp:  16  Temp:  98.3 F (36.8 C)  TempSrc:  Oral  SpO2:  100%  Weight: 104.3 kg   Height: 6' (1.829 m)    No intake or output data in the 24 hours ending 02/15/21 1654 Last 3 Weights 02/15/2021 02/01/2021 08/24/2020  Weight (lbs) 230 lb 230 lb 12.8 oz 218 lb  Weight (kg) 104.327 kg 104.69 kg 98.884 kg     Body mass index is 31.19 kg/m.  General:  Well nourished, well developed, in no acute distress HEENT: normal Neck: no JVD Vascular: No carotid bruits; FA pulses 2+ bilaterally without bruits  Cardiac:  normal S1, S2; RRR; no murmur  Lungs:  clear to auscultation bilaterally, no wheezing, rhonchi or rales  Abd: soft, nontender, no hepatomegaly  Ext: no edema Musculoskeletal:  No deformities, BUE and BLE strength normal and equal Skin: warm and dry  Neuro:  CNs 2-12 intact, no focal abnormalities noted Psych:  Normal affect    EKG:  The ECG that was done 9/12 was personally reviewed and demonstrates SB, 52 bpm  Relevant CV Studies:  Cath: 10/2019  Prox LAD lesion is 45% stenosed. The left ventricular systolic function is normal. LV end diastolic pressure is normal. The left ventricular ejection fraction is 55-65% by visual estimate.   1. Nonobstructive CAD 2. Normal LV function 3. Normal LVEDP   Plan: medical therapy. May resume Eliquis in am.  Diagnostic Dominance: Right   Laboratory Data:  High Sensitivity Troponin:   Recent Labs  Lab 02/15/21 1516  TROPONINIHS 4      Chemistry Recent Labs  Lab 02/15/21 1516  NA 136  K 4.0  CL 104  CO2 25  GLUCOSE 88  BUN 17  CREATININE 1.19  CALCIUM 9.0  GFRNONAA >60  ANIONGAP 7    No results for input(s): PROT, ALBUMIN, AST, ALT, ALKPHOS, BILITOT in  the last 168 hours. Hematology Recent Labs  Lab 02/15/21 1516  WBC 6.8  RBC 5.25  HGB 16.1  HCT 48.9  MCV 93.1  MCH 30.7  MCHC 32.9  RDW 13.5  PLT 230   BNPNo results for input(s): BNP, PROBNP in the last 168 hours.  DDimer No results for input(s): DDIMER in the last 168 hours.   Radiology/Studies:  DG Chest 2 View  Result Date: 02/15/2021 CLINICAL DATA:  Chest pain. EXAM: CHEST - 2 VIEW COMPARISON:  Chest x-ray 10/21/2019. CT chest 01/21/2021. FINDINGS: Cardiomediastinal silhouette is within normal limits. Moderate size hiatal hernia is unchanged from the prior study. There is no focal lung infiltrate, pleural effusion or pneumothorax. Degenerative changes affect the spine. IMPRESSION: No active cardiopulmonary disease. Electronically Signed   By: Darliss Cheney M.D.   On: 02/15/2021 16:33   CT CORONARY MORPH W/CTA COR W/SCORE W/CA W/CM &/OR WO/CM  Addendum Date: 02/15/2021   ADDENDUM REPORT: 02/15/2021 12:54 CLINICAL DATA:  CP with normal stress test EXAM: Cardiac/Coronary  CTA TECHNIQUE: The patient was scanned on a [employer]. FINDINGS: A 120 kV prospective scan was triggered in the descending thoracic aorta at 111 HU's. Axial non-contrast 3 mm slices were carried out through the heart. The data set was analyzed on a dedicated work station and scored using the Agatson method. Gantry rotation speed was 250 msecs and collimation was .6 mm. No beta blockade and 0.8 mg of sl NTG was given. The 3D data set was reconstructed in 5% intervals of the 67-82 % of the R-R cycle. Diastolic phases were analyzed on a dedicated work station using MPR, MIP and VRT modes. The patient received 80 cc of contrast. Aorta:  Normal size.  No calcifications.  No dissection. Aortic Valve:  Trileaflet.  No calcifications. Coronary Arteries:  Normal coronary origin.  Right dominance. RCA is a large dominant artery that gives rise to PDA and PLA. In the mid portion of the artery calcified, minimal, non  obstructive (0-25%) plaque is noted. Left main is a large artery that gives rise to LAD and LCX arteries. In the distal portion of this artery small, calcified, non obstructive (0-25%) plaque is noted. LAD is a large vessel. In the proximal portion of this artery complex plaque is present with intraplaque dye penetration. This plaque cause severe stenosis (70-79%). Small D1 is noted distally. LCX is a non-dominant artery that trifurcates to moderate size OM1, moderate size OM2 and CX. OM2 has minimal non obstructive (0-25%) calcified plaque. Other findings: Normal pulmonary vein drainage into the left atrium. Right mid pulmonary vein noted - normal variant. Normal left atrial appendage without a thrombus. Normal size of the pulmonary artery. IMPRESSION: 1. Coronary calcium score of 92. 2. Normal coronary origin with right dominance. 3. CAD-RADS 4 Severe stenosis. (70-99% or > 50% left main). Cardiac catheterization or CT FFR is recommended. Consider symptom-guided anti-ischemic pharmacotherapy as well as risk factor modification per guideline directed care. Georgeanna Lea, MD Electronically Signed   By: [name] M.D.   On: 02/15/2021 12:54   Result Date: 02/15/2021 EXAM: OVER-READ INTERPRETATION  CT CHEST The following report is an over-read performed by radiologist Dr. Charlett Nose of [facility] on 02/15/2021. This over-read does not include interpretation of cardiac or coronary anatomy or pathology. The coronary CTA interpretation by the cardiologist is attached. COMPARISON:  01/21/2021 FINDINGS: Vascular: Heart is normal size.  Aorta normal caliber. Mediastinum/Nodes: No adenopathy. Small hiatal hernia again noted, unchanged. Lungs/Pleura: No confluent opacities or effusions. Upper Abdomen: Imaging into the upper abdomen  demonstrates no acute findings. Musculoskeletal: Chest wall soft tissues are unremarkable. No acute bony abnormality. IMPRESSION: No acute extra cardiac abnormality.  Electronically Signed: By: Charlett Nose M.D. On: 02/15/2021 09:55   CT CORONARY FRACTIONAL FLOW RESERVE DATA PREP  Result Date: 02/15/2021 EXAM: FFRCT ANALYSIS FINDINGS: FFRct analysis was performed on the original cardiac CT angiogram dataset. Diagrammatic representation of the FFRct analysis is provided in a separate PDF document in PACS. This dictation was created using the PDF document and an interactive 3D model of the results. 3D model is not available in the EMR/PACS. Normal FFR range is >0.80. 1. Left Main: 0.98 2. LAD: Prox 0.96, distal to prox LAD lesion 0.64, distal LAD 0.59 3. LCX: Prox 0.97, mid 0.97, distal 0.96 4. RCA: Prox 0.98, mid 0.97, distal 0.96 IMPRESSION: Hemodynamically significant proximal LAD stenosis noted with FFR 0.64 Georgeanna Lea, MD Electronically Signed   By: [name] M.D.   On: 02/15/2021 13:58    Assessment and Plan:   Richard Spence is a 65 y.o. male with paroxysmal afib, cardiomyopathy with improved EF from 30% to 50-55%,  OSA, nonobstructive CAD, HLD and PreDM who is being seen 02/15/2021 for the evaluation of chest pain with abnormal coronary CTa.  Chest pain with abnormal CTa: coronary CTa today reported with hemodynamically significant lesion in the pLAD with FFR 0.64. He was called and advised to present to the ED for admission with plans for cardiac cath. -- patient last Eliquis dose was 7am this morning, plan for cardiac cath tomorrow afternoon. -- The patient understands that risks included but are not limited to stroke (1 in 1000), death (1 in 1000), kidney failure [usually temporary] (1 in 500), bleeding (1 in 200), allergic reaction [possibly serious] (1 in 200).   -- ASA, statin  -- check echo  Paroxysmal Afib: currently in SR on admission -- hold Eliquis with plans for cardiac cath, IV heparin for now  -- continue Cardizem 120mg  daily, coreg 6.25mg  BID and flecainide 50mg  BID  HLD: on Crestor 20mg  daily -- check lipids in  am  HTN: stable with current regimen -- continue cardizem 120mg  daily, coreg 6.25mg  BID  Risk Assessment/Risk Scores:   HEAR Score (for undifferentiated chest pain):  HEAR Score: 4{  Severity of Illness: The appropriate patient status for this patient is OBSERVATION. Observation status is judged to be reasonable and necessary in order to provide the required intensity of service to ensure the patient's safety. The patient's presenting symptoms, physical exam findings, and initial radiographic and laboratory data in the context of their medical condition is felt to place them at decreased risk for further clinical deterioration. Furthermore, it is anticipated that the patient will be medically stable for discharge from the hospital within 2 midnights of admission. The following factors support the patient status of observation.   " The patient's presenting symptoms include chest pain. " The physical exam findings include stable exam. " The initial radiographic and laboratory data are abnormal coronary CT.   For questions or updates, please contact [facility] Please consult [email or web address] for contact info under     Signed, , NP  02/15/2021 4:54 PM   Agree with note by NP-C  Richard Spence is a patient of Dr. Laverda Page.  He has a history of nonischemic cardiomyopathy with improvement in his EF to near normal by 2D echo.  He underwent cardiac catheterization by Dr. 04/17/2021 in May 2021 revealing nonobstructive disease with 45% proximal LAD stenosis.  He has PAF maintaining sinus rhythm on Eliquis oral anticoagulation as well as hyperlipidemia and essential hypertension.  He began having chest pain approxi-3 weeks ago and was admitted to [facility] overnight several weeks ago.  He has had escalating angina since that time.  He had a coronary CTA with FFR this morning that suggested a hemodynamically significant proximal LAD stenosis.  His last dose of  Eliquis was this morning.  He did have some chest pain today but he is asymptomatic now.  His exam is benign.  Troponins are low.  His EKG shows no acute changes.  I am going to heparinize him and plan for diagnostic coronary angiography tomorrow afternoon after his Eliquis wears off.  He has a good radial pulse.   Runell Gess, M.D., FACP, [facility] [address] [place], [place]  [address]  [phone] 02/15/2021 5:43 PM

## 2021-02-15 NOTE — ED Triage Notes (Signed)
Pt is a direct admit  Being admitted for a cardiac catheterization  Pt complaining of tightness in chest  A&Ox4

## 2021-02-16 ENCOUNTER — Observation Stay (HOSPITAL_COMMUNITY): Payer: Medicare HMO

## 2021-02-16 ENCOUNTER — Encounter (HOSPITAL_COMMUNITY): Payer: Self-pay | Admitting: Cardiovascular Disease

## 2021-02-16 ENCOUNTER — Encounter (HOSPITAL_COMMUNITY)
Admission: EM | Disposition: A | Discharge status: Home / Self Care | Payer: Self-pay | Source: Ambulatory Visit | Attending: Cardiovascular Disease

## 2021-02-16 DIAGNOSIS — I10 Essential (primary) hypertension: Secondary | ICD-10-CM

## 2021-02-16 DIAGNOSIS — Z820 Family history of epilepsy and other diseases of the nervous system: Secondary | ICD-10-CM | POA: Diagnosis not present

## 2021-02-16 DIAGNOSIS — R7303 Prediabetes: Secondary | ICD-10-CM | POA: Diagnosis present

## 2021-02-16 DIAGNOSIS — Z8042 Family history of malignant neoplasm of prostate: Secondary | ICD-10-CM | POA: Diagnosis not present

## 2021-02-16 DIAGNOSIS — Z8249 Family history of ischemic heart disease and other diseases of the circulatory system: Secondary | ICD-10-CM | POA: Diagnosis not present

## 2021-02-16 DIAGNOSIS — G4733 Obstructive sleep apnea (adult) (pediatric): Secondary | ICD-10-CM | POA: Diagnosis present

## 2021-02-16 DIAGNOSIS — Z23 Encounter for immunization: Secondary | ICD-10-CM | POA: Diagnosis present

## 2021-02-16 DIAGNOSIS — R079 Chest pain, unspecified: Secondary | ICD-10-CM | POA: Diagnosis present

## 2021-02-16 DIAGNOSIS — E538 Deficiency of other specified B group vitamins: Secondary | ICD-10-CM | POA: Diagnosis present

## 2021-02-16 DIAGNOSIS — G894 Chronic pain syndrome: Secondary | ICD-10-CM | POA: Diagnosis present

## 2021-02-16 DIAGNOSIS — I200 Unstable angina: Secondary | ICD-10-CM | POA: Diagnosis not present

## 2021-02-16 DIAGNOSIS — R931 Abnormal findings on diagnostic imaging of heart and coronary circulation: Secondary | ICD-10-CM

## 2021-02-16 DIAGNOSIS — I25118 Atherosclerotic heart disease of native coronary artery with other forms of angina pectoris: Secondary | ICD-10-CM | POA: Diagnosis not present

## 2021-02-16 DIAGNOSIS — Z803 Family history of malignant neoplasm of breast: Secondary | ICD-10-CM | POA: Diagnosis not present

## 2021-02-16 DIAGNOSIS — E782 Mixed hyperlipidemia: Secondary | ICD-10-CM

## 2021-02-16 DIAGNOSIS — Z7901 Long term (current) use of anticoagulants: Secondary | ICD-10-CM | POA: Diagnosis not present

## 2021-02-16 DIAGNOSIS — Z7902 Long term (current) use of antithrombotics/antiplatelets: Secondary | ICD-10-CM | POA: Diagnosis not present

## 2021-02-16 DIAGNOSIS — F329 Major depressive disorder, single episode, unspecified: Secondary | ICD-10-CM | POA: Diagnosis present

## 2021-02-16 DIAGNOSIS — Z20822 Contact with and (suspected) exposure to covid-19: Secondary | ICD-10-CM | POA: Diagnosis present

## 2021-02-16 DIAGNOSIS — I4821 Permanent atrial fibrillation: Secondary | ICD-10-CM | POA: Diagnosis present

## 2021-02-16 DIAGNOSIS — I480 Paroxysmal atrial fibrillation: Secondary | ICD-10-CM | POA: Diagnosis not present

## 2021-02-16 DIAGNOSIS — I25110 Atherosclerotic heart disease of native coronary artery with unstable angina pectoris: Secondary | ICD-10-CM | POA: Diagnosis present

## 2021-02-16 DIAGNOSIS — I428 Other cardiomyopathies: Secondary | ICD-10-CM | POA: Diagnosis present

## 2021-02-16 DIAGNOSIS — Z7982 Long term (current) use of aspirin: Secondary | ICD-10-CM | POA: Diagnosis not present

## 2021-02-16 HISTORY — PX: CORONARY ULTRASOUND/IVUS: CATH118244

## 2021-02-16 HISTORY — PX: LEFT HEART CATH AND CORONARY ANGIOGRAPHY: CATH118249

## 2021-02-16 HISTORY — PX: CORONARY STENT INTERVENTION: CATH118234

## 2021-02-16 LAB — LIPID PANEL
Cholesterol: 134 mg/dL (ref 0–200)
HDL: 58 mg/dL (ref >40)
LDL Cholesterol: 59 mg/dL (ref 0–99)
Total CHOL/HDL Ratio: 2.3 RATIO
Triglycerides: 87 mg/dL (ref <150)
VLDL: 17 mg/dL (ref 0–40)

## 2021-02-16 LAB — ECHOCARDIOGRAM COMPLETE
Area-P 1/2: 2.24 cm2
Height: 72 in
S' Lateral: 3.4 cm
Weight: 3667.2 oz

## 2021-02-16 LAB — APTT
aPTT: 71 seconds — ABNORMAL HIGH (ref 24–36)
aPTT: 72 seconds — ABNORMAL HIGH (ref 24–36)

## 2021-02-16 LAB — BASIC METABOLIC PANEL
Anion gap: 6 (ref 5–15)
BUN: 17 mg/dL (ref 8–23)
CO2: 27 mmol/L (ref 22–32)
Calcium: 8.5 mg/dL — ABNORMAL LOW (ref 8.9–10.3)
Chloride: 105 mmol/L (ref 98–111)
Creatinine, Ser: 1.13 mg/dL (ref 0.61–1.24)
GFR, Estimated: >60 mL/min (ref >60)
Glucose, Bld: 90 mg/dL (ref 70–99)
Potassium: 3.8 mmol/L (ref 3.5–5.1)
Sodium: 138 mmol/L (ref 135–145)

## 2021-02-16 LAB — CBC
HCT: 42.6 % (ref 39.0–52.0)
Hemoglobin: 14.5 g/dL (ref 13.0–17.0)
MCH: 31 pg (ref 26.0–34.0)
MCHC: 34 g/dL (ref 30.0–36.0)
MCV: 91.2 fL (ref 80.0–100.0)
Platelets: 196 10*3/uL (ref 150–400)
RBC: 4.67 MIL/uL (ref 4.22–5.81)
RDW: 13.7 % (ref 11.5–15.5)
WBC: 6.8 10*3/uL (ref 4.0–10.5)
nRBC: 0 % (ref 0.0–0.2)

## 2021-02-16 LAB — POCT ACTIVATED CLOTTING TIME
Activated Clotting Time: 451 seconds
Activated Clotting Time: 329 seconds

## 2021-02-16 LAB — HIV ANTIBODY (ROUTINE TESTING W REFLEX): HIV Screen 4th Generation wRfx: NONREACTIVE

## 2021-02-16 LAB — HEPARIN LEVEL (UNFRACTIONATED): Heparin Unfractionated: >1.1 IU/mL — ABNORMAL HIGH (ref 0.30–0.70)

## 2021-02-16 SURGERY — LEFT HEART CATH AND CORONARY ANGIOGRAPHY
Anesthesia: LOCAL

## 2021-02-16 MED ORDER — MIDAZOLAM HCL 2 MG/2ML IJ SOLN
INTRAMUSCULAR | Status: AC
  Filled 2021-02-16: qty 2

## 2021-02-16 MED ORDER — IOHEXOL 350 MG/ML SOLN
INTRAVENOUS | Status: DC | PRN
  Administered 2021-02-16: 140 mL

## 2021-02-16 MED ORDER — VERAPAMIL HCL 2.5 MG/ML IV SOLN
INTRAVENOUS | Status: DC | PRN
  Administered 2021-02-16 (×2): 10 mL via INTRA_ARTERIAL

## 2021-02-16 MED ORDER — NITROGLYCERIN 1 MG/10 ML FOR IR/CATH LAB
INTRA_ARTERIAL | Status: DC | PRN
  Administered 2021-02-16 (×4): 200 ug via INTRACORONARY

## 2021-02-16 MED ORDER — SODIUM CHLORIDE 0.9% FLUSH
3.0000 mL | Freq: Two times a day (BID) | INTRAVENOUS | Status: DC
  Administered 2021-02-16 (×2): 3 mL via INTRAVENOUS

## 2021-02-16 MED ORDER — SODIUM CHLORIDE 0.9 % IV SOLN: INTRAVENOUS | Status: AC

## 2021-02-16 MED ORDER — SODIUM CHLORIDE 0.9 % WEIGHT BASED INFUSION
1.0000 mL/kg/h | INTRAVENOUS | Status: DC
  Administered 2021-02-16: 1 mL/kg/h via INTRAVENOUS

## 2021-02-16 MED ORDER — NITROGLYCERIN 1 MG/10 ML FOR IR/CATH LAB
INTRA_ARTERIAL | Status: AC
  Filled 2021-02-16: qty 10

## 2021-02-16 MED ORDER — ONDANSETRON HCL 4 MG/2ML IJ SOLN: 4.0000 mg | Freq: Four times a day (QID) | INTRAMUSCULAR | Status: DC | PRN

## 2021-02-16 MED ORDER — VERAPAMIL HCL 2.5 MG/ML IV SOLN
INTRAVENOUS | Status: AC
  Filled 2021-02-16: qty 2

## 2021-02-16 MED ORDER — LIDOCAINE HCL (PF) 1 % IJ SOLN
INTRAMUSCULAR | Status: DC | PRN
  Administered 2021-02-16: 2 mL

## 2021-02-16 MED ORDER — HYDRALAZINE HCL 20 MG/ML IJ SOLN: 10.0000 mg | INTRAMUSCULAR | Status: AC | PRN

## 2021-02-16 MED ORDER — HEPARIN (PORCINE) IN NACL 1000-0.9 UT/500ML-% IV SOLN
INTRAVENOUS | Status: DC | PRN
  Administered 2021-02-16 (×2): 500 mL

## 2021-02-16 MED ORDER — SODIUM CHLORIDE 0.9% FLUSH: 3.0000 mL | INTRAVENOUS | Status: DC | PRN

## 2021-02-16 MED ORDER — ASPIRIN 81 MG PO CHEW: 81.0000 mg | CHEWABLE_TABLET | Freq: Every day | ORAL | Status: DC

## 2021-02-16 MED ORDER — MIDAZOLAM HCL 2 MG/2ML IJ SOLN
INTRAMUSCULAR | Status: DC | PRN
  Administered 2021-02-16: 2 mg via INTRAVENOUS

## 2021-02-16 MED ORDER — TICAGRELOR 90 MG PO TABS
ORAL_TABLET | ORAL | Status: DC | PRN
  Administered 2021-02-16: 180 mg via ORAL

## 2021-02-16 MED ORDER — SODIUM CHLORIDE 0.9% FLUSH
3.0000 mL | Freq: Two times a day (BID) | INTRAVENOUS | Status: DC
  Administered 2021-02-16 – 2021-02-17 (×2): 3 mL via INTRAVENOUS

## 2021-02-16 MED ORDER — SODIUM CHLORIDE 0.9 % WEIGHT BASED INFUSION
3.0000 mL/kg/h | INTRAVENOUS | Status: DC
  Administered 2021-02-16: 3 mL/kg/h via INTRAVENOUS

## 2021-02-16 MED ORDER — TICAGRELOR 90 MG PO TABS
90.0000 mg | ORAL_TABLET | Freq: Two times a day (BID) | ORAL | Status: DC
  Administered 2021-02-17: 90 mg via ORAL
  Filled 2021-02-16: qty 1

## 2021-02-16 MED ORDER — HEPARIN (PORCINE) IN NACL 1000-0.9 UT/500ML-% IV SOLN
INTRAVENOUS | Status: AC
  Filled 2021-02-16: qty 1000

## 2021-02-16 MED ORDER — HEPARIN SODIUM (PORCINE) 1000 UNIT/ML IJ SOLN
INTRAMUSCULAR | Status: DC | PRN
  Administered 2021-02-16: 8000 [IU] via INTRAVENOUS
  Administered 2021-02-16: 5000 [IU] via INTRAVENOUS

## 2021-02-16 MED ORDER — SODIUM CHLORIDE 0.9 % IV SOLN: 250.0000 mL | INTRAVENOUS | Status: DC | PRN

## 2021-02-16 MED ORDER — INFLUENZA VAC A&B SA ADJ QUAD 0.5 ML IM PRSY
0.5000 mL | PREFILLED_SYRINGE | INTRAMUSCULAR | Status: AC
  Administered 2021-02-17: 0.5 mL via INTRAMUSCULAR
  Filled 2021-02-16: qty 0.5

## 2021-02-16 MED ORDER — HEPARIN SODIUM (PORCINE) 1000 UNIT/ML IJ SOLN
INTRAMUSCULAR | Status: AC
  Filled 2021-02-16: qty 1

## 2021-02-16 MED ORDER — LABETALOL HCL 5 MG/ML IV SOLN: 10.0000 mg | INTRAVENOUS | Status: AC | PRN

## 2021-02-16 MED ORDER — FENTANYL CITRATE (PF) 100 MCG/2ML IJ SOLN
INTRAMUSCULAR | Status: DC | PRN
  Administered 2021-02-16: 25 ug via INTRAVENOUS

## 2021-02-16 MED ORDER — FENTANYL CITRATE (PF) 100 MCG/2ML IJ SOLN
INTRAMUSCULAR | Status: AC
  Filled 2021-02-16: qty 2

## 2021-02-16 MED ORDER — LIDOCAINE HCL (PF) 1 % IJ SOLN
INTRAMUSCULAR | Status: AC
  Filled 2021-02-16: qty 30

## 2021-02-16 MED ORDER — ACETAMINOPHEN 325 MG PO TABS: 650.0000 mg | ORAL_TABLET | ORAL | Status: DC | PRN

## 2021-02-16 SURGICAL SUPPLY — 20 items
BALLN SAPPHIRE 2.75X15 (BALLOONS) ×2
BALLN SAPPHIRE ~~LOC~~ 3.75X18 (BALLOONS) ×1 IMPLANT
BALLOON SAPPHIRE 2.75X15 (BALLOONS) IMPLANT
CATH 5FR JL3.5 JR4 ANG PIG MP (CATHETERS) ×1 IMPLANT
CATH LAUNCHER 6FR EBU3.5 (CATHETERS) ×1 IMPLANT
CATH OPTICROSS HD (CATHETERS) ×1 IMPLANT
DEVICE RAD COMP TR BAND LRG (VASCULAR PRODUCTS) ×1 IMPLANT
GLIDESHEATH SLEND SS 6F .021 (SHEATH) ×1 IMPLANT
GUIDEWIRE INQWIRE 1.5J.035X260 (WIRE) IMPLANT
INQWIRE 1.5J .035X260CM (WIRE) ×2
KIT ENCORE 26 ADVANTAGE (KITS) ×1 IMPLANT
KIT HEART LEFT (KITS) ×2 IMPLANT
KIT HEMO VALVE WATCHDOG (MISCELLANEOUS) ×1 IMPLANT
PACK CARDIAC CATHETERIZATION (CUSTOM PROCEDURE TRAY) ×2 IMPLANT
SLED PULL BACK IVUS (MISCELLANEOUS) ×1 IMPLANT
STENT ONYX FRONTIER 3.5X34 (Permanent Stent) ×1 IMPLANT
SYR MEDRAD MARK 7 150ML (SYRINGE) ×2 IMPLANT
TRANSDUCER W/STOPCOCK (MISCELLANEOUS) ×2 IMPLANT
TUBING CIL FLEX 10 FLL-RA (TUBING) ×2 IMPLANT
WIRE ASAHI PROWATER 180CM (WIRE) ×1 IMPLANT

## 2021-02-16 NOTE — Progress Notes (Signed)
ANTICOAGULATION CONSULT NOTE   Pharmacy Consult for heparin Indication: chest pain/ACS  No Known Allergies  Patient Measurements: Height: 6' (182.9 cm) Weight: 104 kg (229 lb 3.2 oz) IBW/kg (Calculated) : 77.6 Heparin Dosing Weight: 99 kg   Vital Signs: Temp: 98.4 F (36.9 C) (09/13 0111) Temp Source: Oral (09/13 0111) BP: 122/69 (09/13 0111) Pulse Rate: 49 (09/13 0111)  Labs: Recent Labs    02/15/21 1516 02/15/21 1811 02/16/21 0317  HGB 16.1  --   --   HCT 48.9  --   --   PLT 230  --   --   APTT  --   --  71*  HEPARINUNFRC  --   --  >1.10*  CREATININE 1.19  --   --   TROPONINIHS 4 3  --      Estimated Creatinine Clearance: 76.2 mL/min (by C-G formula based on SCr of 1.19 mg/dL).   Medical History: Past Medical History:  Diagnosis Date   Abnormal stress test 10/21/2019   Acute pain of left knee 04/14/2020   Altered mental status 10/25/2018   Atrial fibrillation with rapid ventricular response (HCC) 10/08/2018   Atrial fibrillation with rapid ventricular response (HCC) 10/08/2018   Cardiomyopathy (HCC) 10/25/2018   EF 25-30% on ST. 40% on ECHO 2020.  LVH   Chronic pain syndrome 04/27/2020   Coronary artery disease of native artery of native heart with stable angina pectoris (HCC) 12/02/2019   Formatting of this note might be different from the original. 10/2019. Cath at [facility] LAD 45%. EF 55-60%   Degeneration of lumbar intervertebral disc 07/30/2015   DOE (dyspnea on exertion) 10/16/2018   Facet arthropathy, cervical 01/30/2020   Formatting of this note might be different from the original. Added automatically from request for surgery [id number]  Formatting of this note might be different from the original. Added automatically from request for surgery [id number]   Fatigue 06/06/2019   Hearing loss 10/15/2018   High risk medication use 07/30/2015   Long-term use of aspirin therapy 10/15/2018   Malaise and fatigue 10/25/2018   Mild cognitive impairment 12/10/2018   Formatting of this  note might be different from the original. Okay on eval   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   Last Assessment & Plan:  Relevant Hx: Course: Daily Update: Today's Plan:will refill his med for him today and he is stable with this  Electronically signed by: [name], FNP 07/30/15 [phone]   Mixed hyperlipidemia 07/30/2015   Pain of cervical facet joint 01/30/2020   Formatting of this note might be different from the original. Added automatically from request for surgery [id number]   Paroxysmal atrial fibrillation (HCC) 05/06/2019   Permanent atrial fibrillation (HCC) 10/08/2018   Persistent atrial fibrillation, status post cardioversion in July 2020 about flip back to atrial fibrillation a few weeks later 01/21/2019   Postlaminectomy syndrome of lumbar region 04/27/2020   Postural dizziness with presyncope 10/25/2018   Prediabetes 07/30/2015   Right shoulder injury 10/08/2018   S/P arthroscopy of right shoulder 03/12/2019   S/P rotator cuff repair 03/12/2019   SI (sacroiliac) pain 07/27/2020   Tear of right supraspinatus tendon 10/25/2018   Tremor 07/30/2015   Vitamin B 12 deficiency 07/30/2015   White matter disease, unspecified 11/19/2018   Noted on MRI of brain 2020. Discussed with pt and wife.    Medications:  Medications Prior to Admission  Medication Sig Dispense Refill Last Dose   apixaban (ELIQUIS) 5 MG TABS tablet  Take 1 tablet (5 mg total) by mouth 2 (two) times daily. 60 tablet  02/15/2021 at 0700   carvedilol (COREG) 6.25 MG tablet Take 1 tablet (6.25 mg total) by mouth 2 (two) times daily. 60 tablet 2 02/15/2021 at 0700   Cholecalciferol (VITAMIN D3) 125 MCG (5000 UT) CAPS Take 5,000 Units by mouth daily.    02/15/2021   diltiazem (CARDIZEM CD) 120 MG 24 hr capsule Take 120 mg by mouth daily.    02/15/2021   flecainide (TAMBOCOR) 50 MG tablet TAKE 1 TABLET BY MOUTH TWICE (2) DAILY (Patient taking differently: Take 50 mg by mouth 2 (two) times daily. TAKE  1 TABLET BY MOUTH TWICE (2) DAILY) 180 tablet 2 02/15/2021   nitroGLYCERIN (NITROSTAT) 0.4 MG SL tablet Place 1 tablet (0.4 mg total) under the tongue every 5 (five) minutes as needed. 30 tablet 3 02/15/2021   PARoxetine (PAXIL) 40 MG tablet Take 40 mg by mouth daily.    02/15/2021   pregabalin (LYRICA) 75 MG capsule Take 1 capsule by mouth 2 (two) times daily.   02/15/2021   ranolazine (RANEXA) 500 MG 12 hr tablet Take 1 tablet (500 mg total) by mouth 2 (two) times daily. 180 tablet 3 02/15/2021   rosuvastatin (CRESTOR) 20 MG tablet TAKE ONE TABLET BY MOUTH EVERY DAY (Patient taking differently: Take 20 mg by mouth daily.) 90 tablet 2 02/15/2021   vitamin B-12 (CYANOCOBALAMIN) 1000 MCG tablet Take 1,000 mcg by mouth daily.   02/15/2021    Assessment: 59 YOM who presents with chest pain to start IV heparin for ACS. H/H, Plt and SCr wnl. Of note, patient is on Eliquis at home for h/o Afib. Last dose was 9/12 am.   Initial heparin level >1.1 as expected given recent apixaban. Aptt is within range at 71s. No bleeding issues noted.   Goal of Therapy:  Heparin level 0.3-0.7 units/ml aPTT 66-102 seconds Monitor platelets by anticoagulation protocol: Yes   Plan:  -Continue Heparin at 1350 units/hr  -F/u confirmatory aptt -Monitor daily HL, aPTT, CBC and s/s of bleeding   Sheppard Coil PharmD., BCPS Clinical Pharmacist 02/16/2021 4:07 AM

## 2021-02-16 NOTE — Progress Notes (Signed)
  Echocardiogram 2D Echocardiogram has been performed.  Augustine Radar 02/16/2021, 9:30 AM

## 2021-02-16 NOTE — ED Notes (Addendum)
Attempted to give report x3. Nurse unavailable. No one will take report.

## 2021-02-16 NOTE — Progress Notes (Signed)
Progress Note  Patient Name: Richard Spence Date of Encounter: 02/16/2021  [facility] Cardiologist: [name], MD   Subjective   Patient had episode of chest pain last night.  He is currently pain-free on IV heparin.  Scheduled for cardiac catheterization at 130 this afternoon with Dr. Eldridge Dace  Inpatient Medications    Scheduled Meds:  aspirin EC  81 mg Oral Daily   carvedilol  6.25 mg Oral BID   diltiazem  120 mg Oral Daily   flecainide  50 mg Oral BID   PARoxetine  40 mg Oral Daily   pregabalin  75 mg Oral BID   rosuvastatin  20 mg Oral Daily   sodium chloride flush  3 mL Intravenous Q12H   Continuous Infusions:  sodium chloride     sodium chloride 1 mL/kg/hr (02/16/21 0545)   heparin 1,350 Units/hr (02/15/21 1832)   PRN Meds: sodium chloride, acetaminophen, nitroGLYCERIN, ondansetron (ZOFRAN) IV, sodium chloride flush   Vital Signs    Vitals:   02/16/21 0043 02/16/21 0111 02/16/21 0434 02/16/21 0708  BP: 119/75 122/69 128/75 124/78  Pulse: (!) 48 (!) 49 (!) 53 (!) 52  Resp: 16 20 17 17   Temp: 98 F (36.7 C) 98.4 F (36.9 C) 97.9 F (36.6 C) 97.7 F (36.5 C)  TempSrc: Oral Oral Oral Oral  SpO2: 96% 94% 93% 94%  Weight:  104 kg    Height:  6' (1.829 m)      Intake/Output Summary (Last 24 hours) at 02/16/2021 1015 Last data filed at 02/16/2021 0651 Gross per 24 hour  Intake 578.11 ml  Output 575 ml  Net 3.11 ml   Last 3 Weights 02/16/2021 02/15/2021 02/01/2021  Weight (lbs) 229 lb 3.2 oz 230 lb 230 lb 12.8 oz  Weight (kg) 103.964 kg 104.327 kg 104.69 kg      Telemetry    Sinus rhythm/sinus bradycardia- Personally Reviewed  ECG    Not performed today- Personally Reviewed  Physical Exam   GEN: No acute distress.   Neck: No JVD Cardiac: RRR, no murmurs, rubs, or gallops.  Respiratory: Clear to auscultation bilaterally. GI: Soft, nontender, non-distended  MS: No edema; No deformity. Neuro:  Nonfocal  Psych: Normal affect   Labs     High Sensitivity Troponin:   Recent Labs  Lab 02/15/21 1516 02/15/21 1811  TROPONINIHS 4 3      Chemistry Recent Labs  Lab 02/15/21 1516 02/16/21 0317  NA 136 138  K 4.0 3.8  CL 104 105  CO2 25 27  GLUCOSE 88 90  BUN 17 17  CREATININE 1.19 1.13  CALCIUM 9.0 8.5*  GFRNONAA >60 >60  ANIONGAP 7 6     Hematology Recent Labs  Lab 02/15/21 1516  WBC 6.8  RBC 5.25  HGB 16.1  HCT 48.9  MCV 93.1  MCH 30.7  MCHC 32.9  RDW 13.5  PLT 230    BNPNo results for input(s): BNP, PROBNP in the last 168 hours.   DDimer No results for input(s): DDIMER in the last 168 hours.   Radiology    DG Chest 2 View  Result Date: 02/15/2021 CLINICAL DATA:  Chest pain. EXAM: CHEST - 2 VIEW COMPARISON:  Chest x-ray 10/21/2019. CT chest 01/21/2021. FINDINGS: Cardiomediastinal silhouette is within normal limits. Moderate size hiatal hernia is unchanged from the prior study. There is no focal lung infiltrate, pleural effusion or pneumothorax. Degenerative changes affect the spine. IMPRESSION: No active cardiopulmonary disease. Electronically Signed   By: 01/23/2021  Malena Edman M.D.   On: 02/15/2021 16:33   CT CORONARY MORPH W/CTA COR W/SCORE W/CA W/CM &/OR WO/CM  Addendum Date: 02/15/2021   ADDENDUM REPORT: 02/15/2021 12:54 CLINICAL DATA:  CP with normal stress test EXAM: Cardiac/Coronary  CTA TECHNIQUE: The patient was scanned on a [employer]. FINDINGS: A 120 kV prospective scan was triggered in the descending thoracic aorta at 111 HU's. Axial non-contrast 3 mm slices were carried out through the heart. The data set was analyzed on a dedicated work station and scored using the Agatson method. Gantry rotation speed was 250 msecs and collimation was .6 mm. No beta blockade and 0.8 mg of sl NTG was given. The 3D data set was reconstructed in 5% intervals of the 67-82 % of the R-R cycle. Diastolic phases were analyzed on a dedicated work station using MPR, MIP and VRT modes. The patient received  80 cc of contrast. Aorta:  Normal size.  No calcifications.  No dissection. Aortic Valve:  Trileaflet.  No calcifications. Coronary Arteries:  Normal coronary origin.  Right dominance. RCA is a large dominant artery that gives rise to PDA and PLA. In the mid portion of the artery calcified, minimal, non obstructive (0-25%) plaque is noted. Left main is a large artery that gives rise to LAD and LCX arteries. In the distal portion of this artery small, calcified, non obstructive (0-25%) plaque is noted. LAD is a large vessel. In the proximal portion of this artery complex plaque is present with intraplaque dye penetration. This plaque cause severe stenosis (70-79%). Small D1 is noted distally. LCX is a non-dominant artery that trifurcates to moderate size OM1, moderate size OM2 and CX. OM2 has minimal non obstructive (0-25%) calcified plaque. Other findings: Normal pulmonary vein drainage into the left atrium. Right mid pulmonary vein noted - normal variant. Normal left atrial appendage without a thrombus. Normal size of the pulmonary artery. IMPRESSION: 1. Coronary calcium score of 92. 2. Normal coronary origin with right dominance. 3. CAD-RADS 4 Severe stenosis. (70-99% or > 50% left main). Cardiac catheterization or CT FFR is recommended. Consider symptom-guided anti-ischemic pharmacotherapy as well as risk factor modification per guideline directed care. Georgeanna Lea, MD Electronically Signed   By: [name] M.D.   On: 02/15/2021 12:54   Result Date: 02/15/2021 EXAM: OVER-READ INTERPRETATION  CT CHEST The following report is an over-read performed by radiologist Dr. Charlett Nose of [facility] on 02/15/2021. This over-read does not include interpretation of cardiac or coronary anatomy or pathology. The coronary CTA interpretation by the cardiologist is attached. COMPARISON:  01/21/2021 FINDINGS: Vascular: Heart is normal size.  Aorta normal caliber. Mediastinum/Nodes: No adenopathy.  Small hiatal hernia again noted, unchanged. Lungs/Pleura: No confluent opacities or effusions. Upper Abdomen: Imaging into the upper abdomen demonstrates no acute findings. Musculoskeletal: Chest wall soft tissues are unremarkable. No acute bony abnormality. IMPRESSION: No acute extra cardiac abnormality. Electronically Signed: By: Charlett Nose M.D. On: 02/15/2021 09:55   CT CORONARY FRACTIONAL FLOW RESERVE DATA PREP  Result Date: 02/15/2021 EXAM: FFRCT ANALYSIS FINDINGS: FFRct analysis was performed on the original cardiac CT angiogram dataset. Diagrammatic representation of the FFRct analysis is provided in a separate PDF document in PACS. This dictation was created using the PDF document and an interactive 3D model of the results. 3D model is not available in the EMR/PACS. Normal FFR range is >0.80. 1. Left Main: 0.98 2. LAD: Prox 0.96, distal to prox LAD lesion 0.64, distal LAD 0.59 3. LCX: Prox 0.97,  mid 0.97, distal 0.96 4. RCA: Prox 0.98, mid 0.97, distal 0.96 IMPRESSION: Hemodynamically significant proximal LAD stenosis noted with FFR 0.64 Georgeanna Lea, MD Electronically Signed   By: [name] M.D.   On: 02/15/2021 13:58    Cardiac Studies   2D echo performed today but results not yet available  Patient Profile     DETAVIOUS RINN is a 65 y.o. male with paroxysmal afib, cardiomyopathy with improved EF from 30% to 50-55%,  OSA, nonobstructive CAD, HLD and PreDM who is being seen 02/15/2021 for the evaluation of chest pain with abnormal coronary CTa  Assessment & Plan    1: Chest pain-history of nonobstructive CAD by cath 10/25/2019 performed Dr. [place].  He has had chest pain starting over the last 3 weeks.  Coronary CTA performed yesterday suggested physiologically significant lesion in the proximal LAD with an FFR of 0.64.  He is on IV heparin.  Plan for cardiac catheterization later today.  2: Paroxysmal A. fib-on Eliquis as an outpatient maintaining sinus rhythm.  His last  dose of Eliquis was yesterday morning.  3: Hyperlipidemia-on Crestor  4: Essential hypertension-blood pressure well controlled here in the hospital on diltiazem and carvedilol  For questions or updates, please contact [facility] Please consult [email or web address] for contact info under        Signed, Nanetta Batty, MD  02/16/2021, 10:15 AM

## 2021-02-16 NOTE — Interval H&P Note (Signed)
Cath Lab Visit (complete for each Cath Lab visit)  Clinical Evaluation Leading to the Procedure:   ACS: No.  Non-ACS:    Anginal Classification: CCS III  Anti-ischemic medical therapy: Minimal Therapy (1 class of medications)  Non-Invasive Test Results: High-risk stress test findings: cardiac mortality >3%/year  Prior CABG: No previous CABG   Abnormal CTA   History and Physical Interval Note:  02/16/2021 1:45 PM  Richard Spence  has presented today for surgery, with the diagnosis of abnormal ct.  The various methods of treatment have been discussed with the patient and family. After consideration of risks, benefits and other options for treatment, the patient has consented to  Procedure(s): LEFT HEART CATH AND CORONARY ANGIOGRAPHY (N/A) as a surgical intervention.  The patient's history has been reviewed, patient examined, no change in status, stable for surgery.  I have reviewed the patient's chart and labs.  Questions were answered to the patient's satisfaction.     Lance Muss

## 2021-02-16 NOTE — Progress Notes (Signed)
ANTICOAGULATION CONSULT NOTE   Pharmacy Consult for heparin Indication: chest pain/ACS  No Known Allergies  Patient Measurements: Height: 6' (182.9 cm) Weight: 104 kg (229 lb 3.2 oz) IBW/kg (Calculated) : 77.6 Heparin Dosing Weight: 99 kg   Vital Signs: Temp: 97.7 F (36.5 C) (09/13 0708) Temp Source: Oral (09/13 0708) BP: 124/78 (09/13 0708) Pulse Rate: 52 (09/13 0708)  Labs: Recent Labs    02/15/21 1516 02/15/21 1811 02/16/21 0317 02/16/21 1017  HGB 16.1  --   --  14.5  HCT 48.9  --   --  42.6  PLT 230  --   --  196  APTT  --   --  71* 72*  HEPARINUNFRC  --   --  >1.10*  --   CREATININE 1.19  --  1.13  --   TROPONINIHS 4 3  --   --      Estimated Creatinine Clearance: 80.2 mL/min (by C-G formula based on SCr of 1.13 mg/dL).   Medical History: Past Medical History:  Diagnosis Date   Abnormal stress test 10/21/2019   Acute pain of left knee 04/14/2020   Altered mental status 10/25/2018   Atrial fibrillation with rapid ventricular response (HCC) 10/08/2018   Atrial fibrillation with rapid ventricular response (HCC) 10/08/2018   Cardiomyopathy (HCC) 10/25/2018   EF 25-30% on ST. 40% on ECHO 2020.  LVH   Chronic pain syndrome 04/27/2020   Coronary artery disease of native artery of native heart with stable angina pectoris (HCC) 12/02/2019   Formatting of this note might be different from the original. 10/2019. Cath at [facility] LAD 45%. EF 55-60%   Degeneration of lumbar intervertebral disc 07/30/2015   DOE (dyspnea on exertion) 10/16/2018   Facet arthropathy, cervical 01/30/2020   Formatting of this note might be different from the original. Added automatically from request for surgery [id number]  Formatting of this note might be different from the original. Added automatically from request for surgery [id number]   Fatigue 06/06/2019   Hearing loss 10/15/2018   High risk medication use 07/30/2015   Long-term use of aspirin therapy 10/15/2018   Malaise and fatigue 10/25/2018   Mild  cognitive impairment 12/10/2018   Formatting of this note might be different from the original. 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 Added automatically from request for surgery [id number]   Paroxysmal atrial fibrillation (HCC) 05/06/2019   Permanent atrial fibrillation (HCC) 10/08/2018   Persistent atrial fibrillation, status post cardioversion in July 2020 about flip back to atrial fibrillation a few weeks later 01/21/2019   Postlaminectomy syndrome of lumbar region 04/27/2020   Postural dizziness with presyncope 10/25/2018   Prediabetes 07/30/2015   Right shoulder injury 10/08/2018   S/P arthroscopy of right shoulder 03/12/2019   S/P rotator cuff repair 03/12/2019   SI (sacroiliac) pain 07/27/2020   Tear of right supraspinatus tendon 10/25/2018   Tremor 07/30/2015   Vitamin B 12 deficiency 07/30/2015   White matter disease, unspecified 11/19/2018   Noted on MRI of brain 2020. Discussed with pt and wife.    Medications:  Medications Prior to Admission  Medication  Sig Dispense Refill Last Dose   apixaban (ELIQUIS) 5 MG TABS tablet Take 1 tablet (5 mg total) by mouth 2 (two) times daily. 60 tablet  02/15/2021 at 0700   carvedilol (COREG) 6.25 MG tablet Take 1 tablet (6.25 mg total) by mouth 2 (two) times daily. 60 tablet 2 02/15/2021 at 0700   Cholecalciferol (VITAMIN D3) 125 MCG (5000 UT) CAPS Take 5,000 Units by mouth daily.    02/15/2021   diltiazem (CARDIZEM CD) 120 MG 24 hr capsule Take 120 mg by mouth daily.    02/15/2021   flecainide (TAMBOCOR) 50 MG tablet TAKE 1 TABLET BY MOUTH TWICE (2) DAILY (Patient taking  differently: Take 50 mg by mouth 2 (two) times daily. TAKE 1 TABLET BY MOUTH TWICE (2) DAILY) 180 tablet 2 02/15/2021   nitroGLYCERIN (NITROSTAT) 0.4 MG SL tablet Place 1 tablet (0.4 mg total) under the tongue every 5 (five) minutes as needed. 30 tablet 3 02/15/2021   PARoxetine (PAXIL) 40 MG tablet Take 40 mg by mouth daily.    02/15/2021   pregabalin (LYRICA) 75 MG capsule Take 1 capsule by mouth 2 (two) times daily.   02/15/2021   ranolazine (RANEXA) 500 MG 12 hr tablet Take 1 tablet (500 mg total) by mouth 2 (two) times daily. 180 tablet 3 02/15/2021   rosuvastatin (CRESTOR) 20 MG tablet TAKE ONE TABLET BY MOUTH EVERY DAY (Patient taking differently: Take 20 mg by mouth daily.) 90 tablet 2 02/15/2021   vitamin B-12 (CYANOCOBALAMIN) 1000 MCG tablet Take 1,000 mcg by mouth daily.   02/15/2021    Assessment: 24 YOM who presents with chest pain to start IV heparin for ACS. H/H, Plt and SCr wnl. Of note, patient is on Eliquis at home for h/o Afib. Last dose was 9/12 am.   -aPTT at goal on 1350 units/hr. Plans for cath today  Goal of Therapy:  Heparin level 0.3-0.7 units/ml aPTT 66-102 seconds Monitor platelets by anticoagulation protocol: Yes   Plan:  -Continue Heparin at 1350 units/hr  -Will follow plans post cath  Harland German, PharmD Clinical Pharmacist **Pharmacist phone directory can now be found on [email or web address] ([email or web address]).  Listed under [facility].

## 2021-02-16 NOTE — H&P (View-Only) (Signed)
 Progress Note  Patient Name: Richard Spence Date of Encounter: 02/16/2021  [facility] Cardiologist: Robert Krasowski, MD   Subjective   Patient had episode of chest pain last night.  He is currently pain-free on IV heparin.  Scheduled for cardiac catheterization at 130 this afternoon with Dr. Varanasi  Inpatient Medications    Scheduled Meds:  aspirin EC  81 mg Oral Daily   carvedilol  6.25 mg Oral BID   diltiazem  120 mg Oral Daily   flecainide  50 mg Oral BID   PARoxetine  40 mg Oral Daily   pregabalin  75 mg Oral BID   rosuvastatin  20 mg Oral Daily   sodium chloride flush  3 mL Intravenous Q12H   Continuous Infusions:  sodium chloride     sodium chloride 1 mL/kg/hr (02/16/21 0545)   heparin 1,350 Units/hr (02/15/21 1832)   PRN Meds: sodium chloride, acetaminophen, nitroGLYCERIN, ondansetron (ZOFRAN) IV, sodium chloride flush   Vital Signs    Vitals:   02/16/21 0043 02/16/21 0111 02/16/21 0434 02/16/21 0708  BP: 119/75 122/69 128/75 124/78  Pulse: (!) 48 (!) 49 (!) 53 (!) 52  Resp: 16 20 17 17  Temp: 98 F (36.7 C) 98.4 F (36.9 C) 97.9 F (36.6 C) 97.7 F (36.5 C)  TempSrc: Oral Oral Oral Oral  SpO2: 96% 94% 93% 94%  Weight:  104 kg    Height:  6' (1.829 m)      Intake/Output Summary (Last 24 hours) at 02/16/2021 1015 Last data filed at 02/16/2021 0651 Gross per 24 hour  Intake 578.11 ml  Output 575 ml  Net 3.11 ml   Last 3 Weights 02/16/2021 02/15/2021 02/01/2021  Weight (lbs) 229 lb 3.2 oz 230 lb 230 lb 12.8 oz  Weight (kg) 103.964 kg 104.327 kg 104.69 kg      Telemetry    Sinus rhythm/sinus bradycardia- Personally Reviewed  ECG    Not performed today- Personally Reviewed  Physical Exam   GEN: No acute distress.   Neck: No JVD Cardiac: RRR, no murmurs, rubs, or gallops.  Respiratory: Clear to auscultation bilaterally. GI: Soft, nontender, non-distended  MS: No edema; No deformity. Neuro:  Nonfocal  Psych: Normal affect   Labs     High Sensitivity Troponin:   Recent Labs  Lab 02/15/21 1516 02/15/21 1811  TROPONINIHS 4 3      Chemistry Recent Labs  Lab 02/15/21 1516 02/16/21 0317  NA 136 138  K 4.0 3.8  CL 104 105  CO2 25 27  GLUCOSE 88 90  BUN 17 17  CREATININE 1.19 1.13  CALCIUM 9.0 8.5*  GFRNONAA >60 >60  ANIONGAP 7 6     Hematology Recent Labs  Lab 02/15/21 1516  WBC 6.8  RBC 5.25  HGB 16.1  HCT 48.9  MCV 93.1  MCH 30.7  MCHC 32.9  RDW 13.5  PLT 230    BNPNo results for input(s): BNP, PROBNP in the last 168 hours.   DDimer No results for input(s): DDIMER in the last 168 hours.   Radiology    DG Chest 2 View  Result Date: 02/15/2021 CLINICAL DATA:  Chest pain. EXAM: CHEST - 2 VIEW COMPARISON:  Chest x-ray 10/21/2019. CT chest 01/21/2021. FINDINGS: Cardiomediastinal silhouette is within normal limits. Moderate size hiatal hernia is unchanged from the prior study. There is no focal lung infiltrate, pleural effusion or pneumothorax. Degenerative changes affect the spine. IMPRESSION: No active cardiopulmonary disease. Electronically Signed   By: Amy    Malena Edman M.D.   On: 02/15/2021 16:33   CT CORONARY MORPH W/CTA COR W/SCORE W/CA W/CM &/OR WO/CM  Addendum Date: 02/15/2021   ADDENDUM REPORT: 02/15/2021 12:54 CLINICAL DATA:  CP with normal stress test EXAM: Cardiac/Coronary  CTA TECHNIQUE: The patient was scanned on a [employer]. FINDINGS: A 120 kV prospective scan was triggered in the descending thoracic aorta at 111 HU's. Axial non-contrast 3 mm slices were carried out through the heart. The data set was analyzed on a dedicated work station and scored using the Agatson method. Gantry rotation speed was 250 msecs and collimation was .6 mm. No beta blockade and 0.8 mg of sl NTG was given. The 3D data set was reconstructed in 5% intervals of the 67-82 % of the R-R cycle. Diastolic phases were analyzed on a dedicated work station using MPR, MIP and VRT modes. The patient received  80 cc of contrast. Aorta:  Normal size.  No calcifications.  No dissection. Aortic Valve:  Trileaflet.  No calcifications. Coronary Arteries:  Normal coronary origin.  Right dominance. RCA is a large dominant artery that gives rise to PDA and PLA. In the mid portion of the artery calcified, minimal, non obstructive (0-25%) plaque is noted. Left main is a large artery that gives rise to LAD and LCX arteries. In the distal portion of this artery small, calcified, non obstructive (0-25%) plaque is noted. LAD is a large vessel. In the proximal portion of this artery complex plaque is present with intraplaque dye penetration. This plaque cause severe stenosis (70-79%). Small D1 is noted distally. LCX is a non-dominant artery that trifurcates to moderate size OM1, moderate size OM2 and CX. OM2 has minimal non obstructive (0-25%) calcified plaque. Other findings: Normal pulmonary vein drainage into the left atrium. Right mid pulmonary vein noted - normal variant. Normal left atrial appendage without a thrombus. Normal size of the pulmonary artery. IMPRESSION: 1. Coronary calcium score of 92. 2. Normal coronary origin with right dominance. 3. CAD-RADS 4 Severe stenosis. (70-99% or > 50% left main). Cardiac catheterization or CT FFR is recommended. Consider symptom-guided anti-ischemic pharmacotherapy as well as risk factor modification per guideline directed care. Georgeanna Lea, MD Electronically Signed   By: [name] M.D.   On: 02/15/2021 12:54   Result Date: 02/15/2021 EXAM: OVER-READ INTERPRETATION  CT CHEST The following report is an over-read performed by radiologist Dr. Charlett Nose of [facility] on 02/15/2021. This over-read does not include interpretation of cardiac or coronary anatomy or pathology. The coronary CTA interpretation by the cardiologist is attached. COMPARISON:  01/21/2021 FINDINGS: Vascular: Heart is normal size.  Aorta normal caliber. Mediastinum/Nodes: No adenopathy.  Small hiatal hernia again noted, unchanged. Lungs/Pleura: No confluent opacities or effusions. Upper Abdomen: Imaging into the upper abdomen demonstrates no acute findings. Musculoskeletal: Chest wall soft tissues are unremarkable. No acute bony abnormality. IMPRESSION: No acute extra cardiac abnormality. Electronically Signed: By: Charlett Nose M.D. On: 02/15/2021 09:55   CT CORONARY FRACTIONAL FLOW RESERVE DATA PREP  Result Date: 02/15/2021 EXAM: FFRCT ANALYSIS FINDINGS: FFRct analysis was performed on the original cardiac CT angiogram dataset. Diagrammatic representation of the FFRct analysis is provided in a separate PDF document in PACS. This dictation was created using the PDF document and an interactive 3D model of the results. 3D model is not available in the EMR/PACS. Normal FFR range is >0.80. 1. Left Main: 0.98 2. LAD: Prox 0.96, distal to prox LAD lesion 0.64, distal LAD 0.59 3. LCX: Prox 0.97,  mid 0.97, distal 0.96 4. RCA: Prox 0.98, mid 0.97, distal 0.96 IMPRESSION: Hemodynamically significant proximal LAD stenosis noted with FFR 0.64 Robert J Krasowski, MD Electronically Signed   By: Robert  Krasowski M.D.   On: 02/15/2021 13:58    Cardiac Studies   2D echo performed today but results not yet available  Patient Profile     Richard Spence is a 65 y.o. male with paroxysmal afib, cardiomyopathy with improved EF from 30% to 50-55%,  OSA, nonobstructive CAD, HLD and PreDM who is being seen 02/15/2021 for the evaluation of chest pain with abnormal coronary CTa  Assessment & Plan    1: Chest pain-history of nonobstructive CAD by cath 10/25/2019 performed Dr. Jordan.  He has had chest pain starting over the last 3 weeks.  Coronary CTA performed yesterday suggested physiologically significant lesion in the proximal LAD with an FFR of 0.64.  He is on IV heparin.  Plan for cardiac catheterization later today.  2: Paroxysmal A. fib-on Eliquis as an outpatient maintaining sinus rhythm.  His last  dose of Eliquis was yesterday morning.  3: Hyperlipidemia-on Crestor  4: Essential hypertension-blood pressure well controlled here in the hospital on diltiazem and carvedilol  For questions or updates, please contact [facility] Please consult [email or web address] for contact info under        Signed, Kadan Millstein, MD  02/16/2021, 10:15 AM    

## 2021-02-17 ENCOUNTER — Other Ambulatory Visit: Payer: Self-pay | Admitting: Cardiology

## 2021-02-17 ENCOUNTER — Encounter (HOSPITAL_COMMUNITY): Payer: Self-pay | Admitting: Interventional Cardiology

## 2021-02-17 DIAGNOSIS — I480 Paroxysmal atrial fibrillation: Secondary | ICD-10-CM

## 2021-02-17 LAB — CBC
HCT: 43.6 % (ref 39.0–52.0)
Hemoglobin: 14.9 g/dL (ref 13.0–17.0)
MCH: 31.3 pg (ref 26.0–34.0)
MCHC: 34.2 g/dL (ref 30.0–36.0)
MCV: 91.6 fL (ref 80.0–100.0)
Platelets: 195 10*3/uL (ref 150–400)
RBC: 4.76 MIL/uL (ref 4.22–5.81)
RDW: 13.7 % (ref 11.5–15.5)
WBC: 8.5 10*3/uL (ref 4.0–10.5)
nRBC: 0 % (ref 0.0–0.2)

## 2021-02-17 LAB — BASIC METABOLIC PANEL
Anion gap: 10 (ref 5–15)
BUN: 14 mg/dL (ref 8–23)
CO2: 25 mmol/L (ref 22–32)
Calcium: 8.5 mg/dL — ABNORMAL LOW (ref 8.9–10.3)
Chloride: 106 mmol/L (ref 98–111)
Creatinine, Ser: 1.07 mg/dL (ref 0.61–1.24)
GFR, Estimated: >60 mL/min (ref >60)
Glucose, Bld: 85 mg/dL (ref 70–99)
Potassium: 3.8 mmol/L (ref 3.5–5.1)
Sodium: 141 mmol/L (ref 135–145)

## 2021-02-17 LAB — APTT: aPTT: 30 seconds (ref 24–36)

## 2021-02-17 MED ORDER — ASPIRIN 81 MG PO TBEC: 81.0000 mg | DELAYED_RELEASE_TABLET | Freq: Every day | ORAL | 0 refills | Status: DC

## 2021-02-17 MED ORDER — CLOPIDOGREL BISULFATE 75 MG PO TABS
300.0000 mg | ORAL_TABLET | Freq: Once | ORAL | Status: AC
  Administered 2021-02-17: 300 mg via ORAL
  Filled 2021-02-17: qty 4

## 2021-02-17 MED ORDER — APIXABAN 5 MG PO TABS
5.0000 mg | ORAL_TABLET | Freq: Two times a day (BID) | ORAL | Status: DC
  Administered 2021-02-17: 5 mg via ORAL
  Filled 2021-02-17: qty 1

## 2021-02-17 MED ORDER — CLOPIDOGREL BISULFATE 75 MG PO TABS: 75.0000 mg | ORAL_TABLET | Freq: Every day | ORAL | 3 refills | Status: DC

## 2021-02-17 MED ORDER — CARVEDILOL 6.25 MG PO TABS: 6.2500 mg | ORAL_TABLET | Freq: Two times a day (BID) | ORAL | 3 refills | Status: DC

## 2021-02-17 MED ORDER — CLOPIDOGREL BISULFATE 75 MG PO TABS: 75.0000 mg | ORAL_TABLET | Freq: Every day | ORAL | Status: DC

## 2021-02-17 NOTE — Plan of Care (Signed)

## 2021-02-17 NOTE — Research (Addendum)
Reveal Plaque Research Study  Reveal plaque Informed Consent   Subject Name: Richard Spence  Subject met inclusion and exclusion criteria.  The informed consent form, study requirements and expectations were reviewed with the subject and questions and concerns were addressed prior to the signing of the consent form.  The subject verbalized understanding of the trial requirements.  The subject agreed to participate in the Reveal Plaque trial and signed the informed consent at 1630 on 02/16/2021.  The informed consent was obtained prior to performance of any protocol-specific procedures for the subject.  A copy of the signed informed consent was given to the subject and a copy was placed in the subject's medical record.   Richard Spence   Inclusion: [x]  1. Age >51 [x]  2. Clinically stable patient with known CAD. [x]   3. CCTA showing stenosis in at least one major epicardial vessel of stentable/graftable diameter, in whom clinically indicated IVUS is planned within 45 days of the CCTA  and FFR CT available. [x]  4. FFR CT successfully processed. [x]  5. Willing to comply with protocol. [x]  6. Agrees to be included in the study and able to provide written informed consent.  Exclusion: []  1. CCTA showing no stenosis. []  2. Uninterpretable CCTA by HeartFlow assessment, in which image quality prevents FFR CT from being processed. []  3. Acute chest pain. []  4. CABG prior to CCTA acquisition. []  5. Prior history of PCI for 3 or more vessels. []  6. MI less than 30 days prior to CCTA or between CCTA and ICA. []  7. Suspicion of acute coronary syndrome, Acute MI or Unstable Angina. []  8. Known complex congenital heart disease. []  9. Tachycardia or significant arrythmia. []  10. Subject requires an emergent procedure. []  11. Evidence of ongoing or active clinical instability, including acute chest pain  (sudden onset), cardiogenic shock, unstable blood pressure with systolic blood pressure <00 mmHg, and  severe congestive heart failure (NYHA lll or lV) or acute  pulmonary edema.  []  12. Any active, serious, life-threatening disease with a life expectancy of less than 2 months. []  13. Currently enrolled in another study utilizing FFR CT or in an investigational trial that involves a non-approved cardiac drug or device.  []  14. Persons under the protection of justice, guardianship, or curatorship.    Reason for CCTA scan: [x]  Chest Pain []  Asymptomatic/screening     []  Prior MI     []  Dyspnea   []  Palpitations []  Abnormal ECG    []    Abnormal Stress or Perfusion   []  Other Which CT Scanner was used?          []  Philips Model: []  CT 5000 Ingenuity    []  Spectral CT 7500   []  CT6000 iCT    []  IQon         []   Incisive CT    []   Other [x]  Siemens Model: []  Somatom Definition Edge    []  Somatom Definition Flash            [x]  Somatom Force  []  Somatom Drive   []   Other []  Product/process development scientist:  []  Revolution CT  []  CardioGraph   []  Optima CT660  []   Discovery HD 750   []  Other Radiation Exposure for CCTA Only (Sum for All Coronary Series)  CT dose index (CTDI) (mGy)  _____131.88____  Dose-length product (DLP) (mGy-cm) _______2082.7_____   [x]   CCTA Image Uploaded   [x]  CCTA report uploaded  [x]  All subject information redacted from image  and report? Subject Demographics: Age: ___65__    Year of birth:  ___1957_ Birth Sex: [x]  Male   []   Male  Weight: 104_ [] lb  [x]  kg   []  Not done Height: __72__ [x]  in  []  cm   []  Not done  Ethnicity: [x]  Not of Hispanic, Latino/a, or Spanish origin []  Hispanic, Latino/a, or Spanish origin Specify: []   [place] American, Chicano/a      []  [place] Rican  []  [place] [] other Hispanic, Latino/a, or Spanish origin Race:  [x]  White  []  Black  []  American [place] or [place] Native  []  Asian:  Specify: []  Asian [place]   []  [place]  []  Chinese  []  [place]   []  Filipino  []  Other Asian  []  [place]   []  Native Hawaiian or other Pacific Islander               []  Native Hawaiian   []  Guamanian or Chamorro  []  Samoan   []  Other Pacific Islander   []  Other (specify) Medical History: Angina within the l[] ast 45 days:   [x]  Yes   []  No If yes: []  Typical  Atypical  [x]  Dyspnea  []  Noncardiac pain Angina type: [x]  Stable []  Unstable []  Silent Ischemia  []  Unknown []  Other Congestive Heart Failure: []  Yes []  No Diabetes: []  Yes  [x]  No If yes:  []  Type l   []  Type ll Controlled by:  []  Insulin   []  Oral hypoglycemic  []  Diet  []   unknown Hypertension:  [x]  Yes  []   No  (Systolic >093, diastolic >23 or req. medication) Is subject taking anti-hypertensive medication?   [x]  Yes  []   No Hyperlipidemia:   [x]  Yes  []  No Is subject taking anti-hyperlipidemic medication?    [x]  Yes  []  No Tobacco Use:  []  Former  []  Current  [x]   Non-smoker  []   Unknown Any nicotine use in 24 hours prior to CCTA?   []  Yes  [x]   No Family history of premature atherosclerotic disease?   []  Yes  [x]   No (Coronary artery disease, cerebrovascular disease, or peripheral vascular disease for male 2o relatives < 59 and/or male 2o relatives < 75 years old) Stroke:  []  Yes  [x]  No Transient Ischemic Attack (TIA):   []  Yes  [x]  No Peripheral Vascular Disease:  []  Yes  [x]  No Sedentary Lifestyle:    []  Yes   [x]  No Other Relevant Disease or Comorbidity:    []  Yes  [x]  No Specify: ___________________   PCI History: Does the subject have previously stented vessels?   []  Yes  [x]  No ______________ Total number of PCI procedures ______________ Date of most recent PCI ______________ Total number of stented vessels NOTE: CASS Segment Diagram is provided for reference in Appendix A.  PCI History Records: (complete a record for each stent) Stent 1:           Vessel: []  Right coronary artery  []  Left main artery  []  Left circumflex artery []  Left anterior descending artery   []  Other________________ CASS Segment for starting location of stent:  ______________________________ CASS Segment for ending location of stent: _______________________________ Stent manufacturer: _________________________________________________ Diameter of stent (mm): _____________   Length of stent (mm): _____________ Stent 2:           Vessel: []  Right coronary artery  []  Left main artery  []  Left circumflex artery []  Left anterior descending artery   []  Other________________ CASS Segment for starting location of  stent: ______________________________ CASS Segment for ending location of stent: _______________________________ Stent manufacturer: _________________________________________________ Diameter of stent (mm): _____________   Length of stent (mm): _____________  Coronary Tests: Any coronary tests within 90 days prior to enrollment?  []  Yes  [x]  No Invasive Coronary Angiography (ICA):    []  Yes [x]  No Most recent test date: _________________    Result: []  Positive  []  Negative  []  Intermediate  []  Unknown []  Image uploaded       Report: []  Uploaded  []  N/A []  All subject information redacted from images and reports? Exercise Tolerance Test (ETT):  [] Yes  []  No     Most recent test date: Click or tap to enter a date. Result:   []  Positive    []  Negative  []  Intermediate  []   Unknown []  Image uploaded    Report:  []  Uploaded  []  N/A  []   All subject information redacted from images and reports? Stress Echocardiogram: []  Yes  []  No   Most recent date:Click or tap to enter a date. Result:  []  Positive  []  Negative  []  Intermediate  []  Unknown [] Image uploaded    Report:  []  Uploaded  []  N/A      []  All images redacted? Nuclear Myocardial Perfusion Scan:    []  Yes  []  No Most recent test date: Click or tap to enter a date.  Result:   []  Positive    []  Negative  []  Intermediate  []   Unknown [] Image uploaded    Report:  []  Uploaded  []  N/A      []  All images redacted? Cardiac MRI:   []  Yes  []  No Most recent test date: Click or tap to enter a  date.  Result:   []  Positive    []  Negative  []  Intermediate  []   Unknown [] Image uploaded    Report:  []  Uploaded  []  N/A      []  All images redacted? Cardiac PET:    []  Yes  []  No Most recent test date: Click or tap to enter a date.  Result:   []  Positive    []  Negative  []  Intermediate  []   Unknown [] Image uploaded    Report:  []  Uploaded  []  N/A      []  All images redacted?  Baseline Cath Procedure: Date of procedure: 16-Feb-2021 Blood pressure prior to procedure: 136/80  Heart Rate prior to procedure: 55 Time of arterial access: 1349 Time of first lesion treatment: 1424 Time last guide catheter removed: 1446 Aortic pressure (mean mmHg): 86 LVED pressure (mmHg): 13 Arterial access site:   [x]  Radial  []  Femoral  []  Brachial Maximum arterial sheath size (Fr) 6 Venous access needle size:  []  18 G  [x]  20 G  []  4 FR  []  5 FR  []  6 FR  []  Other Left ventriculography performed?  [x]  Yes  []  No If yes, when:   [x]  Pre-intervention  []  Post- intervention LVEF (%):__50-55_____ Were there any procedural complications?      []  Yes  [x]  No []  Dissection from wire  []  Dissection from catheter  []  Slow / no flow []  Allergic reaction to medication  []  Side branch occlusion  []  Other  Was the FFRCT model available & used in cath lab during the procedure? [x]  Y  []  N [x]  Angio image uploaded  [x]  Cath report uploaded  [x]  All subject information redacted from images and reports?  Intraprocedural Medications: Was adenosine administered within  24 hrs of the cath procedure?  []  Yes  [x]  No IV Adenosine start time: _______________ Adenosine dose:  []  140 ug/kg/min  []  Other dose  []  None Was nitroglycerin administered within 24 hrs of the cath procedure? [x]  Yes []  No NTG dose: ___200__ NTG Route: []  IV  [x]  IC  []  Other  Intraprocedural Devices: (total number of each device used during the procedure): _____0____ Pressure wires & FFR wires _______0__ Guidewires   (Do not include wires used  before intervention and pressure/FFR wires) _____2____ Balloon catheters ______1___ Drug eluting stents ______0___ Bare metal stents _____1____ Guide catheters _____1____ Intravascular ultrasound catheters ____0_____ Intravascular ultrasound OCT catheters _____0____ Thrombectomy and atherectomy catheters ______0___ Temporary pacemakers _____0____ Intra-aortic balloon pumps (IABP) Intraprocedural Radiation exposure: Total fluoroscopy time ___13.2_____ (minutes) Absorbed Dose of Radiation __798_   [x]  mGy  []  Gy Dose Area Product: __43607___  []  Gy*cm2  []  cGy*cm2  [x]  mGy*cm2  Other: ______________ Contrast Use: (check all that apply) [x]  Ominipaque  []  Visipaque  []   Isovue  []  Iomeron  []  Ultravist   [] Oxilan []  Hypaque  []  Isopaque  []  Hexibrix  []  OptiRay  []  Iopamidol  Total amount of contrast used (mL): __140___  IVUS/OCT: Model: []  Philips Volcano                    Catheter type:    []  Revolution  []  Eagle Eye Platinum  []  Eagle Eye  P     Platinum ST          []  Refinity ST               [x]  [employer]                   Catheter type:  []  Opticross   [x]  Opticross HD  []  Other                []  Terumo                    Catheter type:   []  ViewIT    []  Intrafocus WR  Ivus Image uploaded:  []  Yes  [x]  No OCT taken?   []  Yes   []  No   [x]  All subject information redacted from images and reports?  IVUS/OCT Records Was automatic pullback used for IVUS/OCT acquisition?   [x]  Yes  []  No      Pullback speed (mm/sec): ___0.5__      Length of pullback (mm): ___53.5 -65.5 -61.5___ Vessel 1: Image type:   [x]  IVUS   []  OCT  []  Right coronary artery  []  Left main artery  []  Left circumflex artery  [x]  Left anterior descending artery   []  Other________________  Guide catheter size:  [] 4 FR  [] 5 FR  [x] 6 FR  [] 7 FR  [] 8 FR Vessel 2: Image type:   []  IVUS   []  OCT  []  Right coronary artery  []  Left main artery  []  Left circumflex artery []  Left anterior descending artery    []  Other________________ CASS Segment for starting location of guidewire: __________________________ CASS Segment for ending location of guidewire: ________________________ Guide catheter size:  [] 4 FR  [] 5 FR  [] 6 FR  [] 7 FR  [] 8 FR Vessel 3: Image type:   []  IVUS   []  OCT  []  Right coronary artery  []  Left main artery  []  Left circumflex artery []  Left anterior descending artery   []  Other________________ CASS Segment for  starting location of guidewire: __________________________ CASS Segment for ending location of guidewire: ________________________ Guide catheter size:  [] 4 FR  [] 5 FR  [] 6 FR  [] 7 FR  [] 8 FR FFR/NHPR Records Measurement taken during cath procedure: []  FFR  []  DFR  []  IFR  []  RFR   [] Other  Vessel 1: []  Right coronary artery  []  Left main artery  []  Left circumflex artery []  Left anterior descending artery   []  Other________________ Contrast used?     []  Yes   []  No CASS Segment for starting location of guide wire: __________________________ CASS Segment for ending location of guide wire: ___________________________ Guide catheter size:  [] 4 FR  [] 5 FR  [] 6 FR  [] 7 FR  [] 8 FR FFR Value: _____________ Pd value: ______________ Pa value: ______________ NHPR value: ____________  Vessel 2: []  Right coronary artery  []  Left main artery  []  Left circumflex artery []  Left anterior descending artery   []  Other________________ Contrast used?     []  Yes   []  No CASS Segment for starting location of guide wire: __________________________ CASS Segment for ending location of guide wire: ___________________________ Guide catheter size:  [] 4 FR  [] 5 FR  [] 6 FR  [] 7 FR  [] 8 FR FFR Value: _____________ Pd value: ______________ Pa value: ______________ NHPR value: ____________  Vessel 3: []  Right coronary artery  []  Left main artery  []  Left circumflex artery []  Left anterior descending artery   []  Other________________ Contrast used?     []  Yes   []  No CASS Segment for  starting location of guide wire: __________________________ CASS Segment for ending location of guide wire: ___________________________ Guide catheter size:  [] 4 FR  [] 5 FR  [] 6 FR  [] 7 FR  [] 8 FR FFR Value: _____________ Pd value: ______________ Pa value: ______________ NHPR value: ____________  FFR Uploads []  Angio Image  []  FFR Report  []  FFR Tracings  []  Screenshot of Angio - starting position of FFR guidewire without contrast []  Screenshot of Angio - starting position of FFR guidewire with contrast  NHPR Uploads []  Angio Image  []  NHPR Report  []  NHPR Tracings  []  Screenshot of Angio - starting position of NHPR guidewire without contrast []  Screenshot of Angio - starting position of NHPR guidewire with contrast []  All subject information redacted from images and reports?  Study completion:  Did subject complete participation in the study?    [x]  Yes  []  No If no, reason: []  Death []  Withdrew consent  []  Other  []  Screen Failure Screen Failure reason:  []  Ivus not used  []  Use of balloon prior   []  Automatic pullback not used

## 2021-02-17 NOTE — Progress Notes (Signed)
Wife of patient called in reporting patient's pharmacy does not have Rx for plavix. I called and spoke with pharmD. Unclear what happened with the order. Verbal order given for plavix 75mg  daily #90 Refill x2. Updated patient on medications being sent.

## 2021-02-17 NOTE — Progress Notes (Signed)
CARDIAC REHAB PHASE I   PRE:  Rate/Rhythm: 54 SB  BP:  Sitting: 132/80      SaO2: 95 RA  MODE:  Ambulation: 470 ft   POST:  Rate/Rhythm: 68 SR  BP:  Sitting: 131/79      SaO2: 100 RA  Pt tolerated exercise well and amb 470 ft independently with standby assist. Pt denies CP, SOB, or dizziness throughout walk. Pt to recliner w/ call bell in reach after walk. Educated pt on risk factors, heart healthy diet, restrictions, NTG use, ASA, and exercise guidelines. Pt was receptive to ed and is excited to be discharged. Will continue to follow.  [id number] Joya San, MS, ACSM-CEP 02/17/2021 10:16 AM

## 2021-02-17 NOTE — Discharge Summary (Addendum)
Discharge Summary    Patient ID: Richard Spence MRN: [id number]; DOB: 10/25/1955  Admit date: 02/15/2021 Discharge date: 02/17/2021  PCP:  [name], MD   [facility] Providers Cardiologist:  [name], MD  Electrophysiologist:  Regan Lemming, MD  {  Discharge Diagnoses    Principal Problem:   Chest pain Active Problems:   Mixed hyperlipidemia   Paroxysmal atrial fibrillation [facility])   Abnormal cardiac CT angiography    Diagnostic Studies/Procedures    Cath: 02/16/21  Prox LAD lesion is 75% stenosed.  IVUS showed eccentric, ulcerative morphology.  Cross sectional area 3.18 mm2.   A drug-eluting stent was successfully placed using a STENT ONYX FRONTIER 3.5X34, postdilated to 3.9 mm and optimized with IVUS.   Post intervention, there is a 0% residual stenosis.   The left ventricular systolic function is normal.   LV end diastolic pressure is normal.   The left ventricular ejection fraction is 50-55% by visual estimate.   There is no aortic valve stenosis.   Continue aggressive secondary prevention.  OK to switch to clopidogrel 75 mg daily after 1 month of Brilinta if there is difficulty obtaining the brand name medicine.   Diagnostic Dominance: Right Intervention    Echo: 02/16/21  IMPRESSIONS     1. Left ventricular ejection fraction, by estimation, is 55 to 60%. The  left ventricle has normal function. The left ventricle has no regional  wall motion abnormalities. Left ventricular diastolic parameters were  normal.   2. Right ventricular systolic function is normal. The right ventricular  size is normal. There is normal pulmonary artery systolic pressure.   3. Left atrial size was moderately dilated.   4. The mitral valve is normal in structure. Trivial mitral valve  regurgitation.   5. The aortic valve is tricuspid. Aortic valve regurgitation is not  visualized. Mild aortic valve sclerosis is present, with no evidence of  aortic valve  stenosis.   FINDINGS   Left Ventricle: Left ventricular ejection fraction, by estimation, is 55  to 60%. The left ventricle has normal function. The left ventricle has no  regional wall motion abnormalities. The left ventricular internal cavity  size was normal in size. There is   no left ventricular hypertrophy. Left ventricular diastolic parameters  were normal. Normal left ventricular filling pressure.   Right Ventricle: The right ventricular size is normal. No increase in  right ventricular wall thickness. Right ventricular systolic function is  normal. There is normal pulmonary artery systolic pressure. The tricuspid  regurgitant velocity is 2.05 m/s, and   with an assumed right atrial pressure of 3 mmHg, the estimated right  ventricular systolic pressure is 19.8 mmHg.   Left Atrium: Left atrial size was moderately dilated.   Right Atrium: Right atrial size was normal in size.   Pericardium: There is no evidence of pericardial effusion.   Mitral Valve: The mitral valve is normal in structure. Trivial mitral  valve regurgitation.   Tricuspid Valve: The tricuspid valve is normal in structure. Tricuspid  valve regurgitation is trivial.   Aortic Valve: The aortic valve is tricuspid. Aortic valve regurgitation is  not visualized. Mild aortic valve sclerosis is present, with no evidence  of aortic valve stenosis.   Pulmonic Valve: The pulmonic valve was grossly normal. Pulmonic valve  regurgitation is not visualized.   Aorta: The aortic root is normal in size and structure.   IAS/Shunts: No atrial level shunt detected by color flow Doppler.  _____________   History  of Present Illness     Richard Spence is a 65 y.o. male with paroxysmal afib, cardiomyopathy with improved EF from 30% to 50-55%,  OSA, nonobstructive CAD, HLD and PreDM who was seen 02/15/2021 for the evaluation of chest pain with abnormal coronary CTa.  Richard Spence is a 65 yo male with PMH noted above. Has  undergone cardioversion in the past. Later placed on flecainide 50mg  BID and has maintained SR. Stroke risk reduction with Eliquis 5mg  BID given elevated ChadsVasc score. 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 Underwent stress testing at that time as well. Also with hx of non-obstructive CAD on cardiac catheterization with 45% stenosis in the pLAD 10/2019.    He recently presented to [facility] with chest pain and ruled out. Continued to have episodes of chest pain and was seen back in the office on 8/29. Stated he had trouble with exertional dyspnea and chest tightness. He was seen by Dr. Bing Matter and set up for outpatient coronary CTa. This was completed the morning of admission and reported back with significant lesion in pLAD with FFR of 0.64. He was called by Dr. Bing Matter and instructed to present to the ED for admission with plans for cardiac cath.    In the ED his labs showed stable electrolytes, Cr 1.19, hsTn 4, WBC 6.8, Hgb 16.1. EKG showed SB 58 bpm with no acute ST/T wave changes. CXR negative.    Denies any chest pain at the time of assessment. Admitted with plans for cardiac cath.     Hospital Course     Chest pain: history of nonobstructive CAD by cath 10/25/2019 performed Dr. [place].  He had chest pain starting over the last 3 weeks and underwent outpatient coronary CTA suggesting physiologically significant lesion in the proximal LAD with an FFR of 0.64.  He was placed on IV heparin. Underwent cardiac cath noted above with 75% lesion in pLAD, IVUS showing eccentric, ulcerative morphology. Treated with PCI/DESx1. Initially placed on DAPT with ASA/Brilinta, but given the need for Eliquis switched to plavix. Given 300mg  load x1 then 75mg  daily. Will plan for triple therapy for one month with ASA, plavix, Eliquis then dropping ASA. No issues post cath. Seen by CR.   Paroxysmal A. Fib: on Eliquis as an outpatient maintaining sinus rhythm throughout  admission. -- resumed on Eliquis post cath. Will continue on coreg 6.25mg  BID, Dilt 120mg  daily and flecainide 50mg  BID.    Hyperlipidemia: LDL 59 -- on Crestor 20mg  daily  Essential hypertension: blood pressure well controlled -- continue coreg 6.25mg  BID and Diltiazem 120mg  daily  General: Well developed, well nourished, male appearing in no acute distress. Head: Normocephalic, atraumatic.  Neck: Supple without bruits, JVD. Lungs:  Resp regular and unlabored, CTA. Heart: RRR, S1, S2, no S3, S4, or murmur; no rub. Abdomen: Soft, non-tender, non-distended with normoactive bowel sounds. No hepatomegaly. No rebound/guarding. No obvious abdominal masses. Extremities: No clubbing, cyanosis, edema. Distal pedal pulses are 2+ bilaterally. Right cath site stable without bruising or hematoma Neuro: Alert and oriented X 3. Moves all extremities spontaneously. Psych: Normal affect.   Patient was seen by Dr. Allyson Sabal and deemed stable for discharge home. Follow up in the office has been arranged. Medications sent to the [facility] pharmacy. Educated by PharmD prior to discharge.   Did the patient have an acute coronary syndrome (MI, NSTEMI, STEMI, etc) this admission?:  No  Did the patient have a percutaneous coronary intervention (stent / angioplasty)?:  Yes.     Cath/PCI Registry Performance & Quality Measures: Aspirin prescribed? - Yes ADP Receptor Inhibitor (Plavix/Clopidogrel, Brilinta/Ticagrelor or Effient/Prasugrel) prescribed (includes medically managed patients)? - Yes High Intensity Statin (Lipitor 40-80mg  or Crestor 20-40mg ) prescribed? - Yes For EF <40%, was ACEI/ARB prescribed? - Not Applicable (EF >/= 40%) For EF <40%, Aldosterone Antagonist (Spironolactone or Eplerenone) prescribed? - Not Applicable (EF >/= 40%) Cardiac Rehab Phase II ordered? - Yes      _____________  Discharge Vitals Blood pressure 132/77, pulse (!) 59, temperature 98.2 F (36.8 C),  temperature source Oral, resp. rate 18, height 6' (1.829 m), weight 104 kg, SpO2 95 %.  Filed Weights   02/15/21 1455 02/16/21 0111  Weight: 104.3 kg 104 kg    Labs & Radiologic Studies    CBC Recent Labs    02/16/21 1017 02/17/21 0247  WBC 6.8 8.5  HGB 14.5 14.9  HCT 42.6 43.6  MCV 91.2 91.6  PLT 196 195   Basic Metabolic Panel Recent Labs    16/10/96 0317 02/17/21 0247  NA 138 141  K 3.8 3.8  CL 105 106  CO2 27 25  GLUCOSE 90 85  BUN 17 14  CREATININE 1.13 1.07  CALCIUM 8.5* 8.5*   Liver Function Tests No results for input(s): AST, ALT, ALKPHOS, BILITOT, PROT, ALBUMIN in the last 72 hours. No results for input(s): LIPASE, AMYLASE in the last 72 hours. High Sensitivity Troponin:   Recent Labs  Lab 02/15/21 1516 02/15/21 1811  TROPONINIHS 4 3    BNP Invalid input(s): POCBNP D-Dimer No results for input(s): DDIMER in the last 72 hours. Hemoglobin A1C No results for input(s): HGBA1C in the last 72 hours. Fasting Lipid Panel Recent Labs    02/16/21 0317  CHOL 134  HDL 58  LDLCALC 59  TRIG 87  CHOLHDL 2.3   Thyroid Function Tests No results for input(s): TSH, T4TOTAL, T3FREE, THYROIDAB in the last 72 hours.  Invalid input(s): FREET3 _____________  DG Chest 2 View  Result Date: 02/15/2021 CLINICAL DATA:  Chest pain. EXAM: CHEST - 2 VIEW COMPARISON:  Chest x-ray 10/21/2019. CT chest 01/21/2021. FINDINGS: Cardiomediastinal silhouette is within normal limits. Moderate size hiatal hernia is unchanged from the prior study. There is no focal lung infiltrate, pleural effusion or pneumothorax. Degenerative changes affect the spine. IMPRESSION: No active cardiopulmonary disease. Electronically Signed   By: Darliss Cheney M.D.   On: 02/15/2021 16:33   CARDIAC CATHETERIZATION  Result Date: 02/16/2021   Prox LAD lesion is 75% stenosed.  IVUS showed eccentric, ulcerative morphology.  Cross sectional area 3.18 mm2.   A drug-eluting stent was successfully placed using  a STENT ONYX FRONTIER 3.5X34, postdilated to 3.9 mm and optimized with IVUS.   Post intervention, there is a 0% residual stenosis.   The left ventricular systolic function is normal.   LV end diastolic pressure is normal.   The left ventricular ejection fraction is 50-55% by visual estimate.   There is no aortic valve stenosis. Continue aggressive secondary prevention.  OK to switch to clopidogrel 75 mg daily after 1 month of Brilinta if there is difficulty obtaining the brand name medicine.   CT CORONARY MORPH W/CTA COR W/SCORE W/CA W/CM &/OR WO/CM  Addendum Date: 02/15/2021   ADDENDUM REPORT: 02/15/2021 12:54 CLINICAL DATA:  CP with normal stress test EXAM: Cardiac/Coronary  CTA TECHNIQUE: The patient was scanned on a [employer]. FINDINGS: A 120  kV prospective scan was triggered in the descending thoracic aorta at 111 HU's. Axial non-contrast 3 mm slices were carried out through the heart. The data set was analyzed on a dedicated work station and scored using the Agatson method. Gantry rotation speed was 250 msecs and collimation was .6 mm. No beta blockade and 0.8 mg of sl NTG was given. The 3D data set was reconstructed in 5% intervals of the 67-82 % of the R-R cycle. Diastolic phases were analyzed on a dedicated work station using MPR, MIP and VRT modes. The patient received 80 cc of contrast. Aorta:  Normal size.  No calcifications.  No dissection. Aortic Valve:  Trileaflet.  No calcifications. Coronary Arteries:  Normal coronary origin.  Right dominance. RCA is a large dominant artery that gives rise to PDA and PLA. In the mid portion of the artery calcified, minimal, non obstructive (0-25%) plaque is noted. Left main is a large artery that gives rise to LAD and LCX arteries. In the distal portion of this artery small, calcified, non obstructive (0-25%) plaque is noted. LAD is a large vessel. In the proximal portion of this artery complex plaque is present with intraplaque dye penetration.  This plaque cause severe stenosis (70-79%). Small D1 is noted distally. LCX is a non-dominant artery that trifurcates to moderate size OM1, moderate size OM2 and CX. OM2 has minimal non obstructive (0-25%) calcified plaque. Other findings: Normal pulmonary vein drainage into the left atrium. Right mid pulmonary vein noted - normal variant. Normal left atrial appendage without a thrombus. Normal size of the pulmonary artery. IMPRESSION: 1. Coronary calcium score of 92. 2. Normal coronary origin with right dominance. 3. CAD-RADS 4 Severe stenosis. (70-99% or > 50% left main). Cardiac catheterization or CT FFR is recommended. Consider symptom-guided anti-ischemic pharmacotherapy as well as risk factor modification per guideline directed care. Georgeanna Lea, MD Electronically Signed   By: [name] M.D.   On: 02/15/2021 12:54   Result Date: 02/15/2021 EXAM: OVER-READ INTERPRETATION  CT CHEST The following report is an over-read performed by radiologist Dr. Charlett Nose of [facility] on 02/15/2021. This over-read does not include interpretation of cardiac or coronary anatomy or pathology. The coronary CTA interpretation by the cardiologist is attached. COMPARISON:  01/21/2021 FINDINGS: Vascular: Heart is normal size.  Aorta normal caliber. Mediastinum/Nodes: No adenopathy. Small hiatal hernia again noted, unchanged. Lungs/Pleura: No confluent opacities or effusions. Upper Abdomen: Imaging into the upper abdomen demonstrates no acute findings. Musculoskeletal: Chest wall soft tissues are unremarkable. No acute bony abnormality. IMPRESSION: No acute extra cardiac abnormality. Electronically Signed: By: Charlett Nose M.D. On: 02/15/2021 09:55   CT CORONARY FRACTIONAL FLOW RESERVE DATA PREP  Result Date: 02/15/2021 EXAM: FFRCT ANALYSIS FINDINGS: FFRct analysis was performed on the original cardiac CT angiogram dataset. Diagrammatic representation of the FFRct analysis is provided in a separate  PDF document in PACS. This dictation was created using the PDF document and an interactive 3D model of the results. 3D model is not available in the EMR/PACS. Normal FFR range is >0.80. 1. Left Main: 0.98 2. LAD: Prox 0.96, distal to prox LAD lesion 0.64, distal LAD 0.59 3. LCX: Prox 0.97, mid 0.97, distal 0.96 4. RCA: Prox 0.98, mid 0.97, distal 0.96 IMPRESSION: Hemodynamically significant proximal LAD stenosis noted with FFR 0.64 Georgeanna Lea, MD Electronically Signed   By: [name] M.D.   On: 02/15/2021 13:58   ECHOCARDIOGRAM COMPLETE  Result Date: 02/16/2021    ECHOCARDIOGRAM REPORT  Patient Name:   Richard Spence Date of Exam: 02/16/2021 Medical Rec #:  [id number]        Height:       72.0 in Accession #:    [id number]       Weight:       229.2 lb Date of Birth:  1956-05-15        BSA:          2.258 m Patient Age:    65 years         BP:           124/78 mmHg Patient Gender: M                HR:           52 bpm. Exam Location:  Inpatient Procedure: 2D Echo, Color Doppler and Cardiac Doppler Indications:    R07.9* Chest pain, unspecified  History:        Patient has prior history of Echocardiogram examinations, most                 recent 05/08/2019. Cardiomyopathy, CAD, Arrythmias:Atrial                 Fibrillation; Risk Factors:Dyslipidemia.  Sonographer:    Eulah Pont RDCS Referring Phys: [phone] LINDSAY B ROBERTS IMPRESSIONS  1. Left ventricular ejection fraction, by estimation, is 55 to 60%. The left ventricle has normal function. The left ventricle has no regional wall motion abnormalities. Left ventricular diastolic parameters were normal.  2. Right ventricular systolic function is normal. The right ventricular size is normal. There is normal pulmonary artery systolic pressure.  3. Left atrial size was moderately dilated.  4. The mitral valve is normal in structure. Trivial mitral valve regurgitation.  5. The aortic valve is tricuspid. Aortic valve regurgitation is not visualized.  Mild aortic valve sclerosis is present, with no evidence of aortic valve stenosis. FINDINGS  Left Ventricle: Left ventricular ejection fraction, by estimation, is 55 to 60%. The left ventricle has normal function. The left ventricle has no regional wall motion abnormalities. The left ventricular internal cavity size was normal in size. There is  no left ventricular hypertrophy. Left ventricular diastolic parameters were normal. Normal left ventricular filling pressure. Right Ventricle: The right ventricular size is normal. No increase in right ventricular wall thickness. Right ventricular systolic function is normal. There is normal pulmonary artery systolic pressure. The tricuspid regurgitant velocity is 2.05 m/s, and  with an assumed right atrial pressure of 3 mmHg, the estimated right ventricular systolic pressure is 19.8 mmHg. Left Atrium: Left atrial size was moderately dilated. Right Atrium: Right atrial size was normal in size. Pericardium: There is no evidence of pericardial effusion. Mitral Valve: The mitral valve is normal in structure. Trivial mitral valve regurgitation. Tricuspid Valve: The tricuspid valve is normal in structure. Tricuspid valve regurgitation is trivial. Aortic Valve: The aortic valve is tricuspid. Aortic valve regurgitation is not visualized. Mild aortic valve sclerosis is present, with no evidence of aortic valve stenosis. Pulmonic Valve: The pulmonic valve was grossly normal. Pulmonic valve regurgitation is not visualized. Aorta: The aortic root is normal in size and structure. IAS/Shunts: No atrial level shunt detected by color flow Doppler.  LEFT VENTRICLE PLAX 2D LVIDd:         5.10 cm  Diastology LVIDs:         3.40 cm  LV e' medial:    9.06 cm/s LV PW:  1.10 cm  LV E/e' medial:  8.2 LV IVS:        0.90 cm  LV e' lateral:   8.62 cm/s LVOT diam:     2.10 cm  LV E/e' lateral: 8.6 LV SV:         97 LV SV Index:   43 LVOT Area:     3.46 cm  RIGHT VENTRICLE RV S prime:      11.10 cm/s TAPSE (M-mode): 2.2 cm LEFT ATRIUM             Index       RIGHT ATRIUM           Index LA diam:        4.30 cm 1.90 cm/m  RA Area:     13.20 cm LA Vol (A2C):   70.9 ml 31.41 ml/m RA Volume:   28.80 ml  12.76 ml/m LA Vol (A4C):   76.8 ml 34.02 ml/m LA Biplane Vol: 76.4 ml 33.84 ml/m  AORTIC VALVE LVOT Vmax:   123.00 cm/s LVOT Vmean:  77.600 cm/s LVOT VTI:    0.279 m  AORTA Ao Root diam: 3.40 cm Ao Asc diam:  3.70 cm MITRAL VALVE               TRICUSPID VALVE MV Area (PHT): 2.24 cm    TR Peak grad:   16.8 mmHg MV Decel Time: 338 msec    TR Vmax:        205.00 cm/s MV E velocity: 74.10 cm/s MV A velocity: 48.00 cm/s  SHUNTS MV E/A ratio:  1.54        Systemic VTI:  0.28 m                            Systemic Diam: 2.10 cm Rachelle Hora Croitoru MD Electronically signed by Thurmon Fair MD Signature Date/Time: 02/16/2021/10:37:19 AM    Final    Disposition   Pt is being discharged home today in good condition.  Follow-up Plans & Appointments     Follow-up Information     Georgeanna Lea, MD Follow up on 02/24/2021.   Specialty: Cardiology Why: at 9am for your follow up appt. Contact information: [address] [place] [address] [phone]                Discharge Instructions     AMB Referral to Cardiac Rehabilitation - Phase II   Complete by: As directed    Diagnosis:  Coronary Stents Stable Angina     After initial evaluation and assessments completed: Virtual Based Care may be provided alone or in conjunction with Phase 2 Cardiac Rehab based on patient barriers.: Yes   Call MD for:  difficulty breathing, headache or visual disturbances   Complete by: As directed    Call MD for:  persistant dizziness or light-headedness   Complete by: As directed    Call MD for:  redness, tenderness, or signs of infection (pain, swelling, redness, odor or green/yellow discharge around incision site)   Complete by: As directed    Diet - low sodium heart healthy   Complete by: As  directed    Discharge instructions   Complete by: As directed    Radial Site Care Refer to this sheet in the next few weeks. These instructions provide you with information on caring for yourself after your procedure. Your caregiver may also give you more specific instructions. Your treatment has been  planned according to current medical practices, but problems sometimes occur. Call your caregiver if you have any problems or questions after your procedure. HOME CARE INSTRUCTIONS You may shower the day after the procedure. Remove the bandage (dressing) and gently wash the site with plain soap and water. Gently pat the site dry.  Do not apply powder or lotion to the site.  Do not submerge the affected site in water for 3 to 5 days.  Inspect the site at least twice daily.  Do not flex or bend the affected arm for 24 hours.  No lifting over 5 pounds (2.3 kg) for 5 days after your procedure.  Do not drive home if you are discharged the same day of the procedure. Have someone else drive you.  You may drive 24 hours after the procedure unless otherwise instructed by your caregiver.  What to expect: Any bruising will usually fade within 1 to 2 weeks.  Blood that collects in the tissue (hematoma) may be painful to the touch. It should usually decrease in size and tenderness within 1 to 2 weeks.  SEEK IMMEDIATE MEDICAL CARE IF: You have unusual pain at the radial site.  You have redness, warmth, swelling, or pain at the radial site.  You have drainage (other than a small amount of blood on the dressing).  You have chills.  You have a fever or persistent symptoms for more than 72 hours.  You have a fever and your symptoms suddenly get worse.  Your arm becomes pale, cool, tingly, or numb.  You have heavy bleeding from the site. Hold pressure on the site.   PLEASE DO NOT MISS ANY DOSES OF YOUR PLAVIX!!!!! Also keep a log of you blood pressures and bring back to your follow up appt. Please call the  office with any questions.   Patients taking blood thinners should generally stay away from medicines like ibuprofen, Advil, Motrin, naproxen, and Aleve due to risk of stomach bleeding. You may take Tylenol as directed or talk to your primary doctor about alternatives.   PLEASE ENSURE THAT YOU DO NOT RUN OUT OF YOUR PLAVIX. This medication is very important to remain on for at least one year. IF you have issues obtaining this medication due to cost please CALL the office 3-5 business days prior to running out in order to prevent missing doses of this medication.   Increase activity slowly   Complete by: As directed        Discharge Medications   Allergies as of 02/17/2021   No Known Allergies      Medication List     TAKE these medications    apixaban 5 MG Tabs tablet Commonly known as: ELIQUIS Take 1 tablet (5 mg total) by mouth 2 (two) times daily.   aspirin 81 MG EC tablet Take 1 tablet (81 mg total) by mouth daily. Take for 30 days and then stop   carvedilol 6.25 MG tablet Commonly known as: COREG Take 1 tablet (6.25 mg total) by mouth 2 (two) times daily.   clopidogrel 75 MG tablet Commonly known as: PLAVIX Take 1 tablet (75 mg total) by mouth daily. Start taking on: February 18, 2021   diltiazem 120 MG 24 hr capsule Commonly known as: CARDIZEM CD Take 120 mg by mouth daily.   flecainide 50 MG tablet Commonly known as: TAMBOCOR TAKE 1 TABLET BY MOUTH TWICE (2) DAILY What changed: See the new instructions.   nitroGLYCERIN 0.4 MG SL tablet Commonly known as: NITROSTAT  Place 1 tablet (0.4 mg total) under the tongue every 5 (five) minutes as needed.   PARoxetine 40 MG tablet Commonly known as: PAXIL Take 40 mg by mouth daily.   pregabalin 75 MG capsule Commonly known as: LYRICA Take 1 capsule by mouth 2 (two) times daily.   ranolazine 500 MG 12 hr tablet Commonly known as: RANEXA Take 1 tablet (500 mg total) by mouth 2 (two) times daily.    rosuvastatin 20 MG tablet Commonly known as: CRESTOR TAKE ONE TABLET BY MOUTH EVERY DAY   vitamin B-12 1000 MCG tablet Commonly known as: CYANOCOBALAMIN Take 1,000 mcg by mouth daily.   Vitamin D3 125 MCG (5000 UT) Caps Take 5,000 Units by mouth daily.         Outstanding Labs/Studies   N/a   Duration of Discharge Encounter   Greater than 30 minutes including physician time.  Signed, Laverda Page, NP 02/17/2021, 9:57 AM   Agree with note by Laverda Page NP-C  Patient mated with unstable angina.  CT FFR proximally was positive.  Cardiac catheterization performed yesterday back to [place] revealed high-grade disease by IVUS imaging.  He underwent PCI and drug-eluting stenting with a 38 mm drug-eluting stent postdilated to 3.8 mm.  He had no other significant CAD and normal LV function.  He was originally placed on Brilinta which will be switched to Plavix because of being on Eliquis for PAF.  His right radial puncture site is stable.  He is exam otherwise is benign.  He is asymptomatic.  Okay for discharge home, follow-up with Dr. Bing Matter.  Runell Gess, M.D., FACP, [facility], [facility] [address] [place], [place]  [address]  [phone] 02/17/2021 10:09 AM

## 2021-02-17 NOTE — Progress Notes (Signed)
Patient given discharge instructions and stated understanding. 

## 2021-02-19 ENCOUNTER — Telehealth (HOSPITAL_COMMUNITY): Payer: Self-pay

## 2021-02-19 NOTE — Telephone Encounter (Signed)
Per phase I cardiac rehab, fax cardiac rehab referral to [place] [facility]. 

## 2021-02-24 ENCOUNTER — Ambulatory Visit: Payer: Medicare HMO | Admitting: Cardiology

## 2021-02-24 ENCOUNTER — Encounter: Payer: Self-pay | Admitting: Cardiology

## 2021-02-24 ENCOUNTER — Other Ambulatory Visit: Payer: Self-pay

## 2021-02-24 VITALS — BP 100/66 | HR 52 | Ht 72.0 in | Wt 226.8 lb

## 2021-02-24 DIAGNOSIS — I480 Paroxysmal atrial fibrillation: Secondary | ICD-10-CM

## 2021-02-24 DIAGNOSIS — R079 Chest pain, unspecified: Secondary | ICD-10-CM

## 2021-02-24 DIAGNOSIS — E782 Mixed hyperlipidemia: Secondary | ICD-10-CM

## 2021-02-24 DIAGNOSIS — R7303 Prediabetes: Secondary | ICD-10-CM

## 2021-02-24 NOTE — Progress Notes (Signed)
Cardiology Office Note:    Date:  02/24/2021   ID:  Richard Spence, DOB 01/02/1956, MRN [id number]  PCP:  [name], MD  Cardiologist:  [name], MD    Referring MD: [name], MD   Chief Complaint  Patient presents with   chest pain after stent 1 week ago   I am doing well  History of Present Illness:    Richard Spence is a 65 y.o. male with past medical history significant for paroxysmal atrial fibrillation, he is anticoagulated, recent PTCA and stenting of the proximal LAD for ulcerated lesion.  His story is quite interesting.  Year ago he got cardiac catheterization he was identified to have about 45% stenosis in proximal LAD.  About 3 or 4 weeks ago he presented to [facility] because of episode of chest pain he ruled out for myocardial infarction, stress test being done which was negative but after that he felt very poorly and I elected to do coronary CT angio.  Coronary CT angio showed ulcerated lesion in the proximal portion of LAD.  He was sent to the cardiac Cath Lab the ulceration of the lesion was confirmed with intraoperative coronary ultrasound sent drug-eluting stent was placed in the proximal portion of the LAD.  He comes today to my office for follow-up.  Overall she feels better it still is very difficult to get started from him interestingly he describes having some chest pain when he gets up meaning when he bends and pick up something and get up then he will develop some uneasy sensation in the chest at the same time walking climbing stairs moving around does not bring any pain.  He is on dual antiplatelet therapy as well as on Eliquis which should continue for at least a month.  Past Medical History:  Diagnosis Date   Abnormal stress test 10/21/2019   Acute pain of left knee 04/14/2020   Altered mental status 10/25/2018   Atrial fibrillation with rapid ventricular response (HCC) 10/08/2018   Atrial fibrillation with rapid ventricular response  (HCC) 10/08/2018   Cardiomyopathy (HCC) 10/25/2018   EF 25-30% on ST. 40% on ECHO 2020.  LVH   Chronic pain syndrome 04/27/2020   Coronary artery disease of native artery of native heart with stable angina pectoris (HCC) 12/02/2019   Formatting of this note might be different from the original. 10/2019. Cath at [facility] LAD 45%. EF 55-60%   Degeneration of lumbar intervertebral disc 07/30/2015   DOE (dyspnea on exertion) 10/16/2018   Facet arthropathy, cervical 01/30/2020   Formatting of this note might be different from the original. Added automatically from request for surgery [id number]  Formatting of this note might be different from the original. Added automatically from request for surgery [id number]   Fatigue 06/06/2019   Hearing loss 10/15/2018   High risk medication use 07/30/2015   Long-term use of aspirin therapy 10/15/2018   Malaise and fatigue 10/25/2018   Mild cognitive impairment 12/10/2018   Formatting of this note might be different from the original. 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 Added automatically  from request for surgery [id number]   Paroxysmal atrial fibrillation (HCC) 05/06/2019   Permanent atrial fibrillation (HCC) 10/08/2018   Persistent atrial fibrillation, status post cardioversion in July 2020 about flip back to atrial fibrillation a few weeks later 01/21/2019   Postlaminectomy syndrome of lumbar region 04/27/2020   Postural dizziness with presyncope 10/25/2018   Prediabetes 07/30/2015   Right shoulder injury 10/08/2018   S/P arthroscopy of right shoulder 03/12/2019   S/P rotator cuff repair 03/12/2019    SI (sacroiliac) pain 07/27/2020   Tear of right supraspinatus tendon 10/25/2018   Tremor 07/30/2015   Vitamin B 12 deficiency 07/30/2015   White matter disease, unspecified 11/19/2018   Noted on MRI of brain 2020. Discussed with pt and wife.    Past Surgical History:  Procedure Laterality Date   BACK SURGERY     CHOLECYSTECTOMY     CORONARY STENT INTERVENTION N/A 02/16/2021   Procedure: CORONARY STENT INTERVENTION;  Surgeon: Corky Crafts, MD;  Location: [facility] INVASIVE CV LAB;  Service: Cardiovascular;  Laterality: N/A;   HERNIA REPAIR  2009   INTRAVASCULAR ULTRASOUND/IVUS N/A 02/16/2021   Procedure: Intravascular Ultrasound/IVUS;  Surgeon: Corky Crafts, MD;  Location: [facility] INVASIVE CV LAB;  Service: Cardiovascular;  Laterality: N/A;   LEFT HEART CATH AND CORONARY ANGIOGRAPHY N/A 10/25/2019   Procedure: LEFT HEART CATH AND CORONARY ANGIOGRAPHY;  Surgeon: Swaziland, Peter M, MD;  Location: [facility] INVASIVE CV LAB;  Service: Cardiovascular;  Laterality: N/A;   LEFT HEART CATH AND CORONARY ANGIOGRAPHY N/A 02/16/2021   Procedure: LEFT HEART CATH AND CORONARY ANGIOGRAPHY;  Surgeon: Corky Crafts, MD;  Location: [facility] INVASIVE CV LAB;  Service: Cardiovascular;  Laterality: N/A;   ROTATOR CUFF REPAIR  03/07/2019   SHOULDER SURGERY Left 12/2010   Rotater cuff   SPINAL FUSION  1998   Dr Nadene Rubins   [facility] SURGERY  2001   Spinal hardware removal by Dr. Lilian Kapur    Current Medications: Current Meds  Medication Sig   apixaban (ELIQUIS) 5 MG TABS tablet Take 1 tablet (5 mg total) by mouth 2 (two) times daily.   aspirin EC 81 MG EC tablet Take 1 tablet (81 mg total) by mouth daily. Take for 30 days and then stop   carvedilol (COREG) 6.25 MG tablet Take 1 tablet (6.25 mg total) by mouth 2 (two) times daily.   Cholecalciferol (VITAMIN D3) 125 MCG (5000 UT) CAPS Take 5,000 Units by mouth daily.    clopidogrel (PLAVIX) 75 MG tablet Take 1 tablet (75 mg total) by mouth daily.   diltiazem (CARDIZEM CD) 120  MG 24 hr capsule Take 120 mg by mouth daily.    flecainide (TAMBOCOR) 50 MG tablet TAKE 1 TABLET BY MOUTH TWICE (2) DAILY (Patient taking differently: Take 50 mg by mouth 2 (two) times daily. TAKE 1 TABLET BY MOUTH TWICE (2) DAILY)   nitroGLYCERIN (NITROSTAT) 0.4 MG SL tablet Place 1 tablet (0.4 mg total) under the tongue every 5 (five) minutes as needed. (Patient taking differently: Place 0.4 mg under the tongue every 5 (five) minutes as needed for chest pain.)   PARoxetine (PAXIL) 40 MG tablet Take 40 mg by mouth daily.    pregabalin (LYRICA) 75 MG capsule Take 1 capsule by mouth 2 (two) times daily.   rosuvastatin (CRESTOR) 20 MG tablet TAKE ONE TABLET BY MOUTH EVERY DAY (Patient taking differently: Take 20 mg by mouth daily.)   vitamin B-12 (CYANOCOBALAMIN) 1000 MCG tablet Take 1,000 mcg by mouth daily.  Allergies:   Patient has no known allergies.   Social History   Socioeconomic History   Marital status: Married    Spouse name: Not on file   Number of children: Not on file   Years of education: Not on file   Highest education level: Not on file  Occupational History   Not on file  Tobacco Use   Smoking status: Never   Smokeless tobacco: Never  Vaping Use   Vaping Use: Never used  Substance and Sexual Activity   Alcohol use: Yes    Comment: once in a while   Drug use: Not Currently   Sexual activity: Not on file  Other Topics Concern   Not on file  Social History Narrative   Not on file   Social Determinants of Health   Financial Resource Strain: Not on file  Food Insecurity: Not on file  Transportation Needs: Not on file  Physical Activity: Not on file  Stress: Not on file  Social Connections: Not on file     Family History: The patient's family history includes Breast cancer in his mother; Dementia in his maternal grandmother and mother; Hypertension in his father and mother; Multiple sclerosis in his son; Prostate cancer in his maternal grandfather. ROS:    Please see the history of present illness.    All 14 point review of systems negative except as described per history of present illness  EKGs/Labs/Other Studies Reviewed:      Recent Labs: 02/17/2021: BUN 14; Creatinine, Ser 1.07; Hemoglobin 14.9; Platelets 195; Potassium 3.8; Sodium 141  Recent Lipid Panel    Component Value Date/Time   CHOL 134 02/16/2021 0317   CHOL 121 03/06/2020 0923   TRIG 87 02/16/2021 0317   HDL 58 02/16/2021 0317   HDL 51 03/06/2020 0923   CHOLHDL 2.3 02/16/2021 0317   VLDL 17 02/16/2021 0317   LDLCALC 59 02/16/2021 0317   LDLCALC 53 03/06/2020 0923    Physical Exam:    VS:  BP 100/66 (BP Location: Left Arm, Patient Position: Sitting)   Pulse (!) 52   Ht 6' (1.829 m)   Wt 226 lb 12.8 oz (102.9 kg)   SpO2 95%   BMI 30.76 kg/m     Wt Readings from Last 3 Encounters:  02/24/21 226 lb 12.8 oz (102.9 kg)  02/16/21 229 lb 3.2 oz (104 kg)  02/01/21 230 lb 12.8 oz (104.7 kg)     GEN:  Well nourished, well developed in no acute distress HEENT: Normal NECK: No JVD; No carotid bruits LYMPHATICS: No lymphadenopathy CARDIAC: RRR, no murmurs, no rubs, no gallops RESPIRATORY:  Clear to auscultation without rales, wheezing or rhonchi  ABDOMEN: Soft, non-tender, non-distended MUSCULOSKELETAL:  No edema; No deformity  SKIN: Warm and dry LOWER EXTREMITIES: no swelling NEUROLOGIC:  Alert and oriented x 3 PSYCHIATRIC:  Normal affect   ASSESSMENT:    1. Chest pain of uncertain etiology   2. Paroxysmal atrial fibrillation (HCC)   3. Prediabetes   4. Mixed hyperlipidemia    PLAN:    In order of problems listed above:  Coronary artery disease status post PTCA and stenting of the proximal LAD with drug-eluting stent.  He did have ulcerated lesion.  I described to them procedure I showed him pictures they are very satisfied with the care they got him on the scan.  Will continue triple therapy for at least a month.  I will see him then and then decide  about discontinuation probably aspirin.  The key of course will be risk factors modifications. Paroxysmal atrial fibrillation he is anticoagulated which I will continue. Prediabetes.  I encouraged him to be more active he will be referred to cardiac rehab hopefully that will help avoid diabetes. Mixed dyslipidemia: I did review his K PN which show me his LDL 59 HDL 58.  We will recheck this value I would like to see his LDL less than 50.   Medication Adjustments/Labs and Tests Ordered: Current medicines are reviewed at length with the patient today.  Concerns regarding medicines are outlined above.  Orders Placed This Encounter  Procedures   EKG 12-Lead   Medication changes: No orders of the defined types were placed in this encounter.   Signed, Georgeanna Lea, MD, [facility] 02/24/2021 9:48 AM    [facility]

## 2021-02-24 NOTE — Patient Instructions (Signed)
Medication Instructions:  Your physician recommends that you continue on your current medications as directed. Please refer to the Current Medication list given to you today.  *If you need a refill on your cardiac medications before your next appointment, please call your pharmacy*   Lab Work: None If you have labs (blood work) drawn today and your tests are completely normal, you will receive your results only by: MyChart Message (if you have MyChart) OR A paper copy in the mail If you have any lab test that is abnormal or we need to change your treatment, we will call you to review the results.   Testing/Procedures: None   Follow-Up: At [facility], you and your health needs are our priority.  As part of our continuing mission to provide you with exceptional heart care, we have created designated Provider Care Teams.  These Care Teams include your primary Cardiologist (physician) and Advanced Practice Providers (APPs -  Physician Assistants and Nurse Practitioners) who all work together to provide you with the care you need, when you need it.  We recommend signing up for the patient portal called "MyChart".  Sign up information is provided on this After Visit Summary.  MyChart is used to connect with patients for Virtual Visits (Telemedicine).  Patients are able to view lab/test results, encounter notes, upcoming appointments, etc.  Non-urgent messages can be sent to your provider as well.   To learn more about what you can do with MyChart, go to [email or web address].    Your next appointment:   1 month(s)  The format for your next appointment:   In Person  Provider:   Robert Krasowski, MD   Other Instructions   

## 2021-03-08 HISTORY — PX: CATARACT EXTRACTION, BILATERAL: SHX1313

## 2021-04-05 ENCOUNTER — Other Ambulatory Visit: Payer: Self-pay

## 2021-04-05 ENCOUNTER — Ambulatory Visit: Payer: Medicare HMO | Admitting: Cardiology

## 2021-04-05 ENCOUNTER — Encounter: Payer: Self-pay | Admitting: Cardiology

## 2021-04-05 VITALS — BP 118/72 | HR 56 | Ht 72.0 in | Wt 230.8 lb

## 2021-04-05 DIAGNOSIS — I2510 Atherosclerotic heart disease of native coronary artery without angina pectoris: Secondary | ICD-10-CM

## 2021-04-05 DIAGNOSIS — R0609 Other forms of dyspnea: Secondary | ICD-10-CM | POA: Diagnosis not present

## 2021-04-05 DIAGNOSIS — I480 Paroxysmal atrial fibrillation: Secondary | ICD-10-CM | POA: Diagnosis not present

## 2021-04-05 DIAGNOSIS — I420 Dilated cardiomyopathy: Secondary | ICD-10-CM

## 2021-04-05 MED ORDER — CARVEDILOL 3.125 MG PO TABS: 3.1250 mg | ORAL_TABLET | Freq: Two times a day (BID) | ORAL | 2 refills | Status: DC

## 2021-04-05 NOTE — Progress Notes (Signed)
Cardiology Office Note:    Date:  04/05/2021   ID:  Richard Spence, DOB 12-Sep-1955, MRN [id number]  PCP:  [name], MD  Cardiologist:  [name], MD    Referring MD: [name], MD   Chief Complaint  Patient presents with   Medication Management  I am doing fine but I am weak tired and exhausted and also short of breath  History of Present Illness:    Richard Spence is a 65 y.o. male  with past medical history significant for paroxysmal atrial fibrillation, he is anticoagulated, recent PTCA and stenting of the proximal LAD for ulcerated lesion.  His story is quite interesting.  Year ago he got cardiac catheterization he was identified to have about 45% stenosis in proximal LAD.  About 3 or 4 weeks ago he presented to [facility] because of episode of chest pain he ruled out for myocardial infarction, stress test being done which was negative but after that he felt very poorly and I elected to do coronary CT angio.  Coronary CT angio showed ulcerated lesion in the proximal portion of LAD.  He was sent to the cardiac Cath Lab the ulceration of the lesion was confirmed with intraoperative coronary ultrasound sent drug-eluting stent was placed in the proximal portion of the LAD.  He comes today to my office for follow-up.   He comes today 2 months follow-up overall seems to be doing well except for the fact that he is weak tired and exhausted.  Denies have any chest pain tightness squeezing pressure burning chest.  He participated in rehab and he enjoyed this quite nicely.  Past Medical History:  Diagnosis Date   Abnormal stress test 10/21/2019   Acute pain of left knee 04/14/2020   Altered mental status 10/25/2018   Atrial fibrillation with rapid ventricular response (HCC) 10/08/2018   Atrial fibrillation with rapid ventricular response (HCC) 10/08/2018   Cardiomyopathy (HCC) 10/25/2018   EF 25-30% on ST. 40% on ECHO 2020.  LVH   Chronic pain syndrome 04/27/2020    Coronary artery disease of native artery of native heart with stable angina pectoris (HCC) 12/02/2019   Formatting of this note might be different from the original. 10/2019. Cath at [facility] LAD 45%. EF 55-60%   Degeneration of lumbar intervertebral disc 07/30/2015   DOE (dyspnea on exertion) 10/16/2018   Facet arthropathy, cervical 01/30/2020   Formatting of this note might be different from the original. Added automatically from request for surgery [id number]  Formatting of this note might be different from the original. Added automatically from request for surgery [id number]   Fatigue 06/06/2019   Hearing loss 10/15/2018   High risk medication use 07/30/2015   Long-term use of aspirin therapy 10/15/2018   Malaise and fatigue 10/25/2018   Mild cognitive impairment 12/10/2018   Formatting of this note might be different from the original. 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 Added automatically from request for surgery [id number]   Paroxysmal atrial fibrillation (HCC) 05/06/2019   Permanent atrial fibrillation (HCC) 10/08/2018   Persistent atrial fibrillation, status post cardioversion  in July 2020 about flip back to atrial fibrillation a few weeks later 01/21/2019   Postlaminectomy syndrome of lumbar region 04/27/2020   Postural dizziness with presyncope 10/25/2018   Prediabetes 07/30/2015   Right shoulder injury 10/08/2018   S/P arthroscopy of right shoulder 03/12/2019   S/P rotator cuff repair 03/12/2019   SI (sacroiliac) pain 07/27/2020   Tear of right supraspinatus tendon 10/25/2018   Tremor 07/30/2015   Vitamin B 12 deficiency  07/30/2015   White matter disease, unspecified 11/19/2018   Noted on MRI of brain 2020. Discussed with pt and wife.    Past Surgical History:  Procedure Laterality Date   BACK SURGERY     CATARACT EXTRACTION, BILATERAL Bilateral 03/08/2021   03/22/2021   CHOLECYSTECTOMY     CORONARY STENT INTERVENTION N/A 02/16/2021   Procedure: CORONARY STENT INTERVENTION;  Surgeon: Corky Crafts, MD;  Location: [facility] INVASIVE CV LAB;  Service: Cardiovascular;  Laterality: N/A;   HERNIA REPAIR  2009   INTRAVASCULAR ULTRASOUND/IVUS N/A 02/16/2021   Procedure: Intravascular Ultrasound/IVUS;  Surgeon: Corky Crafts, MD;  Location: [facility] INVASIVE CV LAB;  Service: Cardiovascular;  Laterality: N/A;   LEFT HEART CATH AND CORONARY ANGIOGRAPHY N/A 10/25/2019   Procedure: LEFT HEART CATH AND CORONARY ANGIOGRAPHY;  Surgeon: Swazil[place], Peter M, MD;  Location: [facility] INVASIVE CV LAB;  Service: Cardiovascular;  Laterality: N/A;   LEFT HEART CATH AND CORONARY ANGIOGRAPHY N/A 02/16/2021   Procedure: LEFT HEART CATH AND CORONARY ANGIOGRAPHY;  Surgeon: Corky Crafts, MD;  Location: [facility] INVASIVE CV LAB;  Service: Cardiovascular;  Laterality: N/A;   ROTATOR CUFF REPAIR  03/07/2019   SHOULDER SURGERY Left 12/2010   Rotater cuff   SPINAL FUSION  1998   Dr Nadene Rubins   [facility] SURGERY  2001   Spinal hardware removal by Dr. Lilian Kapur    Current Medications: Current Meds  Medication Sig   apixaban (ELIQUIS) 5 MG TABS tablet Take 1 tablet (5 mg total) by mouth 2 (two) times daily.   aspirin EC 81 MG EC tablet Take 1 tablet (81 mg total) by mouth daily. Take for 30 days and then stop   carvedilol (COREG) 6.25 MG tablet Take 1 tablet (6.25 mg total) by mouth 2 (two) times daily.   Cholecalciferol (VITAMIN D3) 125 MCG (5000 UT) CAPS Take 5,000 Units by mouth daily.    clopidogrel (PLAVIX) 75 MG tablet Take 1 tablet (75 mg total) by mouth daily.   diltiazem (CARDIZEM CD) 120 MG 24 hr capsule Take 120 mg by mouth daily.     flecainide (TAMBOCOR) 50 MG tablet TAKE 1 TABLET BY MOUTH TWICE (2) DAILY (Patient taking differently: Take 50 mg by mouth 2 (two) times daily. TAKE 1 TABLET BY MOUTH TWICE (2) DAILY)   nitroGLYCERIN (NITROSTAT) 0.4 MG SL tablet Place 1 tablet (0.4 mg total) under the tongue every 5 (five) minutes as needed. (Patient taking differently: Place 0.4 mg under the tongue every 5 (five) minutes as needed for chest pain.)   PARoxetine (PAXIL) 40 MG tablet Take 40 mg by mouth daily.    pregabalin (LYRICA) 75 MG capsule Take 1 capsule by mouth 2 (two) times daily.   rosuvastatin (CRESTOR) 20 MG tablet TAKE ONE TABLET BY MOUTH EVERY DAY (Patient taking differently: Take 20 mg by mouth daily.)   vitamin B-12 (CYANOCOBALAMIN) 1000 MCG tablet Take 1,000 mcg by mouth daily.     Allergies:   Patient has no known allergies.   Social History   Socioeconomic History  Marital status: Married    Spouse name: Not on file   Number of children: Not on file   Years of education: Not on file   Highest education level: Not on file  Occupational History   Not on file  Tobacco Use   Smoking status: Never   Smokeless tobacco: Never  Vaping Use   Vaping Use: Never used  Substance and Sexual Activity   Alcohol use: Yes    Comment: once in a while   Drug use: Not Currently   Sexual activity: Not on file  Other Topics Concern   Not on file  Social History Narrative   Not on file   Social Determinants of Health   Financial Resource Strain: Not on file  Food Insecurity: Not on file  Transportation Needs: Not on file  Physical Activity: Not on file  Stress: Not on file  Social Connections: Not on file     Family History: The patient's family history includes Breast cancer in his mother; Dementia in his maternal grandmother and mother; Hypertension in his father and mother; Multiple sclerosis in his son; Prostate cancer in his maternal grandfather. ROS:   Please see the history of present illness.     All 14 point review of systems negative except as described per history of present illness  EKGs/Labs/Other Studies Reviewed:      Recent Labs: 02/17/2021: BUN 14; Creatinine, Ser 1.07; Hemoglobin 14.9; Platelets 195; Potassium 3.8; Sodium 141  Recent Lipid Panel    Component Value Date/Time   CHOL 134 02/16/2021 0317   CHOL 121 03/06/2020 0923   TRIG 87 02/16/2021 0317   HDL 58 02/16/2021 0317   HDL 51 03/06/2020 0923   CHOLHDL 2.3 02/16/2021 0317   VLDL 17 02/16/2021 0317   LDLCALC 59 02/16/2021 0317   LDLCALC 53 03/06/2020 0923    Physical Exam:    VS:  BP 118/72 (BP Location: Left Arm, Patient Position: Sitting)   Pulse (!) 56   Ht 6' (1.829 m)   Wt 230 lb 12.8 oz (104.7 kg)   SpO2 97%   BMI 31.30 kg/m     Wt Readings from Last 3 Encounters:  04/05/21 230 lb 12.8 oz (104.7 kg)  02/24/21 226 lb 12.8 oz (102.9 kg)  02/16/21 229 lb 3.2 oz (104 kg)     GEN:  Well nourished, well developed in no acute distress HEENT: Normal NECK: No JVD; No carotid bruits LYMPHATICS: No lymphadenopathy CARDIAC: RRR, no murmurs, no rubs, no gallops RESPIRATORY:  Clear to auscultation without rales, wheezing or rhonchi  ABDOMEN: Soft, non-tender, non-distended MUSCULOSKELETAL:  No edema; No deformity  SKIN: Warm and dry LOWER EXTREMITIES: no swelling NEUROLOGIC:  Alert and oriented x 3 PSYCHIATRIC:  Normal affect   ASSESSMENT:    1. Paroxysmal atrial fibrillation (HCC)   2. Coronary artery disease involving native coronary artery of native heart without angina pectoris   3. Dilated cardiomyopathy (HCC)   4. DOE (dyspnea on exertion)    PLAN:    In order of problems listed above:  Profound fatigue tiredness.  His rhythm is normal actually he is bradycardic I will cut down his carvedilol to only 3.125 twice daily hopefully that will improve his symptomatology.  I will also ask him to have complete metabolic panel and CBC done today. Coronary artery disease status post PTCA  and stenting of the proximal ID he is on triple therapy right now.  We will discontinue his aspirin. History of cardiomyopathy.  His  ejection fraction now normalized.  He is not still on flecainide which making obviously concerned.  I will talk to our EP colleagues regarding that issue.  We will continue anticoagulation. Dyspnea on exertion, we will do the above mentioning adjustment and see if that helps. Paroxysmal atrial fibrillation, maintaining sinus rhythm.  Continue present management.  We will talk to EP about continuation of flecainide.   Medication Adjustments/Labs and Tests Ordered: Current medicines are reviewed at length with the patient today.  Concerns regarding medicines are outlined above.  No orders of the defined types were placed in this encounter.  Medication changes: No orders of the defined types were placed in this encounter.   Signed, Georgeanna Lea, MD, [facility] 04/05/2021 1:06 PM    [facility]

## 2021-04-05 NOTE — Patient Instructions (Signed)
Medication Instructions:  Your physician has recommended you make the following change in your medication:   DECREASE: Carvedilol 3.125 mg twice daily  *If you need a refill on your cardiac medications before your next appointment, please call your pharmacy*   Lab Work: Your physician recommends that you return for lab work today: CMP, CBC  If you have labs (blood work) drawn today and your tests are completely normal, you will receive your results only by: MyChart Message (if you have MyChart) OR A paper copy in the mail If you have any lab test that is abnormal or we need to change your treatment, we will call you to review the results.   Testing/Procedures: None   Follow-Up: At [facility], you and your health needs are our priority.  As part of our continuing mission to provide you with exceptional heart care, we have created designated Provider Care Teams.  These Care Teams include your primary Cardiologist (physician) and Advanced Practice Providers (APPs -  Physician Assistants and Nurse Practitioners) who all work together to provide you with the care you need, when you need it.  We recommend signing up for the patient portal called "MyChart".  Sign up information is provided on this After Visit Summary.  MyChart is used to connect with patients for Virtual Visits (Telemedicine).  Patients are able to view lab/test results, encounter notes, upcoming appointments, etc.  Non-urgent messages can be sent to your provider as well.   To learn more about what you can do with MyChart, go to [email or web address].    Your next appointment:   6 week(s)  The format for your next appointment:   In Person  Provider:   [name], MD   Other Instructions

## 2021-04-06 LAB — COMPREHENSIVE METABOLIC PANEL
ALT: 22 IU/L (ref 0–44)
AST: 22 IU/L (ref 0–40)
Albumin/Globulin Ratio: 1.9 (ref 1.2–2.2)
Albumin: 4.5 g/dL (ref 3.8–4.8)
Alkaline Phosphatase: 71 IU/L (ref 44–121)
BUN/Creatinine Ratio: 24 (ref 10–24)
BUN: 23 mg/dL (ref 8–27)
Bilirubin Total: 0.6 mg/dL (ref 0.0–1.2)
CO2: 24 mmol/L (ref 20–29)
Calcium: 9.4 mg/dL (ref 8.6–10.2)
Chloride: 105 mmol/L (ref 96–106)
Creatinine, Ser: 0.95 mg/dL (ref 0.76–1.27)
Globulin, Total: 2.4 g/dL (ref 1.5–4.5)
Glucose: 112 mg/dL — ABNORMAL HIGH (ref 70–99)
Potassium: 4.4 mmol/L (ref 3.5–5.2)
Sodium: 144 mmol/L (ref 134–144)
Total Protein: 6.9 g/dL (ref 6.0–8.5)
eGFR: 88 mL/min/{1.73_m2} (ref >59)

## 2021-04-06 LAB — CBC
Hematocrit: 47.8 % (ref 37.5–51.0)
Hemoglobin: 16.3 g/dL (ref 13.0–17.7)
MCH: 31.1 pg (ref 26.6–33.0)
MCHC: 34.1 g/dL (ref 31.5–35.7)
MCV: 91 fL (ref 79–97)
Platelets: 241 10*3/uL (ref 150–450)
RBC: 5.24 x10E6/uL (ref 4.14–5.80)
RDW: 13 % (ref 11.6–15.4)
WBC: 5.8 10*3/uL (ref 3.4–10.8)

## 2021-05-19 ENCOUNTER — Ambulatory Visit (INDEPENDENT_AMBULATORY_CARE_PROVIDER_SITE_OTHER): Payer: Medicare HMO | Admitting: Cardiology

## 2021-05-19 ENCOUNTER — Other Ambulatory Visit: Payer: Self-pay

## 2021-05-19 ENCOUNTER — Encounter: Payer: Self-pay | Admitting: Cardiology

## 2021-05-19 VITALS — BP 124/74 | HR 78 | Ht 72.0 in | Wt 230.8 lb

## 2021-05-19 DIAGNOSIS — R5381 Other malaise: Secondary | ICD-10-CM | POA: Diagnosis not present

## 2021-05-19 DIAGNOSIS — R0609 Other forms of dyspnea: Secondary | ICD-10-CM | POA: Diagnosis not present

## 2021-05-19 DIAGNOSIS — I2510 Atherosclerotic heart disease of native coronary artery without angina pectoris: Secondary | ICD-10-CM

## 2021-05-19 DIAGNOSIS — I480 Paroxysmal atrial fibrillation: Secondary | ICD-10-CM | POA: Diagnosis not present

## 2021-05-19 DIAGNOSIS — R5383 Other fatigue: Secondary | ICD-10-CM

## 2021-05-19 NOTE — Patient Instructions (Signed)
Medication Instructions:  Your physician recommends that you continue on your current medications as directed. Please refer to the Current Medication list given to you today.  *If you need a refill on your cardiac medications before your next appointment, please call your pharmacy*   Lab Work: Your physician recommends that you return for lab work today: vitamin b12, vitamin d, tsh  If you have labs (blood work) drawn today and your tests are completely normal, you will receive your results only by: MyChart Message (if you have MyChart) OR A paper copy in the mail If you have any lab test that is abnormal or we need to change your treatment, we will call you to review the results.   Testing/Procedures: none   Follow-Up: At [facility], you and your health needs are our priority.  As part of our continuing mission to provide you with exceptional heart care, we have created designated Provider Care Teams.  These Care Teams include your primary Cardiologist (physician) and Advanced Practice Providers (APPs -  Physician Assistants and Nurse Practitioners) who all work together to provide you with the care you need, when you need it.  We recommend signing up for the patient portal called "MyChart".  Sign up information is provided on this After Visit Summary.  MyChart is used to connect with patients for Virtual Visits (Telemedicine).  Patients are able to view lab/test results, encounter notes, upcoming appointments, etc.  Non-urgent messages can be sent to your provider as well.   To learn more about what you can do with MyChart, go to [email or web address].    Your next appointment:   5 month(s)  The format for your next appointment:   In Person  Provider:   [name], MD    Other Instructions

## 2021-05-19 NOTE — Addendum Note (Signed)
Addended by: Hazle Quant on: 05/19/2021 02:23 PM   Modules accepted: Orders

## 2021-05-19 NOTE — Progress Notes (Signed)
Cardiology Office Note:    Date:  05/19/2021   ID:  Richard Spence, DOB 09-14-1955, MRN [id number]  PCP:  [name], MD  Cardiologist:  [name], MD    Referring MD: [name], MD   Chief Complaint  Patient presents with   multiple concerns   Am still weak and tired  History of Present Illness:    Richard Spence is a 65 y.o. male   with past medical history significant for paroxysmal atrial fibrillation, he is anticoagulated, recent PTCA and stenting of the proximal LAD for ulcerated lesion.  His story is quite interesting.  Year ago he got cardiac catheterization he was identified to have about 45% stenosis in proximal LAD.  About 3 or 4 weeks ago he presented to [facility] because of episode of chest pain he ruled out for myocardial infarction, stress test being done which was negative but after that he felt very poorly and I elected to do coronary CT angio.  Coronary CT angio showed ulcerated lesion in the proximal portion of LAD.  He was sent to the cardiac Cath Lab the ulceration of the lesion was confirmed with intraoperative coronary ultrasound sent drug-eluting stent was placed in the proximal portion of the LAD.  He comes today to my office for follow-up.  Overall he seems to be doing quite well.  He denies have any chest pain tightness squeezing pressure burning chest there is no shortness of breath concern right now is fatigue and tiredness he goes to rehab cardiac rehab and he enjoyed this tremendously doing well over there but the problem is fatigue and tiredness.  I did ask him to cut down carvedilol and a half he said he may feel a little better but not convincingly better.  Past Medical History:  Diagnosis Date   Abnormal stress test 10/21/2019   Acute pain of left knee 04/14/2020   Altered mental status 10/25/2018   Atrial fibrillation with rapid ventricular response (HCC) 10/08/2018   Atrial fibrillation with rapid ventricular response (HCC)  10/08/2018   Cardiomyopathy (HCC) 10/25/2018   EF 25-30% on ST. 40% on ECHO 2020.  LVH   Chronic pain syndrome 04/27/2020   Coronary artery disease of native artery of native heart with stable angina pectoris (HCC) 12/02/2019   Formatting of this note might be different from the original. 10/2019. Cath at [facility] LAD 45%. EF 55-60%   Degeneration of lumbar intervertebral disc 07/30/2015   DOE (dyspnea on exertion) 10/16/2018   Facet arthropathy, cervical 01/30/2020   Formatting of this note might be different from the original. Added automatically from request for surgery [id number]  Formatting of this note might be different from the original. Added automatically from request for surgery [id number]   Fatigue 06/06/2019   Hearing loss 10/15/2018   High risk medication use 07/30/2015   Long-term use of aspirin therapy 10/15/2018   Malaise and fatigue 10/25/2018   Mild cognitive impairment 12/10/2018   Formatting of this note might be different from the original. 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 Added automatically from request for  surgery [id number]   Paroxysmal atrial fibrillation (HCC) 05/06/2019   Permanent atrial fibrillation (HCC) 10/08/2018   Persistent atrial fibrillation, status post cardioversion in July 2020 about flip back to atrial fibrillation a few weeks later 01/21/2019   Postlaminectomy syndrome of lumbar region 04/27/2020   Postural dizziness with presyncope 10/25/2018   Prediabetes 07/30/2015   Right shoulder injury 10/08/2018   S/P arthroscopy of right shoulder 03/12/2019   S/P rotator cuff repair 03/12/2019   SI  (sacroiliac) pain 07/27/2020   Tear of right supraspinatus tendon 10/25/2018   Tremor 07/30/2015   Vitamin B 12 deficiency 07/30/2015   White matter disease, unspecified 11/19/2018   Noted on MRI of brain 2020. Discussed with pt and wife.    Past Surgical History:  Procedure Laterality Date   BACK SURGERY     CATARACT EXTRACTION, BILATERAL Bilateral 03/08/2021   03/22/2021   CHOLECYSTECTOMY     CORONARY STENT INTERVENTION N/A 02/16/2021   Procedure: CORONARY STENT INTERVENTION;  Surgeon: Corky Crafts, MD;  Location: [facility] INVASIVE CV LAB;  Service: Cardiovascular;  Laterality: N/A;   HERNIA REPAIR  2009   INTRAVASCULAR ULTRASOUND/IVUS N/A 02/16/2021   Procedure: Intravascular Ultrasound/IVUS;  Surgeon: Corky Crafts, MD;  Location: [facility] INVASIVE CV LAB;  Service: Cardiovascular;  Laterality: N/A;   LEFT HEART CATH AND CORONARY ANGIOGRAPHY N/A 10/25/2019   Procedure: LEFT HEART CATH AND CORONARY ANGIOGRAPHY;  Surgeon: Swazil[place], Peter M, MD;  Location: [facility] INVASIVE CV LAB;  Service: Cardiovascular;  Laterality: N/A;   LEFT HEART CATH AND CORONARY ANGIOGRAPHY N/A 02/16/2021   Procedure: LEFT HEART CATH AND CORONARY ANGIOGRAPHY;  Surgeon: Corky Crafts, MD;  Location: [facility] INVASIVE CV LAB;  Service: Cardiovascular;  Laterality: N/A;   ROTATOR CUFF REPAIR  03/07/2019   SHOULDER SURGERY Left 12/2010   Rotater cuff   SPINAL FUSION  1998   Dr Nadene Rubins   [facility] SURGERY  2001   Spinal hardware removal by Dr. Lilian Kapur    Current Medications: Current Meds  Medication Sig   apixaban (ELIQUIS) 5 MG TABS tablet Take 1 tablet (5 mg total) by mouth 2 (two) times daily.   carvedilol (COREG) 3.125 MG tablet Take 1 tablet (3.125 mg total) by mouth 2 (two) times daily.   Cholecalciferol (VITAMIN D3) 125 MCG (5000 UT) CAPS Take 5,000 Units by mouth daily.    clopidogrel (PLAVIX) 75 MG tablet Take 1 tablet (75 mg total) by mouth daily.   flecainide (TAMBOCOR) 50 MG tablet TAKE 1 TABLET BY MOUTH  TWICE (2) DAILY (Patient taking differently: Take 50 mg by mouth 2 (two) times daily. TAKE 1 TABLET BY MOUTH TWICE (2) DAILY)   nitroGLYCERIN (NITROSTAT) 0.4 MG SL tablet Place 1 tablet (0.4 mg total) under the tongue every 5 (five) minutes as needed. (Patient taking differently: Place 0.4 mg under the tongue every 5 (five) minutes as needed for chest pain.)   PARoxetine (PAXIL) 40 MG tablet Take 40 mg by mouth daily.    pregabalin (LYRICA) 75 MG capsule Take 1 capsule by mouth 2 (two) times daily.   rosuvastatin (CRESTOR) 20 MG tablet TAKE ONE TABLET BY MOUTH EVERY DAY (Patient taking differently: Take 20 mg by mouth daily.)   vitamin B-12 (CYANOCOBALAMIN) 1000 MCG tablet Take 1,000 mcg by mouth daily.     Allergies:   Patient has no known allergies.   Social History   Socioeconomic History   Marital status: Married    Spouse name: Not on file   Number of children:  Not on file   Years of education: Not on file   Highest education level: Not on file  Occupational History   Not on file  Tobacco Use   Smoking status: Never   Smokeless tobacco: Never  Vaping Use   Vaping Use: Never used  Substance and Sexual Activity   Alcohol use: Yes    Comment: once in a while   Drug use: Not Currently   Sexual activity: Not on file  Other Topics Concern   Not on file  Social History Narrative   Not on file   Social Determinants of Health   Financial Resource Strain: Not on file  Food Insecurity: Not on file  Transportation Needs: Not on file  Physical Activity: Not on file  Stress: Not on file  Social Connections: Not on file     Family History: The patient's family history includes Breast cancer in his mother; Dementia in his maternal grandmother and mother; Hypertension in his father and mother; Multiple sclerosis in his son; Prostate cancer in his maternal grandfather. ROS:   Please see the history of present illness.    All 14 point review of systems negative except as described  per history of present illness  EKGs/Labs/Other Studies Reviewed:      Recent Labs: 04/05/2021: ALT 22; BUN 23; Creatinine, Ser 0.95; Hemoglobin 16.3; Platelets 241; Potassium 4.4; Sodium 144  Recent Lipid Panel    Component Value Date/Time   CHOL 134 02/16/2021 0317   CHOL 121 03/06/2020 0923   TRIG 87 02/16/2021 0317   HDL 58 02/16/2021 0317   HDL 51 03/06/2020 0923   CHOLHDL 2.3 02/16/2021 0317   VLDL 17 02/16/2021 0317   LDLCALC 59 02/16/2021 0317   LDLCALC 53 03/06/2020 0923    Physical Exam:    VS:  BP 124/74 (BP Location: Left Arm, Patient Position: Sitting)    Pulse 78    Ht 6' (1.829 m)    Wt 230 lb 12.8 oz (104.7 kg)    SpO2 98%    BMI 31.30 kg/m     Wt Readings from Last 3 Encounters:  05/19/21 230 lb 12.8 oz (104.7 kg)  04/05/21 230 lb 12.8 oz (104.7 kg)  02/24/21 226 lb 12.8 oz (102.9 kg)     GEN:  Well nourished, well developed in no acute distress HEENT: Normal NECK: No JVD; No carotid bruits LYMPHATICS: No lymphadenopathy CARDIAC: RRR, no murmurs, no rubs, no gallops RESPIRATORY:  Clear to auscultation without rales, wheezing or rhonchi  ABDOMEN: Soft, non-tender, non-distended MUSCULOSKELETAL:  No edema; No deformity  SKIN: Warm and dry LOWER EXTREMITIES: no swelling NEUROLOGIC:  Alert and oriented x 3 PSYCHIATRIC:  Normal affect   ASSESSMENT:    1. Paroxysmal atrial fibrillation (HCC)   2. Coronary artery disease involving native coronary artery of native heart without angina pectoris   3. Malaise and fatigue   4. DOE (dyspnea on exertion)    PLAN:    In order of problems listed above:  Coronary disease he is on anticoagulation because of atrial fibrillation as well as antiplatelet agents which I will continue. Paroxysmal atrial fibrillation denies have any palpitation EKG today confirms sinus bradycardia rate of 58 denies having any palpitation continue anticoagulation. Malaise and fatigue so far I do not see cardiac explanation for the  symptomatology.  I will ask him to have B12 vitamin D level as well as TSH checked.  We will check also CBC High risk medication use he is on flecainide  denies have any recurrences of atrial fibrillation I spoke to EP team since he got preserved ejection fraction and no evidence of myocardial infarction we will continue present management.   Medication Adjustments/Labs and Tests Ordered: Current medicines are reviewed at length with the patient today.  Concerns regarding medicines are outlined above.  No orders of the defined types were placed in this encounter.  Medication changes: No orders of the defined types were placed in this encounter.   Signed, Georgeanna Lea, MD, [facility] 05/19/2021 2:17 PM    [facility]

## 2021-05-30 LAB — VITAMIN D 1,25 DIHYDROXY
Vitamin D 1, 25 (OH)2 Total: 35 pg/mL
Vitamin D2 1, 25 (OH)2: <10 pg/mL
Vitamin D3 1, 25 (OH)2: 33 pg/mL

## 2021-05-30 LAB — TSH: TSH: 0.794 u[IU]/mL (ref 0.450–4.500)

## 2021-05-30 LAB — VITAMIN B12: Vitamin B-12: 1110 pg/mL (ref 232–1245)

## 2021-06-01 ENCOUNTER — Other Ambulatory Visit: Payer: Self-pay | Admitting: Cardiology

## 2021-06-02 NOTE — Telephone Encounter (Signed)
Rosuvastatin 20 mg # 90 x 2 refills sent to   [facility] - [place], [place] - [address]

## 2021-06-15 ENCOUNTER — Other Ambulatory Visit: Payer: Self-pay | Admitting: Cardiology

## 2021-08-03 ENCOUNTER — Encounter: Payer: Self-pay | Admitting: Cardiology

## 2021-08-03 ENCOUNTER — Telehealth: Payer: Self-pay

## 2021-08-03 ENCOUNTER — Other Ambulatory Visit: Payer: Self-pay

## 2021-08-03 DIAGNOSIS — I429 Cardiomyopathy, unspecified: Secondary | ICD-10-CM

## 2021-08-03 NOTE — Telephone Encounter (Signed)
Patients wife sent a message that when the patient bends over he becomes very short of breath. Attempted to explore if the patient was having any other symptoms and the patients wife stated that he was only short of breath when he bends over. I reviewed this with Dr. Bing Matter and he gave orders for lab work. These orders were entered into the computer and the patient was notified.

## 2021-08-04 ENCOUNTER — Other Ambulatory Visit: Payer: Self-pay | Admitting: Cardiology

## 2021-08-04 LAB — BASIC METABOLIC PANEL
BUN/Creatinine Ratio: 19 (ref 10–24)
BUN: 18 mg/dL (ref 8–27)
CO2: 23 mmol/L (ref 20–29)
Calcium: 9.1 mg/dL (ref 8.6–10.2)
Chloride: 105 mmol/L (ref 96–106)
Creatinine, Ser: 0.94 mg/dL (ref 0.76–1.27)
Glucose: 99 mg/dL (ref 70–99)
Potassium: 4.8 mmol/L (ref 3.5–5.2)
Sodium: 140 mmol/L (ref 134–144)
eGFR: 89 mL/min/{1.73_m2} (ref >59)

## 2021-08-04 LAB — PRO B NATRIURETIC PEPTIDE: NT-Pro BNP: 53 pg/mL (ref 0–376)

## 2021-08-05 ENCOUNTER — Telehealth: Payer: Self-pay

## 2021-08-05 NOTE — Telephone Encounter (Signed)
-----   Message from Georgeanna Lea, MD sent at 08/05/2021 12:28 PM EST ----- ?All labs are normal ?

## 2021-08-05 NOTE — Telephone Encounter (Signed)
-----   Message from Robert J Krasowski, MD sent at 08/05/2021 12:28 PM EST ----- ?All labs are normal ?

## 2021-08-09 NOTE — Telephone Encounter (Signed)
LM to return my call. 

## 2021-08-13 NOTE — Telephone Encounter (Signed)
Spoke with [name], notified of the following  ?

## 2021-10-12 DIAGNOSIS — L720 Epidermal cyst: Secondary | ICD-10-CM

## 2021-10-12 HISTORY — DX: Epidermal cyst: L72.0

## 2021-10-19 ENCOUNTER — Ambulatory Visit: Payer: Medicare HMO | Admitting: Cardiology

## 2021-10-19 ENCOUNTER — Encounter: Payer: Self-pay | Admitting: Cardiology

## 2021-10-19 ENCOUNTER — Telehealth: Payer: Self-pay

## 2021-10-19 VITALS — BP 126/72 | HR 55 | Ht 72.0 in | Wt 230.8 lb

## 2021-10-19 DIAGNOSIS — E785 Hyperlipidemia, unspecified: Secondary | ICD-10-CM

## 2021-10-19 DIAGNOSIS — I2510 Atherosclerotic heart disease of native coronary artery without angina pectoris: Secondary | ICD-10-CM

## 2021-10-19 DIAGNOSIS — I480 Paroxysmal atrial fibrillation: Secondary | ICD-10-CM

## 2021-10-19 DIAGNOSIS — R0609 Other forms of dyspnea: Secondary | ICD-10-CM

## 2021-10-19 DIAGNOSIS — I420 Dilated cardiomyopathy: Secondary | ICD-10-CM | POA: Diagnosis not present

## 2021-10-19 NOTE — Telephone Encounter (Signed)
Procedure is scheduled for today. Cannot provide input on holding any meds due to this. Should be a low bleed risk procedure. ?

## 2021-10-19 NOTE — Progress Notes (Signed)
?Cardiology Office Note:   ? ?Date:  10/19/2021  ? ?ID:  Richard Spence, DOB 1955/08/03, MRN [id number] ? ?PCP:  [name]., MD  ?Cardiologist:  [name], MD   ? ?Referring MD: [name]., MD  ? ?Chief Complaint  ?Patient presents with  ? Clearance TBD  ? ? ?History of Present Illness:   ? ?Richard Spence is a 65 y.o. male  with past medical history significant for paroxysmal atrial fibrillation, he is anticoagulated, recent PTCA and stenting of the proximal LAD for ulcerated lesion.  His story is quite interesting.  Year ago he got cardiac catheterization he was identified to have about 45% stenosis in proximal LAD.  About 3 or 4 weeks before CT he presented to [facility] because of episode of chest pain he ruled out for myocardial infarction, stress test being done which was negative but after that he felt very poorly and I elected to do coronary CT angio.  Coronary CT angio showed ulcerated lesion in the proximal portion of LAD.  He was sent to the cardiac Cath Lab the ulceration of the lesion was confirmed with intraoperative coronary ultrasound sent drug-eluting stent was placed in the proximal portion of the LAD.   ?He comes today 2 months for follow-up.  Overall doing very well.  He denies have any chest pain tightness squeezing pressure burning chest.  He is able to cut the grass and have no difficulty doing it.  He required cyst removed from his lower lip and he is here to be evaluated from cardiac standpoint reviewed.  I do not see any cardiac contraindication for this procedure but is also question about anticoagulation and Eliquis.  I think we can hold anticoagulation for 48 hours however Eliquis I insisted to keep going on. ? ?Past Medical History:  ?Diagnosis Date  ? Abnormal stress test 10/21/2019  ? Acute pain of left knee 04/14/2020  ? Altered mental status 10/25/2018  ? Atrial fibrillation with rapid ventricular response ([place]) 10/08/2018  ? Atrial fibrillation with rapid  ventricular response ([place]) 10/08/2018  ? Cardiomyopathy ([place]) 10/25/2018  ? EF 25-30% on ST. 40% on ECHO 2020.  LVH  ? Chronic pain syndrome 04/27/2020  ? Coronary artery disease of native artery of native heart with stable angina pectoris ([place]) 12/02/2019  ? Formatting of this note might be different from the original. 10/2019. Cath at [facility] LAD 45%. EF 55-60%  ? Degeneration of lumbar intervertebral disc 07/30/2015  ? DOE (dyspnea on exertion) 10/16/2018  ? Facet arthropathy, cervical 01/30/2020  ? Formatting of this note might be different from the original. Added automatically from request for surgery [id number]  Formatting of this note might be different from the original. Added automatically from request for surgery [id number]  ? Fatigue 06/06/2019  ? Hearing loss 10/15/2018  ? High risk medication use 07/30/2015  ? Long-term use of aspirin therapy 10/15/2018  ? Malaise and fatigue 10/25/2018  ? Mild cognitive impairment 12/10/2018  ? Formatting of this note might be different from the original. Okay on eval  ? Mild episode of recurrent major depressive disorder ([place]) 07/30/2015  ? Last Assessment & Plan:  Relevant Hx: Course: Daily Update: Today's Plan:will refill his med for him today and he is stable with this  Electronically signed by: Baldemar Friday, [place] 07/30/15 [phone]  ? Mixed hyperlipidemia 07/30/2015  ? Pain of cervical facet joint 01/30/2020  ? Formatting of this note might be different from the original. Added automatically  from request for surgery [id number]  ? Paroxysmal atrial fibrillation (HCC) 05/06/2019  ? Permanent atrial fibrillation (HCC) 10/08/2018  ? Persistent atrial fibrillation, status post cardioversion in July 2020 about flip back to atrial fibrillation a few weeks later 01/21/2019  ? Postlaminectomy syndrome of lumbar region 04/27/2020  ? Postural dizziness with presyncope 10/25/2018  ? Prediabetes 07/30/2015  ? Right shoulder injury 10/08/2018  ? S/P arthroscopy of right shoulder 03/12/2019  ? S/P rotator cuff  repair 03/12/2019  ? SI (sacroiliac) pain 07/27/2020  ? Tear of right supraspinatus tendon 10/25/2018  ? Tremor 07/30/2015  ? Vitamin B 12 deficiency 07/30/2015  ? White matter disease, unspecified 11/19/2018  ? Noted on MRI of brain 2020. Discussed with pt and wife.  ? ? ?Past Surgical History:  ?Procedure Laterality Date  ? BACK SURGERY    ? CATARACT EXTRACTION, BILATERAL Bilateral 03/08/2021  ? 03/22/2021  ? CHOLECYSTECTOMY    ? CORONARY STENT INTERVENTION N/A 02/16/2021  ? Procedure: CORONARY STENT INTERVENTION;  Surgeon: Corky Crafts, MD;  Location: [facility] INVASIVE CV LAB;  Service: Cardiovascular;  Laterality: N/A;  ? HERNIA REPAIR  2009  ? INTRAVASCULAR ULTRASOUND/IVUS N/A 02/16/2021  ? Procedure: Intravascular Ultrasound/IVUS;  Surgeon: Corky Crafts, MD;  Location: [facility] INVASIVE CV LAB;  Service: Cardiovascular;  Laterality: N/A;  ? LEFT HEART CATH AND CORONARY ANGIOGRAPHY N/A 10/25/2019  ? Procedure: LEFT HEART CATH AND CORONARY ANGIOGRAPHY;  Surgeon: Swazil[place], Peter M, MD;  Location: [facility] INVASIVE CV LAB;  Service: Cardiovascular;  Laterality: N/A;  ? LEFT HEART CATH AND CORONARY ANGIOGRAPHY N/A 02/16/2021  ? Procedure: LEFT HEART CATH AND CORONARY ANGIOGRAPHY;  Surgeon: Corky Crafts, MD;  Location: [facility] INVASIVE CV LAB;  Service: Cardiovascular;  Laterality: N/A;  ? ROTATOR CUFF REPAIR  03/07/2019  ? SHOULDER SURGERY Left 12/2010  ? Rotater cuff  ? SPINAL FUSION  1998  ? Dr Nadene Rubins  ? SPINE SURGERY  2001  ? Spinal hardware removal by Dr. Lilian Kapur  ? ? ?Current Medications: ?Current Meds  ?Medication Sig  ? apixaban (ELIQUIS) 5 MG TABS tablet Take 1 tablet (5 mg total) by mouth 2 (two) times daily.  ? carvedilol (COREG) 3.125 MG tablet TAKE 1 TABLET BY MOUTH TWICE(2) DAILY (Patient taking differently: Take 3.125 mg by mouth 2 (two) times daily with a meal.)  ? Cholecalciferol (VITAMIN D3) 125 MCG (5000 UT) CAPS Take 5,000 Units by mouth daily.   ? clopidogrel (PLAVIX) 75 MG tablet Take 1 tablet (75 mg  total) by mouth daily.  ? diltiazem (CARDIZEM CD) 120 MG 24 hr capsule Take 120 mg by mouth daily.   ? flecainide (TAMBOCOR) 50 MG tablet TAKE 1 TABLET BY MOUTH TWICE (2) DAILY (Patient taking differently: Take 50 mg by mouth 2 (two) times daily. TAKE 1 TABLET BY MOUTH TWICE (2) DAILY)  ? nitroGLYCERIN (NITROSTAT) 0.4 MG SL tablet Place 1 tablet (0.4 mg total) under the tongue every 5 (five) minutes as needed. (Patient taking differently: Place 0.4 mg under the tongue every 5 (five) minutes as needed for chest pain.)  ? PARoxetine (PAXIL) 40 MG tablet Take 40 mg by mouth daily.   ? pregabalin (LYRICA) 75 MG capsule Take 2 capsules by mouth daily.  ? rosuvastatin (CRESTOR) 20 MG tablet TAKE ONE TABLET BY MOUTH EVERY DAY (Patient taking differently: Take 20 mg by mouth daily.)  ? vitamin B-12 (CYANOCOBALAMIN) 1000 MCG tablet Take 1,000 mcg by mouth daily.  ? [DISCONTINUED] pregabalin (LYRICA) 75 MG capsule Take 1 capsule  by mouth 2 (two) times daily.  ?  ? ?Allergies:   Patient has no known allergies.  ? ?Social History  ? ?Socioeconomic History  ? Marital status: Married  ?  Spouse name: Not on file  ? Number of children: Not on file  ? Years of education: Not on file  ? Highest education level: Not on file  ?Occupational History  ? Not on file  ?Tobacco Use  ? Smoking status: Never  ? Smokeless tobacco: Never  ?Vaping Use  ? Vaping Use: Never used  ?Substance and Sexual Activity  ? Alcohol use: Yes  ?  Comment: once in a while  ? Drug use: Not Currently  ? Sexual activity: Not on file  ?Other Topics Concern  ? Not on file  ?Social History Narrative  ? Not on file  ? ?Social Determinants of Health  ? ?Financial Resource Strain: Not on file  ?Food Insecurity: Not on file  ?Transportation Needs: Not on file  ?Physical Activity: Not on file  ?Stress: Not on file  ?Social Connections: Not on file  ?  ? ?Family History: ?The patient's family history includes Breast cancer in his mother; Dementia in his maternal  grandmother and mother; Hypertension in his father and mother; Multiple sclerosis in his son; Prostate cancer in his maternal grandfather. ?ROS:   ?Please see the history of present illness.    ?All 14 point review

## 2021-10-19 NOTE — Addendum Note (Signed)
Addended by: Baldo Ash D on: 10/19/2021 03:02 PM ? ? Modules accepted: Orders ? ?

## 2021-10-19 NOTE — Telephone Encounter (Signed)
? ?  [facility] Pre-operative Risk Assessment  ?  ?Request for surgical clearance: ? ?What type of surgery is being performed? Excision of cyst on the right lower lip  ? ?When is this surgery scheduled? 10/19/2021 ? ?What type of clearance is required (medical clearance vs. Pharmacy clearance to hold med vs. Both)? Pharmacy ? ?Are there any medications that need to be held prior to surgery and how long?Aspirin, Eliquis and Plavix( holding length not specified   ? ?Practice name and name of physician performing surgery? Dr. Mikki Santee  ? ?What is your office phone number :  [phone] ?  ?7.   What is your office fax number: [phone] ? ?8.   Anesthesia type (None, local, MAC, general) ? Not specified ? ? ?Richard Spence Richard Spence ?10/19/2021, 9:56 AM  ?_________________________________________________________________ ?  (provider comments below) ? ?

## 2021-10-19 NOTE — Patient Instructions (Addendum)
Medication Instructions:  ?Your physician recommends that you continue on your current medications as directed. Please refer to the Current Medication list given to you today.  ?*If you need a refill on your cardiac medications before your next appointment, please call your pharmacy* ? ? ?Lab Work: ?Lipid panel- Today ?If you have labs (blood work) drawn today and your tests are completely normal, you will receive your results only by: ?MyChart Message (if you have MyChart) OR ?A paper copy in the mail ?If you have any lab test that is abnormal or we need to change your treatment, we will call you to review the results. ? ? ?Testing/Procedures: ?None Ordered ? ? ?Follow-Up: ?At [facility], you and your health needs are our priority.  As part of our continuing mission to provide you with exceptional heart care, we have created designated Provider Care Teams.  These Care Teams include your primary Cardiologist (physician) and Advanced Practice Providers (APPs -  Physician Assistants and Nurse Practitioners) who all work together to provide you with the care you need, when you need it. ? ?We recommend signing up for the patient portal called "MyChart".  Sign up information is provided on this After Visit Summary.  MyChart is used to connect with patients for Virtual Visits (Telemedicine).  Patients are able to view lab/test results, encounter notes, upcoming appointments, etc.  Non-urgent messages can be sent to your provider as well.   ?To learn more about what you can do with MyChart, go to [email or web address].   ? ?Your next appointment:   ?6 month(s) ? ?The format for your next appointment:   ?In Person ? ?Provider:   ?[name], MD  ? ? ?Other Instructions ?NA  ?

## 2021-10-20 LAB — LIPID PANEL
Chol/HDL Ratio: 3 ratio (ref 0.0–5.0)
Cholesterol, Total: 132 mg/dL (ref 100–199)
HDL: 44 mg/dL (ref >39)
LDL Chol Calc (NIH): 60 mg/dL (ref 0–99)
Triglycerides: 168 mg/dL — ABNORMAL HIGH (ref 0–149)
VLDL Cholesterol Cal: 28 mg/dL (ref 5–40)

## 2021-10-20 NOTE — Telephone Encounter (Signed)
? ?  Patient Name: Richard Spence  ?DOB: [birth year] ?MRN: [id number] ? ?Primary Cardiologist: [name], MD ? ?Chart reviewed as part of pre-operative protocol coverage. Patient was cleared for surgery at outpatient visit with Dr. Bing Matter on 10/19/2021.  I have forwarded his note to the requesting party with recommendations for preoperative clearance via epic fax function.. ? ?Please contact our office with any questions.   ? ?Thank you. ? ? ?Joylene Grapes, NP ?10/20/2021, 9:53 AM ? ? ?

## 2021-10-20 NOTE — Telephone Encounter (Signed)
?  Dr. Cherlyn Cushing office calling to f/u clearance, they requested if it can refax to them at fax# [phone] ?

## 2021-10-20 NOTE — Telephone Encounter (Signed)
I will re-fax clearance notes to fax # given [phone] ?

## 2021-12-15 DIAGNOSIS — R220 Localized swelling, mass and lump, head: Secondary | ICD-10-CM

## 2021-12-15 HISTORY — DX: Localized swelling, mass and lump, head: R22.0

## 2022-02-25 ENCOUNTER — Encounter: Payer: Self-pay | Admitting: Gastroenterology

## 2022-02-28 ENCOUNTER — Telehealth: Payer: Self-pay | Admitting: Cardiology

## 2022-02-28 MED ORDER — FLECAINIDE ACETATE 50 MG PO TABS: 50.0000 mg | ORAL_TABLET | Freq: Two times a day (BID) | ORAL | 1 refills | Status: DC

## 2022-02-28 NOTE — Telephone Encounter (Signed)
Refill of Flecainide 50 mg sent to [place].

## 2022-02-28 NOTE — Telephone Encounter (Signed)
*  STAT* If patient is at the pharmacy, call can be transferred to refill team.   1. Which medications need to be refilled? (please list name of each medication and dose if known)  flecainide (TAMBOCOR) 50 MG tablet  2. Which pharmacy/location (including street and city if local pharmacy) is medication to be sent to? [place]  3. Do they need a 30 day or 90 day supply? 90  Pt completely out

## 2022-03-02 DIAGNOSIS — N529 Male erectile dysfunction, unspecified: Secondary | ICD-10-CM | POA: Insufficient documentation

## 2022-03-02 DIAGNOSIS — M654 Radial styloid tenosynovitis [de Quervain]: Secondary | ICD-10-CM

## 2022-03-02 HISTORY — DX: Male erectile dysfunction, unspecified: N52.9

## 2022-03-02 HISTORY — DX: Radial styloid tenosynovitis (de quervain): M65.4

## 2022-03-14 ENCOUNTER — Encounter: Payer: Self-pay | Admitting: Cardiovascular Disease

## 2022-03-14 ENCOUNTER — Ambulatory Visit: Payer: Medicare HMO | Attending: Cardiovascular Disease | Admitting: Cardiovascular Disease

## 2022-03-14 VITALS — BP 135/78 | HR 49 | Ht 72.0 in | Wt 235.0 lb

## 2022-03-14 DIAGNOSIS — G4733 Obstructive sleep apnea (adult) (pediatric): Secondary | ICD-10-CM | POA: Diagnosis not present

## 2022-03-14 DIAGNOSIS — I480 Paroxysmal atrial fibrillation: Secondary | ICD-10-CM | POA: Diagnosis not present

## 2022-03-14 DIAGNOSIS — I2510 Atherosclerotic heart disease of native coronary artery without angina pectoris: Secondary | ICD-10-CM | POA: Diagnosis not present

## 2022-03-14 DIAGNOSIS — I10 Essential (primary) hypertension: Secondary | ICD-10-CM | POA: Diagnosis not present

## 2022-03-14 DIAGNOSIS — E782 Mixed hyperlipidemia: Secondary | ICD-10-CM

## 2022-03-14 NOTE — Progress Notes (Signed)
Cardiology Office Note    Date:  03/20/2022   ID:  [name], DOB May 21, 1956, MRN [id number]  PCP:  [name]., MD  Cardiologist:  [name], MD (sleep); Dr. [name]  29 month F/U evaluation for sleep apnea  History of Present Illness:  Richard Spence is a 66 y.o. male who is followed by Dr. [name] for primary cardiology care.  He also has recently seen Dr. [name].  He was recently found to have sleep apnea and presents for his initial sleep evaluation following initiation of CPAP therapy.  Mr. Richard Spence has a history of atrial fibrillation and is status post cardioversion and has been maintaining sinus rhythm with flecainide.  He has been on chronic anticoagulation.  There also is a history of cardiomyopathy years ago with an ejection fraction reduced to 30% with subsequent improvement in LV function.  His echo Doppler study in December 2020 showed an EF of 50 to 55% with mild TR and estimated RV systolic pressure was 22 mmHg.  With his cardiac history and due to concerns for sleep apnea he was referred for a diagnostic polysomnogram on June 30, 2019.  He was found to have moderate sleep apnea overall with an AHI of 15.0 and RDI of 25.0.  However, sleep apnea was very severe during REM sleep with an AHI of 66.7/h with oxygen desaturation to a nadir of 85%.  There was moderate snoring.  He subsequently referred for CPAP titration study which was done on August 06, 2019.  He was titrated up to 15 cm water pressure with significant benefit in AHI 0 with O2 nadir at 93%.  There was set up with CPAP therapy and late March with [facility] patient in [place] as his DME company.  In the office today obtained a download from September 24, 2019 through Oct 23, 2019.  Compliance is excellent at 100%.  Average sleep duration is 6 hours and 35 minutes per night.  At 15 cm water pressure AHI is 0.9/h.  He initially had a significant leak when he was doing fast but over  the past 2 weeks he has been using a new nasal mask significant improvement in his prior leak.  Prior to CPAP therapy, he was noticing more frequent awakenings, sleep is nonrestorative, he was experiencing nocturia anywhere from 3-6 times per night which has been reduced to 1-2 times per night.  He notes a big difference.  He has more energy.  He feels rested.  An Epworth Sleepiness Scale score was calculated in the office today and this endorsed at 5 arguing against any residual daytime sleepiness.  There was only a slight chance of dozing while watching television, and a moderate chance of dozing as a passenger in a car for an hour without a break or lying down to rest in the afternoon when circumstances permit.  He is unaware of any breakthrough atrial fibrillation.  He denies any restless legs, bruxism, hypnagogic hallucinations, or cataplectic events.  He underwent a nuclear stress test on October 01, 2019.  This showed an EF of 51%.  There was a small reversible defect of mild severity in the basal inferior location with normal wall motion.  Due to concern for possible mild ischemia he underwent cardiac catheterization on Oct 25, 2019 by Dr. [name] and was found to have 45% proximal LAD stenosis and normal LV function.  He has been followed by Dr. [name] and after CT coronary  angiography suggested ulcerated lesion in the proximal portion of the LAD he underwent repeat catheterization on February 16, 2021.  IVUS showed eccentric, ulcerative morphology and an Onyx Frontier 3.5 x 34 mm stent was placed in his LAD.  He last saw Dr. [name] on Oct 19, 2021 preop evaluation prior to undergoing lip surgery and remained cardiac stable.  From a sleep perspective, Mr. Richard Spence has continued to use CPAP therapy.  I obtained a download from September 7 through March 11, 2022.  Usage is suboptimal with usage days at 18 of 30 (60% and average use at 2 hours and 25 minutes per night.  He is set at a 15 cm  pressure.  AHI was 0.5.  Typically he goes to bed between 10 and 11 PM and wakes up around 8 AM.  He admits that he does have vivid dreams at times the dreams may be disruptive.  Typically when he has these dreams which occur around 3:30 AM he gets very hot and takes his mask off.  His head typically moves and ultimately sleeps the remainder of the night without treatment.  He presents for evaluation.   Past Medical History:  Diagnosis Date   Abnormal stress test 10/21/2019   Acute pain of left knee 04/14/2020   Altered mental status 10/25/2018   Atrial fibrillation with rapid ventricular response ([place]) 10/08/2018   Atrial fibrillation with rapid ventricular response ([place]) 10/08/2018   Cardiomyopathy ([place]) 10/25/2018   EF 25-30% on ST. 40% on ECHO 2020.  LVH   Chronic pain syndrome 04/27/2020   Coronary artery disease of native artery of native heart with stable angina pectoris ([place]) 12/02/2019   Formatting of this note might be different from the original. 10/2019. Cath at [facility] LAD 45%. EF 55-60%   Degeneration of lumbar intervertebral disc 07/30/2015   DOE (dyspnea on exertion) 10/16/2018   Facet arthropathy, cervical 01/30/2020   Formatting of this note might be different from the original. Added automatically from request for surgery [id number]  Formatting of this note might be different from the original. Added automatically from request for surgery [id number]   Fatigue 06/06/2019   Hearing loss 10/15/2018   High risk medication use 07/30/2015   Long-term use of aspirin therapy 10/15/2018   Malaise and fatigue 10/25/2018   Mild cognitive impairment 12/10/2018   Formatting of this note might be different from the original. 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 Added automatically from request for surgery [id number]   Paroxysmal atrial fibrillation ([place]) 05/06/2019   Permanent atrial fibrillation ([place]) 10/08/2018   Persistent atrial fibrillation, status post cardioversion in July 2020 about flip back to atrial fibrillation a few weeks later 01/21/2019   Postlaminectomy syndrome of lumbar region 04/27/2020   Postural dizziness with presyncope 10/25/2018   Prediabetes 07/30/2015   Right shoulder injury 10/08/2018   S/P arthroscopy of right shoulder 03/12/2019   S/P rotator cuff repair 03/12/2019   SI (sacroiliac) pain 07/27/2020   Tear of right supraspinatus tendon 10/25/2018   Tremor 07/30/2015   Vitamin B 12 deficiency 07/30/2015   White matter disease, unspecified  11/19/2018   Noted on MRI of brain 2020. Discussed with pt and wife.    Past Surgical History:  Procedure Laterality Date   BACK SURGERY     CATARACT EXTRACTION, BILATERAL Bilateral 03/08/2021   03/22/2021   CHOLECYSTECTOMY     CORONARY STENT INTERVENTION N/A 02/16/2021   Procedure: CORONARY STENT INTERVENTION;  Surgeon: Jettie Booze, MD;  Location: [facility];  Service: Cardiovascular;  Laterality: N/A;   HERNIA REPAIR  2009   INTRAVASCULAR ULTRASOUND/IVUS N/A 02/16/2021   Procedure: Intravascular Ultrasound/IVUS;  Surgeon: Jettie Booze, MD;  Location: [facility];  Service: Cardiovascular;  Laterality: N/A;   LEFT HEART CATH AND CORONARY ANGIOGRAPHY N/A 10/25/2019   Procedure: LEFT HEART CATH AND CORONARY ANGIOGRAPHY;  Surgeon: [name], Peter M, MD;  Location: [facility];  Service: Cardiovascular;  Laterality: N/A;   LEFT HEART CATH AND CORONARY ANGIOGRAPHY N/A 02/16/2021   Procedure: LEFT HEART CATH AND CORONARY ANGIOGRAPHY;  Surgeon: Jettie Booze, MD;  Location: [facility];  Service: Cardiovascular;   Laterality: N/A;   ROTATOR CUFF REPAIR  03/07/2019   SHOULDER SURGERY Left 12/2010   Rotater cuff   SPINAL FUSION  1998   Dr Sonoita  2001   Spinal hardware removal by Dr. Sherryle Lis    Current Medications: Outpatient Medications Prior to Visit  Medication Sig Dispense Refill   apixaban (ELIQUIS) 5 MG TABS tablet Take 1 tablet (5 mg total) by mouth 2 (two) times daily. 60 tablet    carvedilol (COREG) 3.125 MG tablet TAKE 1 TABLET BY MOUTH TWICE(2) DAILY (Patient taking differently: Take 3.125 mg by mouth 2 (two) times daily with a meal.) 60 tablet 2   Cholecalciferol (VITAMIN D3) 125 MCG (5000 UT) CAPS Take 5,000 Units by mouth daily.      clopidogrel (PLAVIX) 75 MG tablet Take 1 tablet (75 mg total) by mouth daily. 90 tablet 3   flecainide (TAMBOCOR) 50 MG tablet Take 1 tablet (50 mg total) by mouth 2 (two) times daily. TAKE 1 TABLET BY MOUTH TWICE (2) DAILY 180 tablet 1   PARoxetine (PAXIL) 40 MG tablet Take 40 mg by mouth daily.      pregabalin (LYRICA) 75 MG capsule Take 2 capsules by mouth daily.     rosuvastatin (CRESTOR) 20 MG tablet TAKE ONE TABLET BY MOUTH EVERY DAY (Patient taking differently: Take 20 mg by mouth daily.) 90 tablet 2   vitamin B-12 (CYANOCOBALAMIN) 1000 MCG tablet Take 1,000 mcg by mouth daily.     diltiazem (CARDIZEM CD) 120 MG 24 hr capsule Take 120 mg by mouth daily.      nitroGLYCERIN (NITROSTAT) 0.4 MG SL tablet Place 1 tablet (0.4 mg total) under the tongue every 5 (five) minutes as needed. (Patient taking differently: Place 0.4 mg under the tongue every 5 (five) minutes as needed for chest pain.) 30 tablet 3   No facility-administered medications prior to visit.     Allergies:   Patient has no known allergies.   Social History   Socioeconomic History   Marital status: Married    Spouse name: Not on file   Number of children: Not on file   Years of education: Not on file   Highest education level: Not on file  Occupational History    Not on file  Tobacco Use   Smoking status: Never   Smokeless tobacco: Never  Vaping Use   Vaping Use: Never used  Substance and  Sexual Activity   Alcohol use: Yes    Comment: once in a while   Drug use: Not Currently   Sexual activity: Not on file  Other Topics Concern   Not on file  Social History Narrative   Not on file   Social Determinants of Health   Financial Resource Strain: Not on file  Food Insecurity: Not on file  Transportation Needs: Not on file  Physical Activity: Not on file  Stress: Not on file  Social Connections: Not on file    Socially, he was born in [place].  He had previously worked for [employer].  He is married and has 1 child age 34 and 2 grandchildren.  Family History:  The patient's family history includes Breast cancer in his mother; Dementia in his maternal grandmother and mother; Hypertension in his father and mother; Multiple sclerosis in his son; Prostate cancer in his maternal grandfather.   ROS General: Negative; No fevers, chills, or night sweats;  HEENT: Negative; No changes in vision or hearing, sinus congestion, difficulty swallowing Pulmonary: Negative; No cough, wheezing, shortness of breath, hemoptysis Cardiovascular: Positive for PAF, history of prior cardiomyopathy; recent mildly abnormal nuclear stress test GI: Negative; No nausea, vomiting, diarrhea, or abdominal pain GU: Negative; No dysuria, hematuria, or difficulty voiding Musculoskeletal: Negative; no myalgias, joint pain, or weakness Hematologic/Oncology: Negative; no easy bruising, bleeding Endocrine: Negative; no heat/cold intolerance; no diabetes Neuro: Negative; no changes in balance, headaches Skin: Negative; No rashes or skin lesions Psychiatric: Negative; No behavioral problems, depression Sleep: See HPI Other comprehensive 14 point system review is negative.   PHYSICAL EXAM:   VS:  BP 135/78 (BP Location: Left Arm, Patient Position: Sitting, Cuff Size:  Normal)   Pulse (!) 49   Ht 6' (1.829 m)   Wt 235 lb (106.6 kg)   BMI 31.87 kg/m     Repeat blood pressure by me 132/80  Wt Readings from Last 3 Encounters:  03/14/22 235 lb (106.6 kg)  10/19/21 230 lb 12.8 oz (104.7 kg)  05/19/21 230 lb 12.8 oz (104.7 kg)    General: Alert, oriented, no distress.  Skin: normal turgor, no rashes, warm and dry HEENT: Normocephalic, atraumatic. Pupils equal round and reactive to light; sclera anicteric; extraocular muscles intact;  Nose without nasal septal hypertrophy Mouth/Parynx benign; Mallinpatti scale 4 Neck: No JVD, no carotid bruits; normal carotid upstroke Lungs: clear to ausculatation and percussion; no wheezing or rales Chest wall: without tenderness to palpitation Heart: PMI not displaced, RRR, s1 s2 normal, 1/6 systolic murmur, no diastolic murmur, no rubs, gallops, thrills, or heaves Abdomen: soft, nontender; no hepatosplenomehaly, BS+; abdominal aorta nontender and not dilated by palpation. Back: no CVA tenderness Pulses 2+ Musculoskeletal: full range of motion, normal strength, no joint deformities Extremities: no clubbing cyanosis or edema, Homan's sign negative  Neurologic: grossly nonfocal; Cranial nerves grossly wnl Psychologic: Normal mood and affect       Studies/Labs Reviewed:   March 14, 2022 ECG (independently read by me): Sinus bradycardia at 49, QTc 381 msec  Oct 24, 2019 ECG (independently read by me): Sinus bradycardia 50 bpm.  Normal intervals.  No ectopy  Recent Labs:    Latest Ref Rng & Units 08/03/2021    2:44 PM 04/05/2021    1:19 PM 02/17/2021    2:47 AM  BMP  Glucose 70 - 99 mg/dL 99  112  85   BUN 8 - 27 mg/dL 18  23  14    Creatinine 0.76 -  1.27 mg/dL 0.94  0.95  1.07   BUN/Creat Ratio 10 - 24 19  24     Sodium 134 - 144 mmol/L 140  144  141   Potassium 3.5 - 5.2 mmol/L 4.8  4.4  3.8   Chloride 96 - 106 mmol/L 105  105  106   CO2 20 - 29 mmol/L 23  24  25    Calcium 8.6 - 10.2 mg/dL 9.1  9.4  8.5          Latest Ref Rng & Units 04/05/2021    1:19 PM  Hepatic Function  Total Protein 6.0 - 8.5 g/dL 6.9   Albumin 3.8 - 4.8 g/dL 4.5   AST 0 - 40 IU/L 22   ALT 0 - 44 IU/L 22   Alk Phosphatase 44 - 121 IU/L 71   Total Bilirubin 0.0 - 1.2 mg/dL 0.6        Latest Ref Rng & Units 04/05/2021    1:19 PM 02/17/2021    2:47 AM 02/16/2021   10:17 AM  CBC  WBC 3.4 - 10.8 x10E3/uL 5.8  8.5  6.8   Hemoglobin 13.0 - 17.7 g/dL 16.3  14.9  14.5   Hematocrit 37.5 - 51.0 % 47.8  43.6  42.6   Platelets 150 - 450 x10E3/uL 241  195  196    Lab Results  Component Value Date   MCV 91 04/05/2021   MCV 91.6 02/17/2021   MCV 91.2 02/16/2021   Lab Results  Component Value Date   TSH 0.794 05/19/2021   No results found for: "HGBA1C"   BNP No results found for: "BNP"  ProBNP    Component Value Date/Time   PROBNP 53 08/03/2021 1448     Lipid Panel     Component Value Date/Time   CHOL 132 10/19/2021 1505   TRIG 168 (H) 10/19/2021 1505   HDL 44 10/19/2021 1505   CHOLHDL 3.0 10/19/2021 1505   CHOLHDL 2.3 02/16/2021 0317   VLDL 17 02/16/2021 0317   LDLCALC 60 10/19/2021 1505   LABVLDL 28 10/19/2021 1505     RADIOLOGY: No results found.    Additional studies/ records that were reviewed today include:   CPAP Titration Study: 08/06/2019 CLINICAL INFORMATION The patient is referred for a CPAP titration to treat sleep apnea.   Date of NPSG: 06/30/2019:  AHI 15.0; RDI 25.0/h; REM AHI 66.7/h; supine AHI 22.5/h; O2 nadir 85%; moderate snoring.   SLEEP STUDY TECHNIQUE As per the AASM Manual for the Scoring of Sleep and Associated Events v2.3 (April 2016) with a hypopnea requiring 4% desaturations.   The channels recorded and monitored were frontal, central and occipital EEG, electrooculogram (EOG), submentalis EMG (chin), nasal and oral airflow, thoracic and abdominal wall motion, anterior tibialis EMG, snore microphone, electrocardiogram, and pulse oximetry. Continuous positive  airway pressure (CPAP) was initiated at the beginning of the study and titrated to treat sleep-disordered breathing.   MEDICATIONS apixaban (ELIQUIS) 5 MG TABS tablet Ascorbic Acid (VITAMIN C) 1000 MG tablet carvedilol (COREG) 6.25 MG tablet Cholecalciferol (VITAMIN D3) 125 MCG (5000 UT) CAPS Cyanocobalamin (VITAMIN B12 PO) diltiazem (CARDIZEM CD) 120 MG 24 hr capsule flecainide (TAMBOCOR) 50 MG tablet PARoxetine (PAXIL) 40 MG tablet Zinc 50 MG TABS   Medications self-administered by patient taken the night of the study : N/A   TECHNICIAN COMMENTS Comments added by technician: PATIENT WAS ORDERED AS A CPAP TITRATION. Comments added by scorer: N/A   RESPIRATORY PARAMETERS Optimal PAP Pressure (cm):  15  AHI at Optimal Pressure (/hr):            0.0 Overall Minimal O2 (%):         89.0     Supine % at Optimal Pressure (%):    0 Minimal O2 at Optimal Pressure (%): 93.0        SLEEP ARCHITECTURE The study was initiated at 11:07:30 PM and ended at 5:05:40 AM.   Sleep onset time was 53.6 minutes and the sleep efficiency was 78.0%%. The total sleep time was 279.5 minutes.   The patient spent 8.6%% of the night in stage N1 sleep, 91.4%% in stage N2 sleep, 0.0%% in stage N3 and 0% in REM.Stage REM latency was N/A minutes   Wake after sleep onset was 25.1. Alpha intrusion was absent. Supine sleep was 19.50%.   CARDIAC DATA The 2 lead EKG demonstrated sinus rhythm. The mean heart rate was 46.4 beats per minute. Other EKG findings include: None.   LEG MOVEMENT DATA The total Periodic Limb Movements of Sleep (PLMS) were 0. The PLMS index was 0.0. A PLMS index of <15 is considered normal in adults.   IMPRESSIONS - CPAP was started at 5 cm and was titrated to optimal PAP pressure at15 cm of water; AHI 0/h; RDI 1.2/h; O2 nadir 93%. - Central sleep apnea was not noted during this titration (CAI = 0.0/h). - Mild oxygen desaturations to a nadir of 89.0% at 7 cm. - The patient snored  with soft snoring volume during this titration study. - No cardiac abnormalities were observed during this study. - Clinically significant periodic limb movements were not noted during this study. Arousals associated with PLMs were significant.   DIAGNOSIS - Obstructive Sleep Apnea (327.23 [G47.33 ICD-10]) - Periodic Limb Movement During Sleep (327.51 [G47.61 ICD-10])   RECOMMENDATIONS - Recommend an initial trial of CPAP therapy with EPR at 15 cm H2O with heated humidification.  A Medium size Fisher&Paykel Full Face Mask Simplus mask was used for thte titration. - Effort should be made to optimize nasal and pharygeal patency.  - Avoid alcohol, sedatives and other CNS depressants that may worsen sleep apnea and disrupt normal sleep architecture. - Sleep hygiene should be reviewed to assess factors that may improve sleep quality. - Weight management and regular exercise should be initiated or continued. - Recommend a download in 30 days and sleep clinic evaluation after 4 weeks of therapy.     [Electronically signed] 08/12/2019 04:42 PM    ASSESSMENT:    1. OSA (obstructive sleep apnea)   2. Paroxysmal atrial fibrillation (HCC)   3. Coronary artery disease involving native coronary artery of native heart without angina pectoris   4. Primary hypertension   5. Mixed hyperlipidemia     PLAN:  Mr. Jonny Dearden is a 84 gentleman who is originally from [place] who has a history of hypertension, hyperlipidemia, paroxysmal atrial fibrillation for which he has undergone cardioversion and has been maintaining sinus rhythm on flecainide therapy.  He is on anticoagulation.  He has undergone stenting to his proximal LAD in September 2022.  With his cardiac history as well as symptoms worrisome for sleep apnea including snoring, frequent nocturia, nonrestorative sleep, he ultimately was referred for a sleep study which confirmed moderate overall sleep apnea; however this was very severe during  REM sleep with associated oxygen desaturation to 85%.  When I saw him for my initial evaluation on Oct 24, 2019 I had an extensive discussion with him in the office and  reviewed both his diagnostic polysomnogram as well as a CPAP titration study.  I reviewed normal sleep architecture and the effects of untreated sleep apnea with reference to sleep architecture.  I specifically discussed the effects of untreated sleep apnea contributing to blood pressure elevation, nocturnal arrhythmias, potential ischemia mediated coronary as well as cerebrovascular events in addition to its effect on glucose and GERD.  When I saw him for my initial sleep evaluation he noticed marked fit in his sleep since initiating CPAP therapy with dramatic change in previous nocturia 4-5 times per night down to 1-2 times per night.  At that time his sleep was restorative.  Presently, he admits to having significant vivid dreams which often wakes him up at 3:30 AM, he gets very hot and feels his head moving.  This has resulted in significant reduction in his CPAP use.  Since these seem to occur in the latter portion of the night it raises concern for possible REM behavior disorder.  If this persists he may need to have evaluation MRI of the head since if this is a rem behavior disorder there is a potential risk for future development of Parkinson's disease, Lewy body disorder or other neurologic entity.  Presently, I am changing his CPAP settings from a set pressure of 15 to an auto mode at a range of 8 to 16 cm of water.  I also have recommended he change his mask to a ResMed F30i mask.  He continues to be on carvedilol 3.125 mg twice a day diltiazem 120 mg, and flecainide 50 mg twice a day.  He is on Eliquis for anticoagulation.  He is on rosuvastatin for hyperlipidemia with target LDL less than 70.  He is on pregabalin for neuropathy and takes Paxil.  He will follow-up with Dr. Cheron Schaumann for primary care.  I will see him in 6 to 9 months for  follow-up sleep evaluation or sooner as needed.   Medication Adjustments/Labs and Tests Ordered: Current medicines are reviewed at length with the patient today.  Concerns regarding medicines are outlined above.  Medication changes, Labs and Tests ordered today are listed in the Patient Instructions below. Patient Instructions  Follow-Up: At [facility], you and your health needs are our priority.  As part of our continuing mission to provide you with exceptional heart care, we have created designated Provider Care Teams.  These Care Teams include your primary Cardiologist (physician) and Advanced Practice Providers (APPs -  Physician Assistants and Nurse Practitioners) who all work together to provide you with the care you need, when you need it.  We recommend signing up for the patient portal called "MyChart".  Sign up information is provided on this After Visit Summary.  MyChart is used to connect with patients for Virtual Visits (Telemedicine).  Patients are able to view lab/test results, encounter notes, upcoming appointments, etc.  Non-urgent messages can be sent to your provider as well.   To learn more about what you can do with MyChart, go to [email or web address].    Your next appointment:   6-9 month(s)  The format for your next appointment:   In Person  Provider:   Dr. Claiborne Billings (sleep clinic)      Signed, [name], MD  03/20/2022 3:03 PM    [facility] [address] [place], [place], [place]  [address] Phone: [phone]

## 2022-03-14 NOTE — Patient Instructions (Signed)
Follow-Up: At [facility], you and your health needs are our priority.  As part of our continuing mission to provide you with exceptional heart care, we have created designated Provider Care Teams.  These Care Teams include your primary Cardiologist (physician) and Advanced Practice Providers (APPs -  Physician Assistants and Nurse Practitioners) who all work together to provide you with the care you need, when you need it.  We recommend signing up for the patient portal called "MyChart".  Sign up information is provided on this After Visit Summary.  MyChart is used to connect with patients for Virtual Visits (Telemedicine).  Patients are able to view lab/test results, encounter notes, upcoming appointments, etc.  Non-urgent messages can be sent to your provider as well.   To learn more about what you can do with MyChart, go to [email or web address].    Your next appointment:   6-9 month(s)  The format for your next appointment:   In Person  Provider:   Dr. Claiborne Billings (sleep clinic)

## 2022-03-20 ENCOUNTER — Encounter: Payer: Self-pay | Admitting: Cardiovascular Disease

## 2022-04-22 ENCOUNTER — Other Ambulatory Visit: Payer: Self-pay | Admitting: Cardiology

## 2022-05-02 ENCOUNTER — Encounter: Payer: Self-pay | Admitting: Gastroenterology

## 2022-05-02 ENCOUNTER — Ambulatory Visit (INDEPENDENT_AMBULATORY_CARE_PROVIDER_SITE_OTHER): Payer: Medicare HMO | Admitting: Gastroenterology

## 2022-05-02 ENCOUNTER — Telehealth: Payer: Self-pay

## 2022-05-02 VITALS — BP 120/70 | HR 59 | Ht 72.0 in | Wt 232.2 lb

## 2022-05-02 DIAGNOSIS — Z86010 Personal history of colonic polyps: Secondary | ICD-10-CM

## 2022-05-02 NOTE — Telephone Encounter (Signed)
Dr. Bing Matter,  Thank you for approving the Plavix hold. For completeness, do you recommend patient take Aspirin while holding Plavix given the length of his stent?

## 2022-05-02 NOTE — Telephone Encounter (Signed)
 [facility] Pre-operative Risk Assessment     Request for surgical clearance:     Endoscopy Procedure  What type of surgery is being performed?     Colonoscopy  When is this surgery scheduled?     07-04-2022  What type of clearance is required ?   Pharmacy  Are there any medications that need to be held prior to surgery and how long? Plavix 5 day hold. Eliquis 1 day hold   Practice name and name of physician performing surgery?      [facility]  What is your office phone and fax number?      Phone- [phone]  [phone]  Anesthesia type (None, local, MAC, general) ?       MAC

## 2022-05-02 NOTE — Progress Notes (Signed)
Chief Complaint:   Referring Provider:  [name]., MD      ASSESSMENT AND PLAN;   #1. H/O Polyps  Plan: -Records from [facility]/Dr M. -Colon with miralax after Cardiac clearence from Dr Raliegh Ip, and holding Eliquis 1 day before/plavix 5 days before. Can take ASA when plavis is on hold.   Discussed risks & benefits of colonoscopy. Risks including rare perforation req laparotomy, bleeding after bx/polypectomy req blood transfusion, rarely missing neoplasms, risks of anesthesia/sedation, rare risk of damage to internal organs. Benefits outweigh the risks. Patient agrees to proceed. All the questions were answered. Pt consents to proceed. D/W wife.   HPI:    Richard Spence is a 66 y.o. male  With CAD s/p DES 02/2021 (EF 50-55%). A Fib on Eliquis/plavix, OSA on CPAP, HLD Accompanied by his wife who is patient of ours.  Here for colon d/t H/O polyps (Dr. Lyda Jester). Got letter to to get repeat colon.  No nausea, vomiting, heartburn, regurgitation, odynophagia or dysphagia.  No significant diarrhea or constipation.  No melena or hematochezia. No unintentional weight loss. No abdominal pain.  No family history of colon cancer or colon polyps.   Previous GI work-up: -colon Dr Jerilynn Mages- 6 yrs ago- had polyps. Rpt 5 yrs. -EGD with dil Dr Jerilynn Mages but had aspiration 2000  Past Medical History:  Diagnosis Date   Abnormal stress test 10/21/2019   Acute pain of left knee 04/14/2020   Altered mental status 10/25/2018   Atrial fibrillation with rapid ventricular response ([place]) 10/08/2018   Atrial fibrillation with rapid ventricular response ([place]) 10/08/2018   Cardiomyopathy ([place]) 10/25/2018   EF 25-30% on ST. 40% on ECHO 2020.  LVH   Chronic pain syndrome 04/27/2020   Coronary artery disease of native artery of native heart with stable angina pectoris ([place]) 12/02/2019   Formatting of this note might be different from the original. 10/2019. Cath at [facility] LAD 45%. EF 55-60%   Degeneration of lumbar intervertebral  disc 07/30/2015   DOE (dyspnea on exertion) 10/16/2018   Facet arthropathy, cervical 01/30/2020   Formatting of this note might be different from the original. Added automatically from request for surgery [id number]  Formatting of this note might be different from the original. Added automatically from request for surgery [id number]   Fatigue 06/06/2019   Hearing loss 10/15/2018   High risk medication use 07/30/2015   Long-term use of aspirin therapy 10/15/2018   Malaise and fatigue 10/25/2018   Mild cognitive impairment 12/10/2018   Formatting of this note might be different from the original. 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 Added automatically from request for surgery [id number]   Paroxysmal atrial fibrillation ([place]) 05/06/2019   Permanent atrial fibrillation ([place]) 10/08/2018   Persistent atrial fibrillation, status post cardioversion in July 2020 about flip back to atrial fibrillation a few weeks later 01/21/2019   Postlaminectomy syndrome of lumbar region 04/27/2020   Postural dizziness with presyncope 10/25/2018   Prediabetes 07/30/2015   Right shoulder injury 10/08/2018   S/P arthroscopy of right shoulder 03/12/2019   S/P rotator cuff repair 03/12/2019  SI (sacroiliac) pain 07/27/2020   Tear of right supraspinatus tendon 10/25/2018   Tremor 07/30/2015   Vitamin B 12 deficiency 07/30/2015   White matter disease, unspecified 11/19/2018   Noted on MRI of brain 2020. Discussed with pt and wife.    Past Surgical History:  Procedure Laterality Date   BACK SURGERY     CATARACT EXTRACTION, BILATERAL Bilateral 03/08/2021    03/22/2021   CHOLECYSTECTOMY     CORONARY STENT INTERVENTION N/A 02/16/2021   Procedure: CORONARY STENT INTERVENTION;  Surgeon: Corky Crafts, MD;  Location: [facility] INVASIVE CV LAB;  Service: Cardiovascular;  Laterality: N/A;   HERNIA REPAIR  2009   INTRAVASCULAR ULTRASOUND/IVUS N/A 02/16/2021   Procedure: Intravascular Ultrasound/IVUS;  Surgeon: Corky Crafts, MD;  Location[facility] INVASIVE CV LAB;  Service: Cardiovascular;  Laterality: N/A;   LEFT HEART CATH AND CORONARY ANGIOGRAPHY N/A 10/25/2019   Procedure: LEFT HEART CATH AND CORONARY ANGIOGRAPHY;  Surgeon: [place], Peter M, MD;  Location: [facility] INVASIVE CV LAB;  Service: Cardiovascular;  Laterality: N/A;   LEFT HEART CATH AND CORONARY ANGIOGRAPHY N/A 02/16/2021   Procedure: LEFT HEART CATH AND CORONARY ANGIOGRAPHY;  Surgeon: Corky Crafts, MD;  Location: [facility] INVASIVE CV LAB;  Service: Cardiovascular;  Laterality: N/A;   ROTATOR CUFF REPAIR  03/07/2019   SHOULDER SURGERY Left 12/2010   Rotater cuff   SPINAL FUSION  1998   Dr Nadene Rubins   [facility] SURGERY  2001   Spinal hardware removal by Dr. Lilian Kapur    Family History  Problem Relation Age of Onset   Dementia Mother    Breast cancer Mother    Hypertension Mother    Hypertension Father    Dementia Maternal Grandmother    Prostate cancer Maternal Grandfather    Multiple sclerosis Son    Colon cancer Neg Hx    Rectal cancer Neg Hx    Stomach cancer Neg Hx    Liver cancer Neg Hx    Pancreatic cancer Neg Hx    Esophageal cancer Neg Hx     Social History   Tobacco Use   Smoking status: Never   Smokeless tobacco: Never  Vaping Use   Vaping Use: Never used  Substance Use Topics   Alcohol use: Not Currently    Comment: since March 2023   Drug use: Not Currently    Current Outpatient Medications  Medication Sig Dispense Refill   apixaban (ELIQUIS) 5 MG TABS tablet Take 1 tablet (5 mg total) by mouth 2 (two) times daily. 60 tablet    carvedilol (COREG) 3.125 MG tablet  TAKE 1 TABLET BY MOUTH TWICE(2) DAILY (Patient taking differently: Take 3.125 mg by mouth 2 (two) times daily with a meal.) 60 tablet 2   Cholecalciferol (VITAMIN D3) 125 MCG (5000 UT) CAPS Take 5,000 Units by mouth daily.      clopidogrel (PLAVIX) 75 MG tablet TAKE ONE (1) TABLET BY MOUTH EVERY DAY 90 tablet 3   flecainide (TAMBOCOR) 50 MG tablet Take 1 tablet (50 mg total) by mouth 2 (two) times daily. TAKE 1 TABLET BY MOUTH TWICE (2) DAILY 180 tablet 1   PARoxetine (PAXIL) 40 MG tablet Take 40 mg by mouth daily.      pregabalin (LYRICA) 75 MG capsule Take 2 capsules by mouth daily.     rosuvastatin (CRESTOR) 20 MG tablet TAKE ONE TABLET BY MOUTH EVERY DAY (Patient taking differently: Take 20 mg by mouth daily.) 90 tablet 2   vitamin B-12 (CYANOCOBALAMIN) 1000 MCG tablet Take  1,000 mcg by mouth daily.     diltiazem (CARDIZEM CD) 120 MG 24 hr capsule Take 120 mg by mouth daily.      nitroGLYCERIN (NITROSTAT) 0.4 MG SL tablet Place 1 tablet (0.4 mg total) under the tongue every 5 (five) minutes as needed. (Patient taking differently: Place 0.4 mg under the tongue every 5 (five) minutes as needed for chest pain.) 30 tablet 3   No current facility-administered medications for this visit.    No Known Allergies  Review of Systems:  Constitutional: Denies fever, chills, diaphoresis, appetite change and fatigue.  HEENT: Denies photophobia, eye pain, redness, hearing loss, ear pain, congestion, sore throat, rhinorrhea, sneezing, mouth sores, neck pain, neck stiffness and tinnitus.   Respiratory: Denies SOB, DOE, cough, chest tightness,  and wheezing.   Cardiovascular: Denies chest pain, palpitations and leg swelling.  Genitourinary: Denies dysuria, urgency, frequency, hematuria, flank pain and difficulty urinating.  Musculoskeletal: Denies myalgias, back pain, joint swelling, arthralgias and gait problem.  Skin: No rash.  Neurological: Denies dizziness, seizures, syncope, weakness, light-headedness,  numbness and headaches.  Hematological: Denies adenopathy. Has Easy bruising. Psychiatric/Behavioral: No anxiety or depression     Physical Exam:    BP 120/70   Pulse (!) 59   Ht 6' (1.829 m)   Wt 232 lb 4 oz (105.3 kg)   SpO2 95%   BMI 31.50 kg/m  Wt Readings from Last 3 Encounters:  05/02/22 232 lb 4 oz (105.3 kg)  03/14/22 235 lb (106.6 kg)  10/19/21 230 lb 12.8 oz (104.7 kg)   Constitutional:  Well-developed, in no acute distress. Psychiatric: Normal mood and affect. Behavior is normal. HEENT: Pupils normal.  Conjunctivae are normal. No scleral icterus. Cardiovascular: Normal rate, regular rhythm. No edema Pulmonary/chest: Effort normal and breath sounds normal. No wheezing, rales or rhonchi. Abdominal: Soft, nondistended. Nontender. Bowel sounds active throughout. There are no masses palpable. No hepatomegaly.  Well-healed surgical scars.  Rectal muscle diastases. Rectal: Deferred Neurological: Alert and oriented to person place and time. Skin: Skin is warm and dry. No rashes noted.  Data Reviewed: I have personally reviewed following labs and imaging studies  CBC:    Latest Ref Rng & Units 04/05/2021    1:19 PM 02/17/2021    2:47 AM 02/16/2021   10:17 AM  CBC  WBC 3.4 - 10.8 x10E3/uL 5.8  8.5  6.8   Hemoglobin 13.0 - 17.7 g/dL 16.3  14.9  14.5   Hematocrit 37.5 - 51.0 % 47.8  43.6  42.6   Platelets 150 - 450 x10E3/uL 241  195  196     CMP:    Latest Ref Rng & Units 08/03/2021    2:44 PM 04/05/2021    1:19 PM 02/17/2021    2:47 AM  CMP  Glucose 70 - 99 mg/dL 99  112  85   BUN 8 - 27 mg/dL 18  23  14    Creatinine 0.76 - 1.27 mg/dL 0.94  0.95  1.07   Sodium 134 - 144 mmol/L 140  144  141   Potassium 3.5 - 5.2 mmol/L 4.8  4.4  3.8   Chloride 96 - 106 mmol/L 105  105  106   CO2 20 - 29 mmol/L 23  24  25    Calcium 8.6 - 10.2 mg/dL 9.1  9.4  8.5   Total Protein 6.0 - 8.5 g/dL  6.9    Total Bilirubin 0.0 - 1.2 mg/dL  0.6    Alkaline Phos 44 - 121  IU/L  71     AST 0 - 40 IU/L  22    ALT 0 - 44 IU/L  22         Carmell Austria, MD 05/02/2022, 9:48 AM  Cc: [name]., MD

## 2022-05-02 NOTE — Patient Instructions (Addendum)
_______________________________________________________  If you are age 67 or older, your body mass index should be between 23-30. Your Body mass index is 31.5 kg/m. If this is out of the aforementioned range listed, please consider follow up with your Primary Care Provider.  If you are age 11 or younger, your body mass index should be between 19-25. Your Body mass index is 31.5 kg/m. If this is out of the aformentioned range listed, please consider follow up with your Primary Care Provider.   ________________________________________________________  The [facility] providers would like to encourage you to use [facility] to communicate with providers for non-urgent requests or questions.  Due to long hold times on the telephone, sending your provider a message by [facility] may be a faster and more efficient way to get a response.  Please allow 48 business hours for a response.  Please remember that this is for non-urgent requests.  _______________________________________________________  Bonita Quin will be contacted by our office prior to your procedure for directions on holding your Plavix/Eliquis.  If you do not hear from our office 2 week prior to your scheduled procedure, please call [phone] to discuss.   You have been scheduled for a colonoscopy. Please follow written instructions given to you at your visit today.  Please pick up your prep supplies at the pharmacy within the next 1-3 days. If you use inhalers (even only as needed), please bring them with you on the day of your procedure.  Thank you,  Dr. Lynann Bologna

## 2022-05-02 NOTE — Telephone Encounter (Signed)
Dr, Bing Matter,   Request for Plavix to be held for 5 days and Eliquis for 1 day prior to Colonoscopy 07/04/2022. Patient has DES to proximal LAD 3.5 x 34. He was doing well at last OV May 2023 without CP or DOE. Per preop protocol, will need guidance for holding Plavix prior to scheduling virtual visit. Should you agree holding Plavix do you recommend starting aspirin in the interim?

## 2022-05-02 NOTE — Telephone Encounter (Signed)
Patient with diagnosis of afib on Eliquis for anticoagulation.    Procedure: colonoscopy Date of procedure: 07/04/22  CHA2DS2-VASc Score = 2  This indicates a 2.2% annual risk of stroke. The patient's score is based upon: CHF History: 0 HTN History: 0 Diabetes History: 0 Stroke History: 0 Vascular Disease History: 1 Age Score: 1 Gender Score: 0   CrCl >117mL/min Platelet count 223K  Per office protocol, patient can hold Eliquis for 1 day prior to procedure.    **This guidance is not considered finalized until pre-operative APP has relayed final recommendations.**

## 2022-05-04 NOTE — Telephone Encounter (Signed)
   Patient Name: Richard Spence  DOB: 1956-01-08 MRN: [id number]  Primary Cardiologist: [name], MD  Chart reviewed as part of pre-operative protocol coverage. Pharmacy clearance only. Per office protocols and pharmacist review , Richard Spence may hold Plavix 5 days prior to planned procedure and Eliquis 1 day prior to planned procedure. He does not take Aspirin.  I will route this recommendation to the requesting party via Epic fax function and remove from pre-op pool.  Please call with questions.  Alver Sorrow, NP 05/04/2022, 9:26 AM

## 2022-05-05 NOTE — Telephone Encounter (Signed)
LVM for patient to call back on wife's phone. Home rang busy

## 2022-05-10 NOTE — Telephone Encounter (Signed)
Wife called and was told that he can hold the plavix 5 days prior and Eliquis 1 day prior. She asked if this is covered with instructions for his colon and I told her that our insurance people make sure its authorized to have and that we wont know cost not until everything has been billed to [employer] and we can't guarantee that insurance will handle all the cost but she can call them see given his history and if something was collected the day of procedure then yes there is definitely a possibility that there could be payment after insurance pays

## 2022-05-10 NOTE — Telephone Encounter (Signed)
LVM on home and cell 

## 2022-06-27 ENCOUNTER — Encounter: Payer: Self-pay | Admitting: Gastroenterology

## 2022-07-04 ENCOUNTER — Encounter: Payer: Self-pay | Admitting: Gastroenterology

## 2022-07-04 ENCOUNTER — Ambulatory Visit (AMBULATORY_SURGERY_CENTER): Payer: Medicare HMO | Admitting: Gastroenterology

## 2022-07-04 VITALS — BP 116/70 | HR 48 | Temp 96.0°F | Resp 23 | Ht 72.0 in | Wt 232.0 lb

## 2022-07-04 DIAGNOSIS — Z86010 Personal history of colonic polyps: Secondary | ICD-10-CM

## 2022-07-04 DIAGNOSIS — Z09 Encounter for follow-up examination after completed treatment for conditions other than malignant neoplasm: Secondary | ICD-10-CM | POA: Diagnosis present

## 2022-07-04 MED ORDER — SODIUM CHLORIDE 0.9 % IV SOLN: 500.0000 mL | Freq: Once | INTRAVENOUS | Status: DC

## 2022-07-04 NOTE — Progress Notes (Signed)
Report to PACU, RN, vss, BBS= Clear.  

## 2022-07-04 NOTE — Progress Notes (Signed)
Chief Complaint:   Referring Provider:  [name], MD      ASSESSMENT AND PLAN;   #1. H/O Polyps  Plan: -Records from [facility]/Dr M. -Colon with miralax    Cardiac clearence from Dr Kirtland Bouchard, and holding Eliquis 1 day before/plavix 5 days before. Can take ASA when plavis is on hold.- Obtained   Discussed risks & benefits of colonoscopy. Risks including rare perforation req laparotomy, bleeding after bx/polypectomy req blood transfusion, rarely missing neoplasms, risks of anesthesia/sedation, rare risk of damage to internal organs. Benefits outweigh the risks. Patient agrees to proceed. All the questions were answered. Pt consents to proceed. D/W wife.   HPI:    Richard SHAMOON is a 67 y.o. male  With CAD s/p DES 02/2021 (EF 50-55%). A Fib on Eliquis/plavix, OSA on CPAP, HLD Accompanied by his wife who is patient of ours.  Here for colon d/t H/O polyps (Dr. Jennye Boroughs). Got letter to to get repeat colon.  No nausea, vomiting, heartburn, regurgitation, odynophagia or dysphagia.  No significant diarrhea or constipation.  No melena or hematochezia. No unintentional weight loss. No abdominal pain.  No family history of colon cancer or colon polyps.   Previous GI work-up: -colon Dr Judie Petit- 6 yrs ago- had polyps. Rpt 5 yrs. -EGD with dil Dr Judie Petit but had aspiration 2000  Past Medical History:  Diagnosis Date   Abnormal stress test 10/21/2019   Acute pain of left knee 04/14/2020   Altered mental status 10/25/2018   Atrial fibrillation with rapid ventricular response (HCC) 10/08/2018   Atrial fibrillation with rapid ventricular response (HCC) 10/08/2018   Cardiomyopathy (HCC) 10/25/2018   EF 25-30% on ST. 40% on ECHO 2020.  LVH   Chronic pain syndrome 04/27/2020   Coronary artery disease of native artery of native heart with stable angina pectoris (HCC) 12/02/2019   Formatting of this note might be different from the original. 10/2019. Cath at [facility] LAD 45%. EF 55-60%   Degeneration of lumbar  intervertebral disc 07/30/2015   DOE (dyspnea on exertion) 10/16/2018   Facet arthropathy, cervical 01/30/2020   Formatting of this note might be different from the original. Added automatically from request for surgery [id number]  Formatting of this note might be different from the original. Added automatically from request for surgery [id number]   Fatigue 06/06/2019   Hearing loss 10/15/2018   High risk medication use 07/30/2015   Long-term use of aspirin therapy 10/15/2018   Malaise and fatigue 10/25/2018   Mild cognitive impairment 12/10/2018   Formatting of this note might be different from the original. 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 Added automatically from request for surgery [id number]   Paroxysmal atrial fibrillation (HCC) 05/06/2019   Permanent atrial fibrillation (HCC) 10/08/2018   Persistent atrial fibrillation, status post cardioversion in July 2020 about flip back to atrial fibrillation a few weeks later 01/21/2019   Postlaminectomy syndrome of lumbar region 04/27/2020   Postural dizziness with presyncope 10/25/2018   Prediabetes 07/30/2015   Right shoulder injury 10/08/2018   S/P arthroscopy of right shoulder 03/12/2019   S/P rotator cuff  repair 03/12/2019   SI (sacroiliac) pain 07/27/2020   Tear of right supraspinatus tendon 10/25/2018   Tremor 07/30/2015   Vitamin B 12 deficiency 07/30/2015   White matter disease, unspecified 11/19/2018   Noted on MRI of brain 2020. Discussed with pt and wife.    Past Surgical History:  Procedure Laterality Date   BACK SURGERY     CATARACT EXTRACTION, BILATERAL Bilateral  03/08/2021   03/22/2021   CHOLECYSTECTOMY     COLONOSCOPY  05/26/2016   Dr Jennye Boroughs. Diminutive colon polyp (2)   CORONARY STENT INTERVENTION N/A 02/16/2021   Procedure: CORONARY STENT INTERVENTION;  Surgeon: Corky Crafts, MD;  Location: [facility] INVASIVE CV LAB;  Service: Cardiovascular;  Laterality: N/A;   ESOPHAGOGASTRODUODENOSCOPY  11/04/2014   Dr Jennye Boroughs. Irregular GE junction questionable signicance. Erosive gastritis. Gastric polyps. Hiatal hernia. No obvious esophageal stricture status post empiric esophageal dilatation to 54 French   HERNIA REPAIR  2009   INTRAVASCULAR ULTRASOUND/IVUS N/A 02/16/2021   Procedure: Intravascular Ultrasound/IVUS;  Surgeon: Corky Crafts, MD;  Location: [facility] INVASIVE CV LAB;  Service: Cardiovascular;  Laterality: N/A;   LEFT HEART CATH AND CORONARY ANGIOGRAPHY N/A 10/25/2019   Procedure: LEFT HEART CATH AND CORONARY ANGIOGRAPHY;  Surgeon: [place]az[place], Peter M, MD;  Location: [facility] INVASIVE CV LAB;  Service: Cardiovascular;  Laterality: N/A;   LEFT HEART CATH AND CORONARY ANGIOGRAPHY N/A 02/16/2021   Procedure: LEFT HEART CATH AND CORONARY ANGIOGRAPHY;  Surgeon: Corky Crafts, MD;  Location: [facility] INVASIVE CV LAB;  Service: Cardiovascular;  Laterality: N/A;   ROTATOR CUFF REPAIR  03/07/2019   SHOULDER SURGERY Left 12/2010   Rotater cuff   SPINAL FUSION  1998   Dr Nadene Rubins   [facility] SURGERY  2001   Spinal hardware removal by Dr. Lilian Kapur    Family History  Problem Relation Age of Onset   Dementia Mother    Breast cancer Mother    Hypertension Mother    Hypertension Father    Dementia Maternal Grandmother    Prostate cancer Maternal Grandfather    Multiple sclerosis Son    Colon cancer Neg Hx    Rectal cancer Neg Hx    Stomach cancer Neg Hx    Liver cancer Neg Hx    Pancreatic cancer Neg Hx    Esophageal cancer Neg Hx     Social History   Tobacco Use   Smoking status: Never   Smokeless tobacco: Never  Vaping Use   Vaping Use:  Never used  Substance Use Topics   Alcohol use: Not Currently    Comment: since March 2023   Drug use: Not Currently    Current Outpatient Medications  Medication Sig Dispense Refill   carvedilol (COREG) 3.125 MG tablet TAKE 1 TABLET BY MOUTH TWICE(2) DAILY (Patient taking differently: Take 3.125 mg by mouth 2 (two) times daily with a meal.) 60 tablet 2   Cholecalciferol (VITAMIN D3) 125 MCG (5000 UT) CAPS Take 5,000 Units by mouth daily.      cyclobenzaprine (FLEXERIL) 10 MG tablet Take 1 tablet by mouth 2 (two) times daily as needed.     diltiazem (CARDIZEM CD) 120 MG 24 hr capsule Take 120 mg by mouth daily.      flecainide (TAMBOCOR) 50 MG tablet Take 1 tablet (50 mg total) by mouth 2 (two) times daily. TAKE 1 TABLET BY MOUTH TWICE (2) DAILY 180 tablet 1   PARoxetine (PAXIL) 40 MG tablet Take 40 mg by mouth daily.      pregabalin (  LYRICA) 75 MG capsule Take 2 capsules by mouth daily.     rosuvastatin (CRESTOR) 20 MG tablet TAKE ONE TABLET BY MOUTH EVERY DAY (Patient taking differently: Take 20 mg by mouth daily.) 90 tablet 2   vitamin B-12 (CYANOCOBALAMIN) 1000 MCG tablet Take 1,000 mcg by mouth daily.     apixaban (ELIQUIS) 5 MG TABS tablet Take 1 tablet (5 mg total) by mouth 2 (two) times daily. 60 tablet    clopidogrel (PLAVIX) 75 MG tablet TAKE ONE (1) TABLET BY MOUTH EVERY DAY 90 tablet 3   nitroGLYCERIN (NITROSTAT) 0.4 MG SL tablet Place 1 tablet (0.4 mg total) under the tongue every 5 (five) minutes as needed. (Patient taking differently: Place 0.4 mg under the tongue every 5 (five) minutes as needed for chest pain.) 30 tablet 3   Current Facility-Administered Medications  Medication Dose Route Frequency Provider Last Rate Last Admin   0.9 %  sodium chloride infusion  500 mL Intravenous Once Jackquline Denmark, MD        No Known Allergies  Review of Systems:  Constitutional: Denies fever, chills, diaphoresis, appetite change and fatigue.  HEENT: Denies photophobia, eye pain,  redness, hearing loss, ear pain, congestion, sore throat, rhinorrhea, sneezing, mouth sores, neck pain, neck stiffness and tinnitus.   Respiratory: Denies SOB, DOE, cough, chest tightness,  and wheezing.   Cardiovascular: Denies chest pain, palpitations and leg swelling.  Genitourinary: Denies dysuria, urgency, frequency, hematuria, flank pain and difficulty urinating.  Musculoskeletal: Denies myalgias, back pain, joint swelling, arthralgias and gait problem.  Skin: No rash.  Neurological: Denies dizziness, seizures, syncope, weakness, light-headedness, numbness and headaches.  Hematological: Denies adenopathy. Has Easy bruising. Psychiatric/Behavioral: No anxiety or depression     Physical Exam:    BP 109/69   Pulse (!) 57   Temp (!) 96 F (35.6 C)   Ht 6' (1.829 m)   Wt 232 lb (105.2 kg)   SpO2 96%   BMI 31.46 kg/m  Wt Readings from Last 3 Encounters:  07/04/22 232 lb (105.2 kg)  05/02/22 232 lb 4 oz (105.3 kg)  03/14/22 235 lb (106.6 kg)   Constitutional:  Well-developed, in no acute distress. Psychiatric: Normal mood and affect. Behavior is normal. HEENT: Pupils normal.  Conjunctivae are normal. No scleral icterus. Cardiovascular: Normal rate, regular rhythm. No edema Pulmonary/chest: Effort normal and breath sounds normal. No wheezing, rales or rhonchi. Abdominal: Soft, nondistended. Nontender. Bowel sounds active throughout. There are no masses palpable. No hepatomegaly.  Well-healed surgical scars.  Rectal muscle diastases. Rectal: Deferred Neurological: Alert and oriented to person place and time. Skin: Skin is warm and dry. No rashes noted.  Data Reviewed: I have personally reviewed following labs and imaging studies  CBC:    Latest Ref Rng & Units 04/05/2021    1:19 PM 02/17/2021    2:47 AM 02/16/2021   10:17 AM  CBC  WBC 3.4 - 10.8 x10E3/uL 5.8  8.5  6.8   Hemoglobin 13.0 - 17.7 g/dL 16.3  14.9  14.5   Hematocrit 37.5 - 51.0 % 47.8  43.6  42.6   Platelets  150 - 450 x10E3/uL 241  195  196     CMP:    Latest Ref Rng & Units 08/03/2021    2:44 PM 04/05/2021    1:19 PM 02/17/2021    2:47 AM  CMP  Glucose 70 - 99 mg/dL 99  112  85   BUN 8 - 27 mg/dL 18  23  14  Creatinine 0.76 - 1.27 mg/dL 0.94  0.95  1.07   Sodium 134 - 144 mmol/L 140  144  141   Potassium 3.5 - 5.2 mmol/L 4.8  4.4  3.8   Chloride 96 - 106 mmol/L 105  105  106   CO2 20 - 29 mmol/L 23  24  25    Calcium 8.6 - 10.2 mg/dL 9.1  9.4  8.5   Total Protein 6.0 - 8.5 g/dL  6.9    Total Bilirubin 0.0 - 1.2 mg/dL  0.6    Alkaline Phos 44 - 121 IU/L  71    AST 0 - 40 IU/L  22    ALT 0 - 44 IU/L  22         Carmell Austria, MD 07/04/2022, 8:10 AM  Cc: [name]., MD

## 2022-07-04 NOTE — Op Note (Signed)
[name] Patient Name: Richard Spence Procedure Date: 07/04/2022 8:11 AM MRN: [id number] Endoscopist: Jackquline Denmark , MD, [id number] Age: 67 Referring MD:  Date of Birth: 1955-07-06 Gender: Male Account #: [id number] Procedure:                Colonoscopy Indications:              High risk colon cancer surveillance: Personal                            history of colonic polyps Medicines:                Monitored Anesthesia Care Procedure:                Pre-Anesthesia Assessment:                           - Prior to the procedure, a History and Physical                            was performed, and patient medications and                            allergies were reviewed. The patient's tolerance of                            previous anesthesia was also reviewed. The risks                            and benefits of the procedure and the sedation                            options and risks were discussed with the patient.                            All questions were answered, and informed consent                            was obtained. Prior Anticoagulants: Eliquis was                            held 1 day prior, Plavix 5 days prior. ASA Grade                            Assessment: II - A patient with mild systemic                            disease. After reviewing the risks and benefits,                            the patient was deemed in satisfactory condition to                            undergo the procedure.  After obtaining informed consent, the colonoscope                            was passed under direct vision. Throughout the                            procedure, the patient's blood pressure, pulse, and                            oxygen saturations were monitored continuously. The                            CF [id number] #[id number] was introduced through the anus                            and advanced to the the cecum, identified by                             appendiceal orifice and ileocecal valve. The                            colonoscopy was performed without difficulty. The                            patient tolerated the procedure well. The quality                            of the bowel preparation was good. The terminal                            ileum, ileocecal valve, appendiceal orifice, and                            rectum were photographed. Scope In: 8:15:09 AM Scope Out: 8:27:31 AM Scope Withdrawal Time: 0 hours 7 minutes 38 seconds  Total Procedure Duration: 0 hours 12 minutes 22 seconds  Findings:                 A few medium-mouthed diverticula were found in the                            sigmoid colon and rare in ascending colon and cecum.                           Non-bleeding internal hemorrhoids were found during                            retroflexion. The hemorrhoids were small and Grade                            I (internal hemorrhoids that do not prolapse).                           The terminal ileum appeared normal.  The exam was otherwise without abnormality on                            direct and retroflexion views. Complications:            No immediate complications. Estimated Blood Loss:     Estimated blood loss: none. Impression:               - Mild pancolonic diverticulosis.                           - Non-bleeding internal hemorrhoids.                           - The examined portion of the ileum was normal.                           - The examination was otherwise normal on direct                            and retroflexion views.                           - No specimens collected. Recommendation:           - Patient has a contact number available for                            emergencies. The signs and symptoms of potential                            delayed complications were discussed with the                            patient. Return to normal  activities tomorrow.                            Written discharge instructions were provided to the                            patient.                           - Resume previous diet.                           - Continue present medications.                           - Repeat colonoscopy is not recommended for                            screening purposes. Hence repeat colonoscopy only                            if with any new problems or if clinically indicated                           -  The findings and recommendations were discussed                            with the patient's family.                           - Resume Eliquis (apixaban) and Plavix today at                            prior dose. Lynann Bologna, MD 07/04/2022 8:33:10 AM This report has been signed electronically.

## 2022-07-04 NOTE — Patient Instructions (Addendum)
Resume previous medications, including plavix and eliquis TODAY at previous doses.  There were no polyps seen today!  You will NOT need another screening colonoscopy!  However, please call us at [phone] if you have a change in bowel habits, change in family history of colo-rectal cancer, rectal bleeding or other GI concern in the future! Information given re: diverticulosis.  YOU HAD AN ENDOSCOPIC PROCEDURE TODAY AT [facility]:   Refer to the procedure report that was given to you for any specific questions about what was found during the examination.  If the procedure report does not answer your questions, please call your gastroenterologist to clarify.  If you requested that your care partner not be given the details of your procedure findings, then the procedure report has been included in a sealed envelope for you to review at your convenience later.  YOU SHOULD EXPECT: Some feelings of bloating in the abdomen. Passage of more gas than usual.  Walking can help get rid of the air that was put into your GI tract during the procedure and reduce the bloating. If you had a lower endoscopy (such as a colonoscopy or flexible sigmoidoscopy) you may notice spotting of blood in your stool or on the toilet paper. If you underwent a bowel prep for your procedure, you may not have a normal bowel movement for a few days.  Please Note:  You might notice some irritation and congestion in your nose or some drainage.  This is from the oxygen used during your procedure.  There is no need for concern and it should clear up in a day or so.  SYMPTOMS TO REPORT IMMEDIATELY:  Following lower endoscopy (colonoscopy):  Excessive amounts of blood in the stool  Significant tenderness or worsening of abdominal pains  Swelling of the abdomen that is new, acute  Fever of 100F or higher  For urgent or emergent issues, a gastroenterologist can be reached at any hour by calling [phone]. Do not  use MyChart messaging for urgent concerns.   DIET:  We do recommend a small meal at first, but then you may proceed to your regular diet.  Drink plenty of fluids but you should avoid alcoholic beverages for 24 hours.  ACTIVITY:  You should plan to take it easy for the rest of today and you should NOT DRIVE or use heavy machinery until tomorrow (because of the sedation medicines used during the test).    FOLLOW UP: Our staff will call the number listed on your records the next business day following your procedure.  We will call around 7:15- 8:00 am to check on you and address any questions or concerns that you may have regarding the information given to you following your procedure. If we do not reach you, we will leave a message.     SIGNATURES/CONFIDENTIALITY: You and/or your care partner have signed paperwork which will be entered into your electronic medical record.  These signatures attest to the fact that that the information above on your After Visit Summary has been reviewed and is understood.  Full responsibility of the confidentiality of this discharge information lies with you and/or your care-partner.

## 2022-07-05 ENCOUNTER — Telehealth: Payer: Self-pay

## 2022-07-05 NOTE — Telephone Encounter (Signed)
Left message on answering machine. 

## 2022-07-07 ENCOUNTER — Ambulatory Visit: Payer: Medicare HMO | Admitting: Podiatry

## 2022-07-07 ENCOUNTER — Encounter: Payer: Self-pay | Admitting: Podiatry

## 2022-07-07 ENCOUNTER — Ambulatory Visit (INDEPENDENT_AMBULATORY_CARE_PROVIDER_SITE_OTHER): Payer: Medicare HMO

## 2022-07-07 DIAGNOSIS — M2012 Hallux valgus (acquired), left foot: Secondary | ICD-10-CM | POA: Diagnosis not present

## 2022-07-07 DIAGNOSIS — M778 Other enthesopathies, not elsewhere classified: Secondary | ICD-10-CM

## 2022-07-07 MED ORDER — TRIAMCINOLONE ACETONIDE 40 MG/ML IJ SUSP
20.0000 mg | Freq: Once | INTRAMUSCULAR | Status: AC
  Administered 2022-07-07: 20 mg

## 2022-07-07 NOTE — Progress Notes (Signed)
Subjective:  Patient ID: Richard Spence, male    DOB: [birth year],  MRN: [id number] HPI Chief Complaint  Patient presents with   Foot Pain    Dorsal forefoot left - aching x 1 month, no injury, swelling, tried Tylenol, severe bunion deformity   New Patient (Initial Visit)    67 y.o. male presents with the above complaint.   ROS: Denies fever chills nausea vomit muscle aches pains calf pain back pain chest pain shortness of breath.  Past Medical History:  Diagnosis Date   Abnormal stress test 10/21/2019   Acute pain of left knee 04/14/2020   Altered mental status 10/25/2018   Atrial fibrillation with rapid ventricular response ([place]) 10/08/2018   Atrial fibrillation with rapid ventricular response ([place]) 10/08/2018   Cardiomyopathy ([place]) 10/25/2018   EF 25-30% on ST. 40% on ECHO 2020.  LVH   Chronic pain syndrome 04/27/2020   Coronary artery disease of native artery of native heart with stable angina pectoris ([place]) 12/02/2019   Formatting of this note might be different from the original. 10/2019. Cath at [facility] LAD 45%. EF 55-60%   Degeneration of lumbar intervertebral disc 07/30/2015   DOE (dyspnea on exertion) 10/16/2018   Facet arthropathy, cervical 01/30/2020   Formatting of this note might be different from the original. Added automatically from request for surgery [id number]  Formatting of this note might be different from the original. Added automatically from request for surgery [id number]   Fatigue 06/06/2019   Hearing loss 10/15/2018   High risk medication use 07/30/2015   Long-term use of aspirin therapy 10/15/2018   Malaise and fatigue 10/25/2018   Mild cognitive impairment 12/10/2018   Formatting of this note might be different from the original. Okay on eval   Mild episode of recurrent major depressive disorder ([place]) 07/30/2015   Last Assessment & Plan:  Relevant Hx: Course: Daily Update: Today's Plan:will refill his med for him today and he is stable with this  Electronically signed by:  Baldemar Friday, FNP 07/30/15 [id number]   Mixed hyperlipidemia 07/30/2015   Pain of cervical facet joint 01/30/2020   Formatting of this note might be different from the original. Added automatically from request for surgery [id number]   Paroxysmal atrial fibrillation ([place]) 05/06/2019   Permanent atrial fibrillation ([place]) 10/08/2018   Persistent atrial fibrillation, status post cardioversion in July 2020 about flip back to atrial fibrillation a few weeks later 01/21/2019   Postlaminectomy syndrome of lumbar region 04/27/2020   Postural dizziness with presyncope 10/25/2018   Prediabetes 07/30/2015   Right shoulder injury 10/08/2018   S/P arthroscopy of right shoulder 03/12/2019   S/P rotator cuff repair 03/12/2019   SI (sacroiliac) pain 07/27/2020   Tear of right supraspinatus tendon 10/25/2018   Tremor 07/30/2015   Vitamin B 12 deficiency 07/30/2015   White matter disease, unspecified 11/19/2018   Noted on MRI of brain 2020. Discussed with pt and wife.   Past Surgical History:  Procedure Laterality Date   BACK SURGERY     CATARACT EXTRACTION, BILATERAL Bilateral 03/08/2021   03/22/2021   CHOLECYSTECTOMY     COLONOSCOPY  05/26/2016   Dr Lyda Jester. Diminutive colon polyp (2)   CORONARY STENT INTERVENTION N/A 02/16/2021   Procedure: CORONARY STENT INTERVENTION;  Surgeon: Jettie Booze, MD;  Location: [place] [facility];  Service: Cardiovascular;  Laterality: N/A;   ESOPHAGOGASTRODUODENOSCOPY  11/04/2014   Dr Lyda Jester. Irregular GE junction questionable signicance. Erosive gastritis. Gastric polyps. Hiatal hernia. No obvious esophageal stricture status post  empiric esophageal dilatation to 68 French   HERNIA REPAIR  2009   INTRAVASCULAR ULTRASOUND/IVUS N/A 02/16/2021   Procedure: Intravascular Ultrasound/IVUS;  Surgeon: Jettie Booze, MD;  Location: [place] [facility];  Service: Cardiovascular;  Laterality: N/A;   LEFT HEART CATH AND CORONARY ANGIOGRAPHY N/A 10/25/2019   Procedure:  LEFT HEART CATH AND CORONARY ANGIOGRAPHY;  Surgeon: [name], Peter M, MD;  Location: [place] [facility];  Service: Cardiovascular;  Laterality: N/A;   LEFT HEART CATH AND CORONARY ANGIOGRAPHY N/A 02/16/2021   Procedure: LEFT HEART CATH AND CORONARY ANGIOGRAPHY;  Surgeon: Jettie Booze, MD;  Location: [place] [facility];  Service: Cardiovascular;  Laterality: N/A;   ROTATOR CUFF REPAIR  03/07/2019   SHOULDER SURGERY Left 12/2010   Rotater cuff   SPINAL FUSION  1998   Dr [place]  2001   Spinal hardware removal by Dr. Sherryle Lis    Current Outpatient Medications:    apixaban (ELIQUIS) 5 MG TABS tablet, Take 1 tablet (5 mg total) by mouth 2 (two) times daily., Disp: 60 tablet, Rfl:    carvedilol (COREG) 3.125 MG tablet, TAKE 1 TABLET BY MOUTH TWICE(2) DAILY (Patient taking differently: Take 3.125 mg by mouth 2 (two) times daily with a meal.), Disp: 60 tablet, Rfl: 2   Cholecalciferol (VITAMIN D3) 125 MCG (5000 UT) CAPS, Take 5,000 Units by mouth daily. , Disp: , Rfl:    clopidogrel (PLAVIX) 75 MG tablet, TAKE ONE (1) TABLET BY MOUTH EVERY DAY, Disp: 90 tablet, Rfl: 3   cyclobenzaprine (FLEXERIL) 10 MG tablet, Take 1 tablet by mouth 2 (two) times daily as needed., Disp: , Rfl:    diltiazem (CARDIZEM CD) 120 MG 24 hr capsule, Take 120 mg by mouth daily. , Disp: , Rfl:    flecainide (TAMBOCOR) 50 MG tablet, Take 1 tablet (50 mg total) by mouth 2 (two) times daily. TAKE 1 TABLET BY MOUTH TWICE (2) DAILY, Disp: 180 tablet, Rfl: 1   nitroGLYCERIN (NITROSTAT) 0.4 MG SL tablet, Place 1 tablet (0.4 mg total) under the tongue every 5 (five) minutes as needed. (Patient taking differently: Place 0.4 mg under the tongue every 5 (five) minutes as needed for chest pain.), Disp: 30 tablet, Rfl: 3   PARoxetine (PAXIL) 40 MG tablet, Take 40 mg by mouth daily. , Disp: , Rfl:    pregabalin (LYRICA) 75 MG capsule, Take 2 capsules by mouth daily., Disp: , Rfl:    rosuvastatin (CRESTOR) 20 MG tablet,  TAKE ONE TABLET BY MOUTH EVERY DAY (Patient taking differently: Take 20 mg by mouth daily.), Disp: 90 tablet, Rfl: 2   vitamin B-12 (CYANOCOBALAMIN) 1000 MCG tablet, Take 1,000 mcg by mouth daily., Disp: , Rfl:   No Known Allergies Review of Systems Objective:  There were no vitals filed for this visit.  General: Well developed, nourished, in no acute distress, alert and oriented x3   Dermatological: Skin is warm, dry and supple bilateral. Nails x 10 are well maintained; remaining integument appears unremarkable at this time. There are no open sores, no preulcerative lesions, no rash or signs of infection present.  Vascular: Dorsalis Pedis artery and Posterior Tibial artery pedal pulses are 2/4 bilateral with immedate capillary fill time. Pedal hair growth present. No varicosities and no lower extremity edema present bilateral.   Neruologic: Grossly intact via light touch bilateral. Vibratory intact via tuning fork bilateral. Protective threshold with Semmes Wienstein monofilament intact to all pedal sites bilateral. Patellar and Achilles deep tendon reflexes  2+ bilateral. No Babinski or clonus noted bilateral.   Musculoskeletal: No gross boney pedal deformities bilateral. No pain, crepitus, or limitation noted with foot and ankle range of motion bilateral. Muscular strength 5/5 in all groups tested bilateral.  Moderate hallux valgus deformity first metatarsal phalangeal joint left foot with no crepitation on range of motion.  Nontender on palpation.  He does have tenderness with mild edema overlying the second third fourth metatarsophalangeal joints with pain on end range of motion dorsally and plantarly.  Gait: Unassisted, Nonantalgic.    Radiographs:  Radiographs taken today demonstrate a osseously mature individual with moderate to severe hallux valgus deformity of the left foot.  No fractures are identified.  Elongated second and third metatarsals of the left foot.  Good  mineralization.    Assessment & Plan:   Assessment: Hallux valgus deformity.  Capsulitis of the second metatarsal third metatarsal and fourth metatarsal phalangeal joints.  Plan: I injected these areas today with 10 mg Kenalog 5 mg Marcaine 2 injections were provided 1 between the second and third metatarsal and 1 between the third and fourth metatarsal phalangeal joints.  He tolerated this well and placed him in a Darco shoe.  His wife is questioning whether surgery to the bunion would benefit him also questioning as to whether or not the shoe would cause more back pain.  I instructed them that try to wear a shoe of similar height on the contralateral foot and also that we have to try conservative therapies before jumping into surgical correction.     Keifer Habib T. [place], [place]

## 2022-07-27 ENCOUNTER — Emergency Department (HOSPITAL_BASED_OUTPATIENT_CLINIC_OR_DEPARTMENT_OTHER): Payer: Medicare HMO

## 2022-07-27 ENCOUNTER — Other Ambulatory Visit: Payer: Self-pay

## 2022-07-27 ENCOUNTER — Telehealth: Payer: Self-pay | Admitting: Cardiology

## 2022-07-27 ENCOUNTER — Inpatient Hospital Stay (HOSPITAL_BASED_OUTPATIENT_CLINIC_OR_DEPARTMENT_OTHER)
Admission: EM | Admit: 2022-07-27 | Discharge: 2022-07-30 | DRG: 322 | Disposition: A | Discharge status: Home / Self Care | Payer: Medicare HMO | Attending: Family Medicine | Admitting: Family Medicine

## 2022-07-27 ENCOUNTER — Encounter (HOSPITAL_BASED_OUTPATIENT_CLINIC_OR_DEPARTMENT_OTHER): Payer: Self-pay | Admitting: Pediatrics

## 2022-07-27 DIAGNOSIS — G894 Chronic pain syndrome: Secondary | ICD-10-CM | POA: Diagnosis present

## 2022-07-27 DIAGNOSIS — I429 Cardiomyopathy, unspecified: Secondary | ICD-10-CM | POA: Diagnosis not present

## 2022-07-27 DIAGNOSIS — F39 Unspecified mood [affective] disorder: Secondary | ICD-10-CM

## 2022-07-27 DIAGNOSIS — Z820 Family history of epilepsy and other diseases of the nervous system: Secondary | ICD-10-CM | POA: Diagnosis not present

## 2022-07-27 DIAGNOSIS — R001 Bradycardia, unspecified: Secondary | ICD-10-CM

## 2022-07-27 DIAGNOSIS — Z981 Arthrodesis status: Secondary | ICD-10-CM | POA: Diagnosis not present

## 2022-07-27 DIAGNOSIS — E782 Mixed hyperlipidemia: Principal | ICD-10-CM | POA: Diagnosis present

## 2022-07-27 DIAGNOSIS — Z9049 Acquired absence of other specified parts of digestive tract: Secondary | ICD-10-CM

## 2022-07-27 DIAGNOSIS — D6869 Other thrombophilia: Secondary | ICD-10-CM | POA: Diagnosis not present

## 2022-07-27 DIAGNOSIS — I708 Atherosclerosis of other arteries: Secondary | ICD-10-CM | POA: Diagnosis present

## 2022-07-27 DIAGNOSIS — I25110 Atherosclerotic heart disease of native coronary artery with unstable angina pectoris: Secondary | ICD-10-CM | POA: Diagnosis not present

## 2022-07-27 DIAGNOSIS — Z803 Family history of malignant neoplasm of breast: Secondary | ICD-10-CM | POA: Diagnosis not present

## 2022-07-27 DIAGNOSIS — Z955 Presence of coronary angioplasty implant and graft: Secondary | ICD-10-CM

## 2022-07-27 DIAGNOSIS — Z8042 Family history of malignant neoplasm of prostate: Secondary | ICD-10-CM

## 2022-07-27 DIAGNOSIS — R079 Chest pain, unspecified: Secondary | ICD-10-CM | POA: Diagnosis present

## 2022-07-27 DIAGNOSIS — I2510 Atherosclerotic heart disease of native coronary artery without angina pectoris: Secondary | ICD-10-CM

## 2022-07-27 DIAGNOSIS — I200 Unstable angina: Principal | ICD-10-CM | POA: Diagnosis present

## 2022-07-27 DIAGNOSIS — H9190 Unspecified hearing loss, unspecified ear: Secondary | ICD-10-CM | POA: Diagnosis present

## 2022-07-27 DIAGNOSIS — I4821 Permanent atrial fibrillation: Secondary | ICD-10-CM | POA: Diagnosis present

## 2022-07-27 DIAGNOSIS — Z7901 Long term (current) use of anticoagulants: Secondary | ICD-10-CM

## 2022-07-27 DIAGNOSIS — Z9861 Coronary angioplasty status: Secondary | ICD-10-CM

## 2022-07-27 DIAGNOSIS — G8929 Other chronic pain: Secondary | ICD-10-CM | POA: Insufficient documentation

## 2022-07-27 DIAGNOSIS — Z7902 Long term (current) use of antithrombotics/antiplatelets: Secondary | ICD-10-CM | POA: Diagnosis not present

## 2022-07-27 DIAGNOSIS — Z79899 Other long term (current) drug therapy: Secondary | ICD-10-CM

## 2022-07-27 DIAGNOSIS — R7303 Prediabetes: Secondary | ICD-10-CM | POA: Diagnosis not present

## 2022-07-27 DIAGNOSIS — E785 Hyperlipidemia, unspecified: Secondary | ICD-10-CM | POA: Insufficient documentation

## 2022-07-27 DIAGNOSIS — Z8249 Family history of ischemic heart disease and other diseases of the circulatory system: Secondary | ICD-10-CM | POA: Diagnosis not present

## 2022-07-27 DIAGNOSIS — I480 Paroxysmal atrial fibrillation: Secondary | ICD-10-CM | POA: Diagnosis present

## 2022-07-27 LAB — BASIC METABOLIC PANEL
Anion gap: 5 (ref 5–15)
BUN: 23 mg/dL (ref 8–23)
CO2: 25 mmol/L (ref 22–32)
Calcium: 8.8 mg/dL — ABNORMAL LOW (ref 8.9–10.3)
Chloride: 106 mmol/L (ref 98–111)
Creatinine, Ser: 1.01 mg/dL (ref 0.61–1.24)
GFR, Estimated: >60 mL/min (ref >60)
Glucose, Bld: 94 mg/dL (ref 70–99)
Potassium: 3.8 mmol/L (ref 3.5–5.1)
Sodium: 136 mmol/L (ref 135–145)

## 2022-07-27 LAB — CBC
HCT: 47.4 % (ref 39.0–52.0)
Hemoglobin: 16.2 g/dL (ref 13.0–17.0)
MCH: 30.6 pg (ref 26.0–34.0)
MCHC: 34.2 g/dL (ref 30.0–36.0)
MCV: 89.4 fL (ref 80.0–100.0)
Platelets: 216 10*3/uL (ref 150–400)
RBC: 5.3 MIL/uL (ref 4.22–5.81)
RDW: 13.5 % (ref 11.5–15.5)
WBC: 7 10*3/uL (ref 4.0–10.5)
nRBC: 0 % (ref 0.0–0.2)

## 2022-07-27 LAB — TROPONIN I (HIGH SENSITIVITY)
Troponin I (High Sensitivity): 3 ng/L (ref <18)
Troponin I (High Sensitivity): 3 ng/L (ref <18)

## 2022-07-27 MED ORDER — CARVEDILOL 3.125 MG PO TABS
3.1250 mg | ORAL_TABLET | Freq: Two times a day (BID) | ORAL | Status: DC
  Administered 2022-07-27: 3.125 mg via ORAL
  Filled 2022-07-27: qty 1

## 2022-07-27 MED ORDER — ROSUVASTATIN CALCIUM 20 MG PO TABS
20.0000 mg | ORAL_TABLET | Freq: Every day | ORAL | Status: DC
  Administered 2022-07-28 – 2022-07-30 (×3): 20 mg via ORAL
  Filled 2022-07-27 (×3): qty 1

## 2022-07-27 MED ORDER — PREGABALIN 100 MG PO CAPS
100.0000 mg | ORAL_CAPSULE | Freq: Every day | ORAL | Status: DC
  Administered 2022-07-27 – 2022-07-30 (×4): 100 mg via ORAL
  Filled 2022-07-27 (×4): qty 1

## 2022-07-27 MED ORDER — NITROGLYCERIN 0.4 MG SL SUBL
0.4000 mg | SUBLINGUAL_TABLET | SUBLINGUAL | Status: DC | PRN
  Administered 2022-07-27 (×2): 0.4 mg via SUBLINGUAL
  Filled 2022-07-27 (×2): qty 1

## 2022-07-27 MED ORDER — FLECAINIDE ACETATE 50 MG PO TABS
50.0000 mg | ORAL_TABLET | Freq: Two times a day (BID) | ORAL | Status: DC
  Administered 2022-07-27: 50 mg via ORAL
  Filled 2022-07-27: qty 1

## 2022-07-27 MED ORDER — APIXABAN 5 MG PO TABS
5.0000 mg | ORAL_TABLET | Freq: Two times a day (BID) | ORAL | Status: DC
  Administered 2022-07-27: 5 mg via ORAL
  Filled 2022-07-27: qty 2

## 2022-07-27 NOTE — ED Provider Notes (Signed)
[facility] Provider Note   CSN: [id number] Arrival date & time: 07/27/22  1217     History  Chief Complaint  Patient presents with   Chest Pain   Shortness of Breath    Richard Spence is a 67 y.o. male.  Pt is a 67 yo male with pmhx significant for CAD (s/p stent to the LAD), afib (on Eliquis), hoh, depression, and hld.  Pt has been having intermittent cp for the past few months.  Now, it is all the time.  He has not told anyone until today.  His wife called Dr. Marthann Schiller office, and they were told to come to the ED.          Home Medications Prior to Admission medications   Medication Sig Start Date End Date Taking? Authorizing Provider  apixaban (ELIQUIS) 5 MG TABS tablet Take 1 tablet (5 mg total) by mouth 2 (two) times daily. 10/26/19   [name], Peter M, MD  carvedilol (COREG) 3.125 MG tablet TAKE 1 TABLET BY MOUTH TWICE(2) DAILY Patient taking differently: Take 3.125 mg by mouth 2 (two) times daily with a meal. 08/04/21   Park Liter, MD  Cholecalciferol (VITAMIN D3) 125 MCG (5000 UT) CAPS Take 5,000 Units by mouth daily.     [provider]  clopidogrel (PLAVIX) 75 MG tablet TAKE ONE (1) TABLET BY MOUTH EVERY DAY 04/22/22   Park Liter, MD  cyclobenzaprine (FLEXERIL) 10 MG tablet Take 1 tablet by mouth 2 (two) times daily as needed. 04/11/22   [provider]  diltiazem (CARDIZEM CD) 120 MG 24 hr capsule Take 120 mg by mouth daily.  11/01/18 07/04/22  [provider]  flecainide (TAMBOCOR) 50 MG tablet Take 1 tablet (50 mg total) by mouth 2 (two) times daily. TAKE 1 TABLET BY MOUTH TWICE (2) DAILY 02/28/22   Park Liter, MD  nitroGLYCERIN (NITROSTAT) 0.4 MG SL tablet Place 1 tablet (0.4 mg total) under the tongue every 5 (five) minutes as needed. Patient taking differently: Place 0.4 mg under the tongue every 5 (five) minutes as needed for chest pain. 02/01/21 10/19/21  Park Liter,  MD  PARoxetine (PAXIL) 40 MG tablet Take 40 mg by mouth daily.  10/10/18   [provider]  pregabalin (LYRICA) 75 MG capsule Take 2 capsules by mouth daily. 09/15/21   [provider]  rosuvastatin (CRESTOR) 20 MG tablet TAKE ONE TABLET BY MOUTH EVERY DAY Patient taking differently: Take 20 mg by mouth daily. 06/02/21   Park Liter, MD  vitamin B-12 (CYANOCOBALAMIN) 1000 MCG tablet Take 1,000 mcg by mouth daily.    [provider]      Allergies    Patient has no known allergies.    Review of Systems   Review of Systems  Respiratory:  Positive for shortness of breath.   Cardiovascular:  Positive for chest pain.  All other systems reviewed and are negative.   Physical Exam Updated Vital Signs BP 122/81 (BP Location: Left Arm)   Pulse 69   Temp 98.9 F (37.2 C) (Oral)   Resp 20   Ht 6' (1.829 m)   Wt 104.3 kg   SpO2 98%   BMI 31.19 kg/m  Physical Exam Vitals and nursing note reviewed.  Constitutional:      Appearance: He is well-developed.  HENT:     Head: Normocephalic and atraumatic.  Eyes:     Extraocular Movements: Extraocular movements intact.  Pupils: Pupils are equal, round, and reactive to light.  Cardiovascular:     Rate and Rhythm: Normal rate and regular rhythm.     Heart sounds: Normal heart sounds.  Pulmonary:     Effort: Pulmonary effort is normal.     Breath sounds: Normal breath sounds.  Abdominal:     General: Bowel sounds are normal.     Palpations: Abdomen is soft.  Musculoskeletal:        General: Normal range of motion.     Cervical back: Normal range of motion and neck supple.  Skin:    General: Skin is warm.     Capillary Refill: Capillary refill takes less than 2 seconds.  Neurological:     General: No focal deficit present.     Mental Status: He is alert and oriented to person, place, and time.  Psychiatric:        Mood and Affect: Mood normal.        Behavior: Behavior normal.     ED Results /  Procedures / Treatments   Labs (all labs ordered are listed, but only abnormal results are displayed) Labs Reviewed  BASIC METABOLIC PANEL - Abnormal; Notable for the following components:      Result Value   Calcium 8.8 (*)    All other components within normal limits  CBC  TROPONIN I (HIGH SENSITIVITY)  TROPONIN I (HIGH SENSITIVITY)    EKG EKG Interpretation  Date/Time:  Wednesday July 27 2022 12:29:16 EST Ventricular Rate:  68 PR Interval:  168 QRS Duration: 95 QT Interval:  374 QTC Calculation: 398 R Axis:   -13 Text Interpretation: Sinus rhythm Low voltage, precordial leads Consider anterior infarct No significant change since last tracing Confirmed by Isla Pence [phone]) on 07/27/2022 12:30:24 PM  Radiology DG Chest 2 View  Result Date: 07/27/2022 CLINICAL DATA:  Chest pain EXAM: CHEST - 2 VIEW COMPARISON:  Radiograph 02/15/2021 FINDINGS: Unchanged cardiomediastinal silhouette. There is no focal airspace consolidation. There is no pleural effusion or evidence of pneumothorax. No acute osseous abnormality. Thoracic spondylosis. IMPRESSION: No evidence of acute cardiopulmonary disease. Electronically Signed   By: Maurine Simmering M.D.   On: 07/27/2022 13:29    Procedures Procedures    Medications Ordered in ED Medications  nitroGLYCERIN (NITROSTAT) SL tablet 0.4 mg (0.4 mg Sublingual Given 07/27/22 1353)    ED Course/ Medical Decision Making/ A&P                             Medical Decision Making Amount and/or Complexity of Data Reviewed Labs: ordered. Radiology: ordered.  Risk Prescription drug management. Decision regarding hospitalization.   This patient presents to the ED for concern of cp, this involves an extensive number of treatment options, and is a complaint that carries with it a high risk of complications and morbidity.  The differential diagnosis includes cardiac, pulm, gi, msk   Co morbidities that complicate the patient evaluation  CAD  (s/p stent to the LAD), afib (on Eliquis), hoh, depression, and hld   Additional history obtained:  Additional history obtained from epic chart review External records from outside source obtained and reviewed including wife   Lab Tests:  I Ordered, and personally interpreted labs.  The pertinent results include:  cbc nl, bmp nl, trop nl   Imaging Studies ordered:  I ordered imaging studies including cxr  I independently visualized and interpreted imaging which showed No evidence of acute cardiopulmonary  disease.  I agree with the radiologist interpretation   Cardiac Monitoring:  The patient was maintained on a cardiac monitor.  I personally viewed and interpreted the cardiac monitored which showed an underlying rhythm of: nsr   Medicines ordered and prescription drug management:  I ordered medication including nitro  for cp  Reevaluation of the patient after these medicines showed that the patient improved I have reviewed the patients home medicines and have made adjustments as needed  Consultations Obtained:  I requested consultation with the cardiologist (Dr. [name]),  and discussed lab and imaging findings as well as pertinent plan -he recommended admission to [facility] for cardiac r/o. Pt d/w Dr. Roosevelt Locks ([facility]) for admission   Problem List / ED Course:  Unstable angina:  trop nl so far, but due to significant pmhx, he will be sent to [facility] for further eval.   Reevaluation:  After the interventions noted above, I reevaluated the patient and found that they have :improved   Social Determinants of Health:  Lives at home   Dispostion:  After consideration of the diagnostic results and the patients response to treatment, I feel that the patent would benefit from admission.          Final Clinical Impression(s) / ED Diagnoses Final diagnoses:  Unstable angina [facility])    Rx / DC Orders ED Discharge Orders     None         Isla Pence, MD 07/27/22 1404

## 2022-07-27 NOTE — ED Triage Notes (Signed)
C/O chest pain and shortness of breath; reports hx of stent placement back in 02/2021. Reports on eliqhis and plavix.

## 2022-07-27 NOTE — Telephone Encounter (Signed)
Received a stat call from the patient's wife. Patient is having chest tightness and SOB and his chest is very sore. I tried to get them to go to the ER and the wife refused stating they had a bad experience the last time they went to the ER. She would like an appoint ASAP for him to be seen in the office. Sent a message to Dr. [name] to see if he could see the patient at [facility]. Dr. [name] sent a message back to have the patient go to the ER at the [facility]. I informed the patient's wife regarding Dr. Wendy Poet recommendation to go to the ER and she stated that she would take the patient to ER.

## 2022-07-27 NOTE — Telephone Encounter (Signed)
Pt c/o of Chest Pain: STAT if CP now or developed within 24 hours  1. Are you having CP right now? Yes  2. Are you experiencing any other symptoms (ex. SOB, nausea, vomiting, sweating)? SOB, Feeling Off  3. How long have you been experiencing CP? Yesterday  4. Is your CP continuous or coming and going? Continuous chest tightness  5. Have you taken Nitroglycerin? No ?

## 2022-07-27 NOTE — ED Notes (Signed)
Called [facility] for transport @2010$ 

## 2022-07-28 ENCOUNTER — Observation Stay (HOSPITAL_COMMUNITY): Payer: Medicare HMO

## 2022-07-28 DIAGNOSIS — D6869 Other thrombophilia: Secondary | ICD-10-CM | POA: Diagnosis present

## 2022-07-28 DIAGNOSIS — Z9049 Acquired absence of other specified parts of digestive tract: Secondary | ICD-10-CM | POA: Diagnosis not present

## 2022-07-28 DIAGNOSIS — I480 Paroxysmal atrial fibrillation: Secondary | ICD-10-CM

## 2022-07-28 DIAGNOSIS — R001 Bradycardia, unspecified: Secondary | ICD-10-CM

## 2022-07-28 DIAGNOSIS — Z7901 Long term (current) use of anticoagulants: Secondary | ICD-10-CM | POA: Diagnosis not present

## 2022-07-28 DIAGNOSIS — I4821 Permanent atrial fibrillation: Secondary | ICD-10-CM | POA: Diagnosis present

## 2022-07-28 DIAGNOSIS — F39 Unspecified mood [affective] disorder: Secondary | ICD-10-CM

## 2022-07-28 DIAGNOSIS — Z9861 Coronary angioplasty status: Secondary | ICD-10-CM | POA: Diagnosis not present

## 2022-07-28 DIAGNOSIS — R079 Chest pain, unspecified: Secondary | ICD-10-CM

## 2022-07-28 DIAGNOSIS — Z5181 Encounter for therapeutic drug level monitoring: Secondary | ICD-10-CM | POA: Diagnosis not present

## 2022-07-28 DIAGNOSIS — H9190 Unspecified hearing loss, unspecified ear: Secondary | ICD-10-CM | POA: Diagnosis present

## 2022-07-28 DIAGNOSIS — I708 Atherosclerosis of other arteries: Secondary | ICD-10-CM | POA: Diagnosis present

## 2022-07-28 DIAGNOSIS — I25110 Atherosclerotic heart disease of native coronary artery with unstable angina pectoris: Secondary | ICD-10-CM | POA: Diagnosis present

## 2022-07-28 DIAGNOSIS — Z79899 Other long term (current) drug therapy: Secondary | ICD-10-CM | POA: Diagnosis not present

## 2022-07-28 DIAGNOSIS — Z820 Family history of epilepsy and other diseases of the nervous system: Secondary | ICD-10-CM | POA: Diagnosis not present

## 2022-07-28 DIAGNOSIS — E782 Mixed hyperlipidemia: Secondary | ICD-10-CM | POA: Diagnosis present

## 2022-07-28 DIAGNOSIS — I209 Angina pectoris, unspecified: Secondary | ICD-10-CM | POA: Diagnosis not present

## 2022-07-28 DIAGNOSIS — I200 Unstable angina: Principal | ICD-10-CM | POA: Diagnosis present

## 2022-07-28 DIAGNOSIS — I429 Cardiomyopathy, unspecified: Secondary | ICD-10-CM | POA: Diagnosis present

## 2022-07-28 DIAGNOSIS — Z981 Arthrodesis status: Secondary | ICD-10-CM | POA: Diagnosis not present

## 2022-07-28 DIAGNOSIS — Z7902 Long term (current) use of antithrombotics/antiplatelets: Secondary | ICD-10-CM | POA: Diagnosis not present

## 2022-07-28 DIAGNOSIS — G894 Chronic pain syndrome: Secondary | ICD-10-CM | POA: Diagnosis present

## 2022-07-28 DIAGNOSIS — Z955 Presence of coronary angioplasty implant and graft: Secondary | ICD-10-CM | POA: Diagnosis not present

## 2022-07-28 DIAGNOSIS — I2510 Atherosclerotic heart disease of native coronary artery without angina pectoris: Secondary | ICD-10-CM

## 2022-07-28 DIAGNOSIS — R7303 Prediabetes: Secondary | ICD-10-CM | POA: Diagnosis present

## 2022-07-28 DIAGNOSIS — Z803 Family history of malignant neoplasm of breast: Secondary | ICD-10-CM | POA: Diagnosis not present

## 2022-07-28 DIAGNOSIS — Z8042 Family history of malignant neoplasm of prostate: Secondary | ICD-10-CM | POA: Diagnosis not present

## 2022-07-28 DIAGNOSIS — Z8249 Family history of ischemic heart disease and other diseases of the circulatory system: Secondary | ICD-10-CM | POA: Diagnosis not present

## 2022-07-28 HISTORY — DX: Bradycardia, unspecified: R00.1

## 2022-07-28 HISTORY — DX: Unspecified mood (affective) disorder: F39

## 2022-07-28 HISTORY — DX: Unstable angina: I20.0

## 2022-07-28 LAB — ECHOCARDIOGRAM COMPLETE
AV Mean grad: 4 mmHg
AV Peak grad: 8.8 mmHg
Ao pk vel: 1.48 m/s
Area-P 1/2: 3.37 cm2
Height: 72 in
Weight: 3680 oz

## 2022-07-28 LAB — HEPARIN LEVEL (UNFRACTIONATED): Heparin Unfractionated: >1.1 IU/mL — ABNORMAL HIGH (ref 0.30–0.70)

## 2022-07-28 LAB — TROPONIN I (HIGH SENSITIVITY)
Troponin I (High Sensitivity): 4 ng/L (ref <18)
Troponin I (High Sensitivity): 5 ng/L (ref <18)
Troponin I (High Sensitivity): 3 ng/L (ref <18)

## 2022-07-28 LAB — HIV ANTIBODY (ROUTINE TESTING W REFLEX): HIV Screen 4th Generation wRfx: NONREACTIVE

## 2022-07-28 LAB — HEMOGLOBIN A1C
Hgb A1c MFr Bld: 5.3 % (ref 4.8–5.6)
Mean Plasma Glucose: 105.41 mg/dL

## 2022-07-28 LAB — APTT: aPTT: 60 seconds — ABNORMAL HIGH (ref 24–36)

## 2022-07-28 MED ORDER — PERFLUTREN LIPID MICROSPHERE
1.0000 mL | INTRAVENOUS | Status: AC | PRN
  Administered 2022-07-28: 4 mL via INTRAVENOUS

## 2022-07-28 MED ORDER — SODIUM CHLORIDE 0.9% FLUSH
3.0000 mL | Freq: Two times a day (BID) | INTRAVENOUS | Status: DC
  Administered 2022-07-28 – 2022-07-30 (×4): 3 mL via INTRAVENOUS

## 2022-07-28 MED ORDER — SODIUM CHLORIDE 0.9 % WEIGHT BASED INFUSION
1.0000 mL/kg/h | INTRAVENOUS | Status: DC
  Administered 2022-07-29: 1 mL/kg/h via INTRAVENOUS

## 2022-07-28 MED ORDER — ACETAMINOPHEN 325 MG PO TABS: 650.0000 mg | ORAL_TABLET | Freq: Four times a day (QID) | ORAL | Status: DC | PRN

## 2022-07-28 MED ORDER — METOPROLOL TARTRATE 12.5 MG HALF TABLET
12.5000 mg | ORAL_TABLET | Freq: Two times a day (BID) | ORAL | Status: DC
  Administered 2022-07-28 – 2022-07-30 (×3): 12.5 mg via ORAL
  Filled 2022-07-28 (×3): qty 1

## 2022-07-28 MED ORDER — ACETAMINOPHEN 650 MG RE SUPP: 650.0000 mg | Freq: Four times a day (QID) | RECTAL | Status: DC | PRN

## 2022-07-28 MED ORDER — CLOPIDOGREL BISULFATE 75 MG PO TABS
75.0000 mg | ORAL_TABLET | Freq: Every day | ORAL | Status: DC
  Administered 2022-07-28 – 2022-07-30 (×3): 75 mg via ORAL
  Filled 2022-07-28 (×3): qty 1

## 2022-07-28 MED ORDER — ASPIRIN 81 MG PO CHEW
81.0000 mg | CHEWABLE_TABLET | ORAL | Status: AC
  Administered 2022-07-29: 81 mg via ORAL
  Filled 2022-07-28: qty 1

## 2022-07-28 MED ORDER — SODIUM CHLORIDE 0.9% FLUSH: 3.0000 mL | INTRAVENOUS | Status: DC | PRN

## 2022-07-28 MED ORDER — HEPARIN (PORCINE) 25000 UT/250ML-% IV SOLN
1450.0000 [IU]/h | INTRAVENOUS | Status: DC
  Administered 2022-07-28: 1600 [IU]/h via INTRAVENOUS
  Administered 2022-07-28: 1400 [IU]/h via INTRAVENOUS
  Filled 2022-07-28 (×2): qty 250

## 2022-07-28 MED ORDER — SODIUM CHLORIDE 0.9 % IV SOLN: 250.0000 mL | INTRAVENOUS | Status: DC | PRN

## 2022-07-28 MED ORDER — SODIUM CHLORIDE 0.9 % WEIGHT BASED INFUSION
3.0000 mL/kg/h | INTRAVENOUS | Status: DC
  Administered 2022-07-29: 3 mL/kg/h via INTRAVENOUS

## 2022-07-28 MED ORDER — PAROXETINE HCL 20 MG PO TABS
40.0000 mg | ORAL_TABLET | Freq: Every day | ORAL | Status: DC
  Administered 2022-07-28 – 2022-07-30 (×3): 40 mg via ORAL
  Filled 2022-07-28 (×3): qty 2

## 2022-07-28 NOTE — Progress Notes (Signed)
ANTICOAGULATION CONSULT NOTE - Initial Consult  Pharmacy Consult for heparin Indication: atrial fibrillation  No Known Allergies  Patient Measurements: Height: 6' (182.9 cm) Weight: 104.3 kg (230 lb) IBW/kg (Calculated) : 77.6 Heparin Dosing Weight: 99.2 kg   Vital Signs: Temp: 97.8 F (36.6 C) (02/22 1546) Temp Source: Oral (02/22 1546) BP: 109/71 (02/22 1546) Pulse Rate: 57 (02/22 1546)  Labs: Recent Labs    07/27/22 1227 07/27/22 1551 07/28/22 0102 07/28/22 0450 07/28/22 0836 07/28/22 1441  HGB 16.2  --   --   --   --   --   HCT 47.4  --   --   --   --   --   PLT 216  --   --   --   --   --   APTT  --   --   --   --   --  60*  HEPARINUNFRC  --   --   --   --   --  >1.10*  CREATININE 1.01  --   --   --   --   --   TROPONINIHS 3   < > 4 5 3  $ --    < > = values in this interval not displayed.     Estimated Creatinine Clearance: 88.6 mL/min (by C-G formula based on SCr of 1.01 mg/dL).   Medical History: Past Medical History:  Diagnosis Date   Abnormal stress test 10/21/2019   Acute pain of left knee 04/14/2020   Altered mental status 10/25/2018   Atrial fibrillation with rapid ventricular response ([place]) 10/08/2018   Atrial fibrillation with rapid ventricular response ([place]) 10/08/2018   Cardiomyopathy ([place]) 10/25/2018   EF 25-30% on ST. 40% on ECHO 2020.  LVH   Chronic pain syndrome 04/27/2020   Coronary artery disease of native artery of native heart with stable angina pectoris ([place]) 12/02/2019   Formatting of this note might be different from the original. 10/2019. Cath at [facility] LAD 45%. EF 55-60%   Degeneration of lumbar intervertebral disc 07/30/2015   DOE (dyspnea on exertion) 10/16/2018   Facet arthropathy, cervical 01/30/2020   Formatting of this note might be different from the original. Added automatically from request for surgery [id number]  Formatting of this note might be different from the original. Added automatically from request for surgery [id number]   Fatigue  06/06/2019   Hearing loss 10/15/2018   High risk medication use 07/30/2015   Long-term use of aspirin therapy 10/15/2018   Malaise and fatigue 10/25/2018   Mild cognitive impairment 12/10/2018   Formatting of this note might be different from the original. Okay on eval   Mild episode of recurrent major depressive disorder ([place]) 07/30/2015   Last Assessment & Plan:  Relevant Hx: Course: Daily Update: Today's Plan:will refill his med for him today and he is stable with this  Electronically signed by: Baldemar Friday, FNP 07/30/15 [phone]   Mixed hyperlipidemia 07/30/2015   Pain of cervical facet joint 01/30/2020   Formatting of this note might be different from the original. Added automatically from request for surgery [id number]   Paroxysmal atrial fibrillation ([place]) 05/06/2019   Permanent atrial fibrillation ([place]) 10/08/2018   Persistent atrial fibrillation, status post cardioversion in July 2020 about flip back to atrial fibrillation a few weeks later 01/21/2019   Postlaminectomy syndrome of lumbar region 04/27/2020   Postural dizziness with presyncope 10/25/2018   Prediabetes 07/30/2015   Right shoulder injury 10/08/2018   S/P arthroscopy of right shoulder  03/12/2019   S/P rotator cuff repair 03/12/2019   SI (sacroiliac) pain 07/27/2020   Tear of right supraspinatus tendon 10/25/2018   Tremor 07/30/2015   Vitamin B 12 deficiency 07/30/2015   White matter disease, unspecified 11/19/2018   Noted on MRI of brain 2020. Discussed with pt and wife.    Medications:  Scheduled:   clopidogrel  75 mg Oral Daily   metoprolol tartrate  12.5 mg Oral BID   PARoxetine  40 mg Oral Daily   pregabalin  100 mg Oral Daily   rosuvastatin  20 mg Oral Daily   sodium chloride flush  3 mL Intravenous Q12H    Assessment: 83 yom presenting with angina - on apixaban PTA for hx Afib (LD 2/21@2006$ ). Plan to change to heparin infusion for procedures.   Given recent timing of DOAC, will monitor both heparin level and aPTT  until correlate.   Hgb 16.2, plt 216. Trop 3>5. No s/sx of bleeding.  Heparin level >1.1, PTT 60. We will increase rate and check at 6 hr levels.  Goal of Therapy:  Heparin level 0.3-0.7 units/ml aPTT 66-102 seconds Monitor platelets by anticoagulation protocol: Yes   Plan:  Increase heparin infusion to 1600 units/hr Check heparin level and aPTT in 6 hours Monitor daily HL/aPTT until correlate, CBC, and s/sx of bleeding  Onnie Boer, PharmD, BCIDP, AAHIVP, CPP Infectious Disease Pharmacist 07/28/2022 4:57 PM

## 2022-07-28 NOTE — Progress Notes (Signed)
ANTICOAGULATION CONSULT NOTE - Initial Consult  Pharmacy Consult for heparin Indication: atrial fibrillation  No Known Allergies  Patient Measurements: Height: 6' (182.9 cm) Weight: 104.3 kg (230 lb) IBW/kg (Calculated) : 77.6 Heparin Dosing Weight: 99.2 kg   Vital Signs: Temp: 97.9 F (36.6 C) (02/22 0728) Temp Source: Oral (02/22 0728) BP: 124/66 (02/22 0728) Pulse Rate: 51 (02/22 0728)  Labs: Recent Labs    07/27/22 1227 07/27/22 1551 07/28/22 0102 07/28/22 0450  HGB 16.2  --   --   --   HCT 47.4  --   --   --   PLT 216  --   --   --   CREATININE 1.01  --   --   --   TROPONINIHS 3 3 4 5    $ Estimated Creatinine Clearance: 88.6 mL/min (by C-G formula based on SCr of 1.01 mg/dL).   Medical History: Past Medical History:  Diagnosis Date   Abnormal stress test 10/21/2019   Acute pain of left knee 04/14/2020   Altered mental status 10/25/2018   Atrial fibrillation with rapid ventricular response ([place]) 10/08/2018   Atrial fibrillation with rapid ventricular response ([place]) 10/08/2018   Cardiomyopathy ([place]) 10/25/2018   EF 25-30% on ST. 40% on ECHO 2020.  LVH   Chronic pain syndrome 04/27/2020   Coronary artery disease of native artery of native heart with stable angina pectoris ([place]) 12/02/2019   Formatting of this note might be different from the original. 10/2019. Cath at [facility] LAD 45%. EF 55-60%   Degeneration of lumbar intervertebral disc 07/30/2015   DOE (dyspnea on exertion) 10/16/2018   Facet arthropathy, cervical 01/30/2020   Formatting of this note might be different from the original. Added automatically from request for surgery [id number]  Formatting of this note might be different from the original. Added automatically from request for surgery [id number]   Fatigue 06/06/2019   Hearing loss 10/15/2018   High risk medication use 07/30/2015   Long-term use of aspirin therapy 10/15/2018   Malaise and fatigue 10/25/2018   Mild cognitive impairment 12/10/2018   Formatting of this  note might be different from the original. 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 Added automatically from request for surgery [id number]   Paroxysmal atrial fibrillation ([place]) 05/06/2019   Permanent atrial fibrillation ([place]) 10/08/2018   Persistent atrial fibrillation, status post cardioversion in July 2020 about flip back to atrial fibrillation a few weeks later 01/21/2019   Postlaminectomy syndrome of lumbar region 04/27/2020   Postural dizziness with presyncope 10/25/2018   Prediabetes 07/30/2015   Right shoulder injury 10/08/2018   S/P arthroscopy of right shoulder 03/12/2019   S/P rotator cuff repair 03/12/2019   SI (sacroiliac) pain 07/27/2020   Tear of right supraspinatus tendon 10/25/2018   Tremor 07/30/2015   Vitamin B 12 deficiency 07/30/2015   White matter disease, unspecified 11/19/2018   Noted on MRI of brain 2020. Discussed with pt and wife.    Medications:  Scheduled:   clopidogrel  75 mg Oral Daily   metoprolol tartrate  12.5 mg Oral BID   PARoxetine  40 mg Oral  Daily   pregabalin  100 mg Oral Daily   rosuvastatin  20 mg Oral Daily   sodium chloride flush  3 mL Intravenous Q12H    Assessment: 1 yom presenting with angina - on apixaban PTA for hx Afib (LD 2/21@2006$ ). Plan to change to heparin infusion for procedures.   Given recent timing of DOAC, will monitor both heparin level and aPTT until correlate.   Hgb 16.2, plt 216. Trop 3>5. No s/sx of bleeding.  Goal of Therapy:  Heparin level 0.3-0.7 units/ml aPTT 66-102 seconds Monitor platelets by anticoagulation protocol: Yes   Plan:  Start  heparin infusion at 1400 units/hr now since 12 hours from last dose Order heparin level and aPTT in 6 hours Monitor daily HL/aPTT until correlate, CBC, and s/sx of bleeding  Antonietta Jewel, PharmD, [facility] Clinical Pharmacist  Phone: [phone] 07/28/2022 8:16 AM  Please check AMION for all [facility] phone numbers After 10:00 PM, call [place] [phone]

## 2022-07-28 NOTE — Plan of Care (Signed)
°  Problem: Coping: °Goal: Level of anxiety will decrease °Outcome: Progressing °  °

## 2022-07-28 NOTE — Progress Notes (Signed)
Patient seen and examined in the medical floor.  Was admitted early morning hours by nighttime hospitalist.  67 year old gentleman with history of coronary artery disease and previous stent presented with ongoing chest pain.  Currently denies any chest pain.  Remains on heparin infusion.  He was just seen by cardiology and discussed about doing cardiac cath tomorrow and is agreeable.  Unstable angina: Currently chest pain-free.  Patient with prior stent. On heparin infusion.  Remains on Plavix, metoprolol, Crestor. Continue close monitoring.  Is scheduled for cardiac cath with possible PCI tomorrow.   Same-day admit.  No charge visit.

## 2022-07-28 NOTE — Progress Notes (Signed)
[facility] hospital[facility] admission paged at 2209 regarding patient arrived to [phone]

## 2022-07-28 NOTE — H&P (Signed)
History and Physical    Richard Spence [id number] DOB: [birth year] DOA: 07/27/2022  PCP: [name]., MD  Patient coming from: [facility] ED  Chief Complaint: Chest pain  HPI: Richard Spence is a 67 y.o. male with medical history significant of CAD status post DES to proximal LAD in September 2022, paroxysmal A-fib on Eliquis, cardiomyopathy, chronic pain syndrome, hyperlipidemia, prediabetes presented to ED with chest pain and shortness of breath.  In the ED, patient slightly bradycardic with heart rate 50-60 but remainder of vital signs stable.  EKG showing sinus rhythm and no acute ischemic changes. Troponin negative x 2.  Chest x-ray showing no acute cardiopulmonary disease. Patient received Eliquis, Coreg, flecainide, Lyrica, and sublingual nitroglycerin x 2 in the ED.  ED physician discussed the case with cardiologist Dr. [name] who recommended admission at [facility] for cardiac rule out.  Patient states he has been having episodes of substernal chest tightness/heaviness for several weeks.  Onset not necessarily exertional as it can happen anytime.  Yesterday he was sitting in his recliner with his small 8 pound dog who likes to sit on his chest but his chest felt heavy as if his dog weighed 100 pounds.  Chest pain is associated with dyspnea but no diaphoresis, nausea, or vomiting.  States he had another episode this morning at rest which prompted him to go to the emergency room to be evaluated.  He was having 5 out of 10 intensity chest tightness/heaviness which improved after he was given 2 doses of sublingual nitroglycerin.  States at present he is not having any chest heaviness but feeling very slight 1 out of 10 intensity substernal tightness.  He reports history of prior cardiac stent.  Denies history of blood clots.  Denies any leg pain or swelling or recent travel.  No other complaints.  Review of Systems:  Review of Systems  All other systems reviewed and are  negative.   Past Medical History:  Diagnosis Date   Abnormal stress test 10/21/2019   Acute pain of left knee 04/14/2020   Altered mental status 10/25/2018   Atrial fibrillation with rapid ventricular response ([place]) 10/08/2018   Atrial fibrillation with rapid ventricular response ([place]) 10/08/2018   Cardiomyopathy ([place]) 10/25/2018   EF 25-30% on ST. 40% on ECHO 2020.  LVH   Chronic pain syndrome 04/27/2020   Coronary artery disease of native artery of native heart with stable angina pectoris ([place]) 12/02/2019   Formatting of this note might be different from the original. 10/2019. [facility] at [facility] LAD 45%. EF 55-60%   Degeneration of lumbar intervertebral disc 07/30/2015   DOE (dyspnea on exertion) 10/16/2018   Facet arthropathy, cervical 01/30/2020   Formatting of this note might be different from the original. Added automatically from request for surgery [id number]  Formatting of this note might be different from the original. Added automatically from request for surgery [id number]   Fatigue 06/06/2019   Hearing loss 10/15/2018   High risk medication use 07/30/2015   Long-term use of aspirin therapy 10/15/2018   Malaise and fatigue 10/25/2018   Mild cognitive impairment 12/10/2018   Formatting of this note might be different from the original. Okay on eval   Mild episode of recurrent major depressive disorder ([place]) 07/30/2015   Last Assessment & Plan:  Relevant Hx: Course: Daily Update: Today's Plan:will refill his med for him today and he is stable with this  Electronically signed by: Baldemar Friday, [place] 07/30/15 0910   Mixed hyperlipidemia 07/30/2015  Pain of cervical facet joint 01/30/2020   Formatting of this note might be different from the original. Added automatically from request for surgery [id number]   Paroxysmal atrial fibrillation ([place]) 05/06/2019   Permanent atrial fibrillation ([place]) 10/08/2018   Persistent atrial fibrillation, status post cardioversion in July 2020 about flip back to atrial  fibrillation a few weeks later 01/21/2019   Postlaminectomy syndrome of lumbar region 04/27/2020   Postural dizziness with presyncope 10/25/2018   Prediabetes 07/30/2015   Right shoulder injury 10/08/2018   S/P arthroscopy of right shoulder 03/12/2019   S/P rotator cuff repair 03/12/2019   SI (sacroiliac) pain 07/27/2020   Tear of right supraspinatus tendon 10/25/2018   Tremor 07/30/2015   Vitamin B 12 deficiency 07/30/2015   White matter disease, unspecified 11/19/2018   Noted on MRI of brain 2020. Discussed with pt and wife.    Past Surgical History:  Procedure Laterality Date   BACK SURGERY     CATARACT EXTRACTION, BILATERAL Bilateral 03/08/2021   03/22/2021   CHOLECYSTECTOMY     COLONOSCOPY  05/26/2016   Dr Lyda Jester. Diminutive colon polyp (2)   CORONARY STENT INTERVENTION N/A 02/16/2021   Procedure: CORONARY STENT INTERVENTION;  Surgeon: Jettie Booze, MD;  Location: [place] [facility];  Service: Cardiovascular;  Laterality: N/A;   ESOPHAGOGASTRODUODENOSCOPY  11/04/2014   Dr Lyda Jester. Irregular GE junction questionable signicance. Erosive gastritis. Gastric polyps. Hiatal hernia. No obvious esophageal stricture status post empiric esophageal dilatation to 34 French   HERNIA REPAIR  2009   INTRAVASCULAR ULTRASOUND/IVUS N/A 02/16/2021   Procedure: Intravascular Ultrasound/IVUS;  Surgeon: Jettie Booze, MD;  Location: [place] [facility];  Service: Cardiovascular;  Laterality: N/A;   LEFT HEART CATH AND CORONARY ANGIOGRAPHY N/A 10/25/2019   Procedure: LEFT HEART CATH AND CORONARY ANGIOGRAPHY;  Surgeon: [name], Peter M, MD;  Location: [place] [facility];  Service: Cardiovascular;  Laterality: N/A;   LEFT HEART CATH AND CORONARY ANGIOGRAPHY N/A 02/16/2021   Procedure: LEFT HEART CATH AND CORONARY ANGIOGRAPHY;  Surgeon: Jettie Booze, MD;  Location: [place] [facility];  Service: Cardiovascular;  Laterality: N/A;   ROTATOR CUFF REPAIR  03/07/2019   SHOULDER SURGERY Left  12/2010   Rotater cuff   SPINAL FUSION  1998   Dr Bulverde  2001   Spinal hardware removal by Dr. Sherryle Lis     reports that he has never smoked. He has never used smokeless tobacco. He reports that he does not currently use alcohol. He reports that he does not currently use drugs.  No Known Allergies  Family History  Problem Relation Age of Onset   Dementia Mother    Breast cancer Mother    Hypertension Mother    Hypertension Father    Dementia Maternal Grandmother    Prostate cancer Maternal Grandfather    Multiple sclerosis Son    Colon cancer Neg Hx    Rectal cancer Neg Hx    Stomach cancer Neg Hx    Liver cancer Neg Hx    Pancreatic cancer Neg Hx    Esophageal cancer Neg Hx     Prior to Admission medications   Medication Sig Start Date End Date Taking? Authorizing Provider  apixaban (ELIQUIS) 5 MG TABS tablet Take 1 tablet (5 mg total) by mouth 2 (two) times daily. 10/26/19  Yes [name], Peter M, MD  carvedilol (COREG) 3.125 MG tablet TAKE 1 TABLET BY MOUTH TWICE(2) DAILY Patient taking differently: Take 3.125 mg by mouth 2 (  two) times daily with a meal. 08/04/21  Yes Park Liter, MD  Cholecalciferol (VITAMIN D3) 125 MCG (5000 UT) CAPS Take 5,000 Units by mouth daily.    Yes [provider]  clopidogrel (PLAVIX) 75 MG tablet TAKE ONE (1) TABLET BY MOUTH EVERY DAY Patient taking differently: Take 75 mg by mouth daily. 04/22/22  Yes Park Liter, MD  cyclobenzaprine (FLEXERIL) 10 MG tablet Take 10 mg by mouth 2 (two) times daily as needed for muscle spasms. 04/11/22  Yes [provider]  diltiazem (CARDIZEM CD) 120 MG 24 hr capsule Take 120 mg by mouth daily.  11/01/18 07/27/22 Yes [provider]  flecainide (TAMBOCOR) 50 MG tablet Take 1 tablet (50 mg total) by mouth 2 (two) times daily. TAKE 1 TABLET BY MOUTH TWICE (2) DAILY Patient taking differently: Take 50 mg by mouth 2 (two) times daily. 02/28/22  Yes Park Liter, MD  nitroGLYCERIN (NITROSTAT) 0.4 MG SL tablet Place 1 tablet (0.4 mg total) under the tongue every 5 (five) minutes as needed. Patient taking differently: Place 0.4 mg under the tongue every 5 (five) minutes as needed for chest pain. 02/01/21 07/27/22 Yes Park Liter, MD  PARoxetine (PAXIL) 40 MG tablet Take 40 mg by mouth daily.  10/10/18  Yes [provider]  pregabalin (LYRICA) 75 MG capsule Take 150 capsules by mouth daily. 09/15/21  Yes [provider]  rosuvastatin (CRESTOR) 20 MG tablet TAKE ONE TABLET BY MOUTH EVERY DAY Patient taking differently: Take 20 mg by mouth daily. 06/02/21  Yes Park Liter, MD  vitamin B-12 (CYANOCOBALAMIN) 1000 MCG tablet Take 1,000 mcg by mouth daily.   Yes [provider]    Physical Exam: Vitals:   07/27/22 2015 07/27/22 2030 07/27/22 2031 07/27/22 2206  BP: 107/73 106/72  116/68  Pulse: (!) 59 60  (!) 52  Resp: (!) 25 14  16  $ Temp:   98.2 F (36.8 C) 98.6 F (37 C)  TempSrc:   Oral Oral  SpO2: 93% 98%  97%  Weight:      Height:        Physical Exam Vitals reviewed.  Constitutional:      General: He is not in acute distress. HENT:     Head: Normocephalic and atraumatic.  Eyes:     Extraocular Movements: Extraocular movements intact.  Cardiovascular:     Rate and Rhythm: Normal rate and regular rhythm.     Pulses: Normal pulses.  Pulmonary:     Effort: Pulmonary effort is normal. No respiratory distress.     Breath sounds: Normal breath sounds. No wheezing or rales.  Abdominal:     General: Bowel sounds are normal. There is no distension.     Palpations: Abdomen is soft.     Tenderness: There is no abdominal tenderness.  Musculoskeletal:     Cervical back: Normal range of motion.     Right lower leg: No edema.     Left lower leg: No edema.  Skin:    General: Skin is warm and dry.  Neurological:     General: No focal deficit present.     Mental Status: He is alert and oriented to person,  place, and time.     Labs on Admission: I have personally reviewed following labs and imaging studies  CBC: Recent Labs  Lab 07/27/22 1227  WBC 7.0  HGB 16.2  HCT 47.4  MCV 89.4  PLT 123XX123   Basic Metabolic Panel: Recent  Labs  Lab 07/27/22 1227  NA 136  K 3.8  CL 106  CO2 25  GLUCOSE 94  BUN 23  CREATININE 1.01  CALCIUM 8.8*   GFR: Estimated Creatinine Clearance: 88.6 mL/min (by C-G formula based on SCr of 1.01 mg/dL). Liver Function Tests: No results for input(s): "AST", "ALT", "ALKPHOS", "BILITOT", "PROT", "ALBUMIN" in the last 168 hours. No results for input(s): "LIPASE", "AMYLASE" in the last 168 hours. No results for input(s): "AMMONIA" in the last 168 hours. Coagulation Profile: No results for input(s): "INR", "PROTIME" in the last 168 hours. Cardiac Enzymes: No results for input(s): "CKTOTAL", "CKMB", "CKMBINDEX", "TROPONINI" in the last 168 hours. BNP (last 3 results) Recent Labs    08/03/21 1448  PROBNP 53   HbA1C: No results for input(s): "HGBA1C" in the last 72 hours. CBG: No results for input(s): "GLUCAP" in the last 168 hours. Lipid Profile: No results for input(s): "CHOL", "HDL", "LDLCALC", "TRIG", "CHOLHDL", "LDLDIRECT" in the last 72 hours. Thyroid Function Tests: No results for input(s): "TSH", "T4TOTAL", "FREET4", "T3FREE", "THYROIDAB" in the last 72 hours. Anemia Panel: No results for input(s): "VITAMINB12", "FOLATE", "FERRITIN", "TIBC", "IRON", "RETICCTPCT" in the last 72 hours. Urine analysis: No results found for: "COLORURINE", "APPEARANCEUR", "LABSPEC", "PHURINE", "GLUCOSEU", "HGBUR", "BILIRUBINUR", "KETONESUR", "PROTEINUR", "UROBILINOGEN", "NITRITE", "LEUKOCYTESUR"  Radiological Exams on Admission: DG Chest 2 View  Result Date: 07/27/2022 CLINICAL DATA:  Chest pain EXAM: CHEST - 2 VIEW COMPARISON:  Radiograph 02/15/2021 FINDINGS: Unchanged cardiomediastinal silhouette. There is no focal airspace consolidation. There is no pleural  effusion or evidence of pneumothorax. No acute osseous abnormality. Thoracic spondylosis. IMPRESSION: No evidence of acute cardiopulmonary disease. Electronically Signed   By: Maurine Simmering M.D.   On: 07/27/2022 13:29    Assessment and Plan  Chest pain History of CAD status post DES to proximal LAD in September 2022 EKG without acute ischemic changes.  Troponin negative x 2.  Chest x-ray showing no acute cardiopulmonary disease.  Chest pain has improved after receiving 2 doses of sublingual nitroglycerin in the ED but continues to endorse very mild 1/10 intensity substernal chest tightness.  Appears comfortable on exam.  Will continue cardiac monitoring and trend troponin.  Continue Plavix and Eliquis. Sublingual nitroglycerin as needed.  Echocardiogram ordered.  I have sent a message to cardiology requesting consultation in the morning.  PE less likely based on history, no tachycardia, and no hypoxia.  He is chronically anticoagulated with Eliquis and reports compliance.  Wells score 0.  Sinus bradycardia History of paroxysmal A-fib Current rhythm is sinus bradycardia, heart rate in the low 50s.  Blood pressure stable.  Patient was given Coreg in the ED.  Will hold Coreg and Cardizem at this time.  Continue flecainide and Eliquis.  Continue cardiac monitoring.  Hyperlipidemia Continue Crestor.  History of prediabetes Check A1c.  Chronic pain Continue Lyrica.  Mood disorder Continue Paxil.  DVT prophylaxis: Eliquis Code Status: Full Code (discussed with the patient) Level of care: Telemetry bed Admission status: It is my clinical opinion that referral for OBSERVATION is reasonable and necessary in this patient based on the above information provided. The aforementioned taken together are felt to place the patient at high risk for further clinical deterioration. However, it is anticipated that the patient may be medically stable for discharge from the hospital within 24 to 48 hours.    Shela Leff MD [facility]  If 7PM-7AM, please contact night-coverage [email or web address]  07/28/2022, 12:01 AM

## 2022-07-28 NOTE — Consult Note (Signed)
CARDIOLOGY CONSULT NOTE       Patient ID: DECARI KOPPLIN MRN: [id number] DOB/AGE: 1956-01-31 67 y.o.  Admit date: 07/27/2022 Referring Physician: [place] Primary Physician: [name]., MD Primary Cardiologist: [name] Reason for Consultation: Angina  Principal Problem:   Chest pain Active Problems:   HLD (hyperlipidemia)   Prediabetes   Paroxysmal atrial fibrillation (HCC)   CAD S/P percutaneous coronary angioplasty   Chronic pain   Sinus bradycardia   Mood disorder (HCC)   HPI:  67 y.o. patient of Dr [name]. Transferred from [facility] for angina Has had SSCP radiating to left arm for last few days. Troponin negative no acute ECG changes Pain improved with nitro and similar to his pain prior to proximal LAD stent done by Dr Irish Lack 02/16/21.  He has had normal LV function by TTE at same time He has history of PAF Unfortunately got both doses of his eliquis yesterday. Has been compliant with his meds. Has been on flecainide for his PAF ? With history of CAD if this is ok.  He is on statin for HLD and only on plavix for his CAD given need for anticoagulation   ROS All other systems reviewed and negative except as noted above  Past Medical History:  Diagnosis Date   Abnormal stress test 10/21/2019   Acute pain of left knee 04/14/2020   Altered mental status 10/25/2018   Atrial fibrillation with rapid ventricular response ([place]) 10/08/2018   Atrial fibrillation with rapid ventricular response ([place]) 10/08/2018   Cardiomyopathy ([place]) 10/25/2018   EF 25-30% on ST. 40% on ECHO 2020.  LVH   Chronic pain syndrome 04/27/2020   Coronary artery disease of native artery of native heart with stable angina pectoris ([place]) 12/02/2019   Formatting of this note might be different from the original. 10/2019. Cath at [facility] LAD 45%. EF 55-60%   Degeneration of lumbar intervertebral disc 07/30/2015   DOE (dyspnea on exertion) 10/16/2018   Facet arthropathy, cervical 01/30/2020   Formatting of this  note might be different from the original. Added automatically from request for surgery [id number]  Formatting of this note might be different from the original. Added automatically from request for surgery [id number]   Fatigue 06/06/2019   Hearing loss 10/15/2018   High risk medication use 07/30/2015   Long-term use of aspirin therapy 10/15/2018   Malaise and fatigue 10/25/2018   Mild cognitive impairment 12/10/2018   Formatting of this note might be different from the original. Okay on eval   Mild episode of recurrent major depressive disorder ([place]) 07/30/2015   Last Assessment & Plan:  Relevant Hx: Course: Daily Update: Today's Plan:will refill his med for him today and he is stable with this  Electronically signed by: Baldemar Friday, FNP 07/30/15 [phone]   Mixed hyperlipidemia 07/30/2015   Pain of cervical facet joint 01/30/2020   Formatting of this note might be different from the original. Added automatically from request for surgery [id number]   Paroxysmal atrial fibrillation ([place]) 05/06/2019   Permanent atrial fibrillation ([place]) 10/08/2018   Persistent atrial fibrillation, status post cardioversion in July 2020 about flip back to atrial fibrillation a few weeks later 01/21/2019   Postlaminectomy syndrome of lumbar region 04/27/2020   Postural dizziness with presyncope 10/25/2018   Prediabetes 07/30/2015   Right shoulder injury 10/08/2018   S/P arthroscopy of right shoulder 03/12/2019   S/P rotator cuff repair 03/12/2019   SI (sacroiliac) pain 07/27/2020   Tear of right supraspinatus tendon 10/25/2018  Tremor 07/30/2015   Vitamin B 12 deficiency 07/30/2015   White matter disease, unspecified 11/19/2018   Noted on MRI of brain 2020. Discussed with pt and wife.    Family History  Problem Relation Age of Onset   Dementia Mother    Breast cancer Mother    Hypertension Mother    Hypertension Father    Dementia Maternal Grandmother    Prostate cancer Maternal Grandfather    Multiple sclerosis Son     Colon cancer Neg Hx    Rectal cancer Neg Hx    Stomach cancer Neg Hx    Liver cancer Neg Hx    Pancreatic cancer Neg Hx    Esophageal cancer Neg Hx     Social History   Socioeconomic History   Marital status: Married    Spouse name: [name]   Number of children: 1   Years of education: Not on file   Highest education level: Not on file  Occupational History   Occupation: Retired  Tobacco Use   Smoking status: Never   Smokeless tobacco: Never  Vaping Use   Vaping Use: Never used  Substance and Sexual Activity   Alcohol use: Not Currently    Comment: since March 2023   Drug use: Not Currently   Sexual activity: Not on file  Other Topics Concern   Not on file  Social History Narrative   Not on file   Social Determinants of Health   Financial Resource Strain: Not on file  Food Insecurity: No Food Insecurity (07/27/2022)   Hunger Vital Sign    Worried About Running Out of Food in the Last Year: Never true    Ran Out of Food in the Last Year: Never true  Transportation Needs: No Transportation Needs (07/27/2022)   PRAPARE - Hydrologist (Medical): No    Lack of Transportation (Non-Medical): No  Physical Activity: Not on file  Stress: Not on file  Social Connections: Not on file  Intimate Partner Violence: Not At Risk (07/27/2022)   Humiliation, Afraid, Rape, and Kick questionnaire    Fear of Current or Ex-Partner: No    Emotionally Abused: No    Physically Abused: No    Sexually Abused: No    Past Surgical History:  Procedure Laterality Date   BACK SURGERY     CATARACT EXTRACTION, BILATERAL Bilateral 03/08/2021   03/22/2021   CHOLECYSTECTOMY     COLONOSCOPY  05/26/2016   Dr Lyda Jester. Diminutive colon polyp (2)   CORONARY STENT INTERVENTION N/A 02/16/2021   Procedure: CORONARY STENT INTERVENTION;  Surgeon: Jettie Booze, MD;  Location: [place] [facility];  Service: Cardiovascular;  Laterality: N/A;   ESOPHAGOGASTRODUODENOSCOPY   11/04/2014   Dr Lyda Jester. Irregular GE junction questionable signicance. Erosive gastritis. Gastric polyps. Hiatal hernia. No obvious esophageal stricture status post empiric esophageal dilatation to 66 [place]   HERNIA REPAIR  2009   INTRAVASCULAR ULTRASOUND/IVUS N/A 02/16/2021   Procedure: Intravascular Ultrasound/IVUS;  Surgeon: Jettie Booze, MD;  Location: [place] [facility];  Service: Cardiovascular;  Laterality: N/A;   LEFT HEART CATH AND CORONARY ANGIOGRAPHY N/A 10/25/2019   Procedure: LEFT HEART CATH AND CORONARY ANGIOGRAPHY;  Surgeon: [name], Zi Sek M, MD;  Location: [place] [facility];  Service: Cardiovascular;  Laterality: N/A;   LEFT HEART CATH AND CORONARY ANGIOGRAPHY N/A 02/16/2021   Procedure: LEFT HEART CATH AND CORONARY ANGIOGRAPHY;  Surgeon: Jettie Booze, MD;  Location: [place] [facility];  Service: Cardiovascular;  Laterality: N/A;   ROTATOR CUFF REPAIR  03/07/2019   SHOULDER SURGERY Left 12/2010   Rotater cuff   SPINAL FUSION  1998   Dr Rodeo SURGERY  2001   Spinal hardware removal by Dr. Sherryle Lis      Current Facility-Administered Medications:    acetaminophen (TYLENOL) tablet 650 mg, 650 mg, Oral, Q6H PRN **OR** acetaminophen (TYLENOL) suppository 650 mg, 650 mg, Rectal, Q6H PRN, Shela Leff, MD   clopidogrel (PLAVIX) tablet 75 mg, 75 mg, Oral, Daily, Shela Leff, MD   flecainide (TAMBOCOR) tablet 50 mg, 50 mg, Oral, BID, Shela Leff, MD, 50 mg at 07/27/22 1954   nitroGLYCERIN (NITROSTAT) SL tablet 0.4 mg, 0.4 mg, Sublingual, Q5 min PRN, Shela Leff, MD, 0.4 mg at 07/27/22 1955   PARoxetine (PAXIL) tablet 40 mg, 40 mg, Oral, Daily, Shela Leff, MD   pregabalin (LYRICA) capsule 100 mg, 100 mg, Oral, Daily, Shela Leff, MD, 100 mg at 07/27/22 1954   rosuvastatin (CRESTOR) tablet 20 mg, 20 mg, Oral, Daily, Shela Leff, MD   sodium chloride flush (NS) 0.9 % injection 3 mL, 3 mL, Intravenous, Q12H,  Josue Hector, MD  clopidogrel  75 mg Oral Daily   flecainide  50 mg Oral BID   PARoxetine  40 mg Oral Daily   pregabalin  100 mg Oral Daily   rosuvastatin  20 mg Oral Daily   sodium chloride flush  3 mL Intravenous Q12H     Physical Exam: Blood pressure 124/66, pulse (!) 51, temperature 97.9 F (36.6 C), temperature source Oral, resp. rate 17, height 6' (1.829 m), weight 104.3 kg, SpO2 93 %.    Affect appropriate Healthy:  appears stated age 89: normal Neck supple with no adenopathy JVP normal no bruits no thyromegaly Lungs clear with no wheezing and good diaphragmatic motion Heart:  S1/S2 no murmur, no rub, gallop or click PMI normal Abdomen: benighn, BS positve, no tenderness, no AAA no bruit.  No HSM or HJR Distal pulses intact with no bruits No edema Neuro non-focal Skin warm and dry No muscular weakness   Labs:   Lab Results  Component Value Date   WBC 7.0 07/27/2022   HGB 16.2 07/27/2022   HCT 47.4 07/27/2022   MCV 89.4 07/27/2022   PLT 216 07/27/2022    Recent Labs  Lab 07/27/22 1227  NA 136  K 3.8  CL 106  CO2 25  BUN 23  CREATININE 1.01  CALCIUM 8.8*  GLUCOSE 94   No results found for: "CKTOTAL", "CKMB", "CKMBINDEX", "TROPONINI"  Lab Results  Component Value Date   CHOL 132 10/19/2021   CHOL 134 02/16/2021   CHOL 121 03/06/2020   Lab Results  Component Value Date   HDL 44 10/19/2021   HDL 58 02/16/2021   HDL 51 03/06/2020   Lab Results  Component Value Date   LDLCALC 60 10/19/2021   LDLCALC 59 02/16/2021   LDLCALC 53 03/06/2020   Lab Results  Component Value Date   TRIG 168 (H) 10/19/2021   TRIG 87 02/16/2021   TRIG 91 03/06/2020   Lab Results  Component Value Date   CHOLHDL 3.0 10/19/2021   CHOLHDL 2.3 02/16/2021   CHOLHDL 2.4 03/06/2020   No results found for: "LDLDIRECT"    Radiology: DG Chest 2 View  Result Date: 07/27/2022 CLINICAL DATA:  Chest pain EXAM: CHEST - 2 VIEW COMPARISON:  Radiograph 02/15/2021  FINDINGS: Unchanged cardiomediastinal silhouette. There is no focal airspace consolidation. There is no pleural  effusion or evidence of pneumothorax. No acute osseous abnormality. Thoracic spondylosis. IMPRESSION: No evidence of acute cardiopulmonary disease. Electronically Signed   By: Maurine Simmering M.D.   On: 07/27/2022 13:29   DG Foot Complete Left  Result Date: 07/07/2022 Please see detailed radiograph report in office note.   EKG: SR rate 68 no acute changes    ASSESSMENT AND PLAN:   Angina:  prior stent proximal LAD 02/16/21 Did not have dx in RCA/LCX at that time R/O pain relieved with nitro and similar to pre stent pain Shared decision making favor diagnostic cath in am Hold eliquis start heparin  PAF:  he has known CAD and coming in with angina possible restenosis He should not be on flecainide a 1c agent will d/c Cover with heparin and resume eliquis post cath   Patient scheduled with Dr Irish Lack first case tomorrow He did his prior stent   Shared Decision Making/Informed Consent The risks [stroke (1 in 1000), death (1 in 40), kidney failure [usually temporary] (1 in 500), bleeding (1 in 200), allergic reaction [possibly serious] (1 in 200)], benefits (diagnostic support and management of coronary artery disease) and alternatives of a cardiac catheterization were discussed in detail with Ms. Jeanell Sparrow and she is willing to proceed.   SignedJenkins Rouge 07/28/2022, 7:43 AM

## 2022-07-28 NOTE — H&P (Incomplete)
History and Physical    Richard Spence [id number] DOB: [birth year] DOA: 07/27/2022  PCP: [name]., MD  Patient coming from: {Blank single:19197::"Home","SNF","ALF/ILF","Group Home","BHH","CIR","Hospice","Homeless","[facility] ED","[facility] ED","Outside Hospital","***"}  Chief Complaint: ***  HPI: Richard Spence is a 67 y.o. male with medical history significant of CAD status post DES to proximal LAD in September 2022, paroxysmal A-fib, cardiomyopathy,  ED Course: ***  Review of Systems:  ROS  Past Medical History:  Diagnosis Date  . Abnormal stress test 10/21/2019  . Acute pain of left knee 04/14/2020  . Altered mental status 10/25/2018  . Atrial fibrillation with rapid ventricular response ([place]) 10/08/2018  . Atrial fibrillation with rapid ventricular response ([place]) 10/08/2018  . Cardiomyopathy ([place]) 10/25/2018   EF 25-30% on ST. 40% on ECHO 2020.  LVH  . Chronic pain syndrome 04/27/2020  . Coronary artery disease of native artery of native heart with stable angina pectoris ([place]) 12/02/2019   Formatting of this note might be different from the original. 10/2019. Cath at [facility] LAD 45%. EF 55-60%  . Degeneration of lumbar intervertebral disc 07/30/2015  . DOE (dyspnea on exertion) 10/16/2018  . Facet arthropathy, cervical 01/30/2020   Formatting of this note might be different from the original. Added automatically from request for surgery [id number]  Formatting of this note might be different from the original. Added automatically from request for surgery [id number]  . Fatigue 06/06/2019  . Hearing loss 10/15/2018  . High risk medication use 07/30/2015  . Long-term use of aspirin therapy 10/15/2018  . Malaise and fatigue 10/25/2018  . Mild cognitive impairment 12/10/2018   Formatting of this note might be different from the original. Okay on eval  . Mild episode of recurrent major depressive disorder ([place]) 07/30/2015   Last Assessment & Plan:  Relevant Hx: Course: Daily Update: Today's Plan:will  refill his med for him today and he is stable with this  Electronically signed by: Baldemar Friday, [place] 07/30/15 [phone]  . Mixed hyperlipidemia 07/30/2015  . Pain of cervical facet joint 01/30/2020   Formatting of this note might be different from the original. Added automatically from request for surgery [id number]  . Paroxysmal atrial fibrillation ([place]) 05/06/2019  . Permanent atrial fibrillation ([place]) 10/08/2018  . Persistent atrial fibrillation, status post cardioversion in July 2020 about flip back to atrial fibrillation a few weeks later 01/21/2019  . Postlaminectomy syndrome of lumbar region 04/27/2020  . Postural dizziness with presyncope 10/25/2018  . Prediabetes 07/30/2015  . Right shoulder injury 10/08/2018  . S/P arthroscopy of right shoulder 03/12/2019  . S/P rotator cuff repair 03/12/2019  . SI (sacroiliac) pain 07/27/2020  . Tear of right supraspinatus tendon 10/25/2018  . Tremor 07/30/2015  . Vitamin B 12 deficiency 07/30/2015  . White matter disease, unspecified 11/19/2018   Noted on MRI of brain 2020. Discussed with pt and wife.    Past Surgical History:  Procedure Laterality Date  . BACK SURGERY    . CATARACT EXTRACTION, BILATERAL Bilateral 03/08/2021   03/22/2021  . CHOLECYSTECTOMY    . COLONOSCOPY  05/26/2016   Dr Lyda Jester. Diminutive colon polyp (2)  . CORONARY STENT INTERVENTION N/A 02/16/2021   Procedure: CORONARY STENT INTERVENTION;  Surgeon: Jettie Booze, MD;  Location: [place] [facility];  Service: Cardiovascular;  Laterality: N/A;  . ESOPHAGOGASTRODUODENOSCOPY  11/04/2014   Dr Lyda Jester. Irregular GE junction questionable signicance. Erosive gastritis. Gastric polyps. Hiatal hernia. No obvious esophageal stricture status post empiric esophageal dilatation to 24 [place]  . HERNIA REPAIR  2009  . INTRAVASCULAR ULTRASOUND/IVUS N/A 02/16/2021   Procedure: Intravascular Ultrasound/IVUS;  Surgeon: Jettie Booze, MD;  Location: [place] [facility];   Service: Cardiovascular;  Laterality: N/A;  . LEFT HEART CATH AND CORONARY ANGIOGRAPHY N/A 10/25/2019   Procedure: LEFT HEART CATH AND CORONARY ANGIOGRAPHY;  Surgeon: [name], Peter M, MD;  Location: [place] [facility];  Service: Cardiovascular;  Laterality: N/A;  . LEFT HEART CATH AND CORONARY ANGIOGRAPHY N/A 02/16/2021   Procedure: LEFT HEART CATH AND CORONARY ANGIOGRAPHY;  Surgeon: Jettie Booze, MD;  Location: [place] [facility];  Service: Cardiovascular;  Laterality: N/A;  . ROTATOR CUFF REPAIR  03/07/2019  . SHOULDER SURGERY Left 12/2010   Rotater cuff  . SPINAL FUSION  1998   Dr Lang Snow  . SPINE SURGERY  2001   Spinal hardware removal by Dr. Sherryle Lis     reports that he has never smoked. He has never used smokeless tobacco. He reports that he does not currently use alcohol. He reports that he does not currently use drugs.  No Known Allergies  Family History  Problem Relation Age of Onset  . Dementia Mother   . Breast cancer Mother   . Hypertension Mother   . Hypertension Father   . Dementia Maternal Grandmother   . Prostate cancer Maternal Grandfather   . Multiple sclerosis Son   . Colon cancer Neg Hx   . Rectal cancer Neg Hx   . Stomach cancer Neg Hx   . Liver cancer Neg Hx   . Pancreatic cancer Neg Hx   . Esophageal cancer Neg Hx     Prior to Admission medications   Medication Sig Start Date End Date Taking? Authorizing Provider  apixaban (ELIQUIS) 5 MG TABS tablet Take 1 tablet (5 mg total) by mouth 2 (two) times daily. 10/26/19  Yes [name], Peter M, MD  carvedilol (COREG) 3.125 MG tablet TAKE 1 TABLET BY MOUTH TWICE(2) DAILY Patient taking differently: Take 3.125 mg by mouth 2 (two) times daily with a meal. 08/04/21  Yes Park Liter, MD  Cholecalciferol (VITAMIN D3) 125 MCG (5000 UT) CAPS Take 5,000 Units by mouth daily.    Yes [provider]  clopidogrel (PLAVIX) 75 MG tablet TAKE ONE (1) TABLET BY MOUTH EVERY DAY Patient taking differently:  Take 75 mg by mouth daily. 04/22/22  Yes Park Liter, MD  cyclobenzaprine (FLEXERIL) 10 MG tablet Take 10 mg by mouth 2 (two) times daily as needed for muscle spasms. 04/11/22  Yes [provider]  diltiazem (CARDIZEM CD) 120 MG 24 hr capsule Take 120 mg by mouth daily.  11/01/18 07/27/22 Yes [provider]  flecainide (TAMBOCOR) 50 MG tablet Take 1 tablet (50 mg total) by mouth 2 (two) times daily. TAKE 1 TABLET BY MOUTH TWICE (2) DAILY Patient taking differently: Take 50 mg by mouth 2 (two) times daily. 02/28/22  Yes Park Liter, MD  nitroGLYCERIN (NITROSTAT) 0.4 MG SL tablet Place 1 tablet (0.4 mg total) under the tongue every 5 (five) minutes as needed. Patient taking differently: Place 0.4 mg under the tongue every 5 (five) minutes as needed for chest pain. 02/01/21 07/27/22 Yes Park Liter, MD  PARoxetine (PAXIL) 40 MG tablet Take 40 mg by mouth daily.  10/10/18  Yes [provider]  pregabalin (LYRICA) 75 MG capsule Take 150 capsules by mouth daily. 09/15/21  Yes [provider]  rosuvastatin (CRESTOR) 20 MG tablet TAKE ONE TABLET BY MOUTH EVERY DAY Patient taking differently:  Take 20 mg by mouth daily. 06/02/21  Yes Park Liter, MD  vitamin B-12 (CYANOCOBALAMIN) 1000 MCG tablet Take 1,000 mcg by mouth daily.   Yes [provider]    Physical Exam: Vitals:   07/27/22 2015 07/27/22 2030 07/27/22 2031 07/27/22 2206  BP: 107/73 106/72  116/68  Pulse: (!) 59 60  (!) 52  Resp: (!) 25 14  16  $ Temp:   98.2 F (36.8 C) 98.6 F (37 C)  TempSrc:   Oral Oral  SpO2: 93% 98%  97%  Weight:      Height:        Physical Exam  Labs on Admission: I have personally reviewed following labs and imaging studies  CBC: Recent Labs  Lab 07/27/22 1227  WBC 7.0  HGB 16.2  HCT 47.4  MCV 89.4  PLT 123XX123   Basic Metabolic Panel: Recent Labs  Lab 07/27/22 1227  NA 136  K 3.8  CL 106  CO2 25  GLUCOSE 94  BUN 23  CREATININE  1.01  CALCIUM 8.8*   GFR: Estimated Creatinine Clearance: 88.6 mL/min (by C-G formula based on SCr of 1.01 mg/dL). Liver Function Tests: No results for input(s): "AST", "ALT", "ALKPHOS", "BILITOT", "PROT", "ALBUMIN" in the last 168 hours. No results for input(s): "LIPASE", "AMYLASE" in the last 168 hours. No results for input(s): "AMMONIA" in the last 168 hours. Coagulation Profile: No results for input(s): "INR", "PROTIME" in the last 168 hours. Cardiac Enzymes: No results for input(s): "CKTOTAL", "CKMB", "CKMBINDEX", "TROPONINI" in the last 168 hours. BNP (last 3 results) Recent Labs    08/03/21 1448  PROBNP 53   HbA1C: No results for input(s): "HGBA1C" in the last 72 hours. CBG: No results for input(s): "GLUCAP" in the last 168 hours. Lipid Profile: No results for input(s): "CHOL", "HDL", "LDLCALC", "TRIG", "CHOLHDL", "LDLDIRECT" in the last 72 hours. Thyroid Function Tests: No results for input(s): "TSH", "T4TOTAL", "FREET4", "T3FREE", "THYROIDAB" in the last 72 hours. Anemia Panel: No results for input(s): "VITAMINB12", "FOLATE", "FERRITIN", "TIBC", "IRON", "RETICCTPCT" in the last 72 hours. Urine analysis: No results found for: "COLORURINE", "APPEARANCEUR", "LABSPEC", "PHURINE", "GLUCOSEU", "HGBUR", "BILIRUBINUR", "KETONESUR", "PROTEINUR", "UROBILINOGEN", "NITRITE", "LEUKOCYTESUR"  Radiological Exams on Admission: DG Chest 2 View  Result Date: 07/27/2022 CLINICAL DATA:  Chest pain EXAM: CHEST - 2 VIEW COMPARISON:  Radiograph 02/15/2021 FINDINGS: Unchanged cardiomediastinal silhouette. There is no focal airspace consolidation. There is no pleural effusion or evidence of pneumothorax. No acute osseous abnormality. Thoracic spondylosis. IMPRESSION: No evidence of acute cardiopulmonary disease. Electronically Signed   By: Maurine Simmering M.D.   On: 07/27/2022 13:29    EKG: Independently reviewed. ***  Assessment and Plan  DVT prophylaxis: {Blank single:19197::"Lovenox","SQ  Heparin","IV heparin gtt","Xarelto","Eliquis","Coumadin","SCDs","***"} Code Status: {Blank single:19197::"Full Code","DNR","DNR/DNI","Comfort Care","***"} Family Communication: ***  Consults called: ***  Level of care: {Blank single:19197::"Med-Surg","Telemetry bed","Progressive Care Unit","Step Down Unit"} Admission status: *** Time Spent: 75+ minutes***  Shela Leff MD [facility]  If 7PM-7AM, please contact night-coverage [email or web address]  07/28/2022, 12:01 AM

## 2022-07-28 NOTE — Progress Notes (Signed)
Echocardiogram 2D Echocardiogram has been performed.  Ronny Flurry 07/28/2022, 5:01 PM

## 2022-07-29 ENCOUNTER — Encounter (HOSPITAL_COMMUNITY): Payer: Self-pay | Admitting: Interventional Cardiology

## 2022-07-29 ENCOUNTER — Encounter (HOSPITAL_COMMUNITY)
Admission: EM | Disposition: A | Discharge status: Home / Self Care | Payer: Self-pay | Source: Home / Self Care | Attending: Internal Medicine

## 2022-07-29 ENCOUNTER — Encounter: Payer: Self-pay | Admitting: Podiatry

## 2022-07-29 ENCOUNTER — Ambulatory Visit (HOSPITAL_COMMUNITY): Admit: 2022-07-29 | Payer: Medicare HMO | Admitting: Interventional Cardiology

## 2022-07-29 DIAGNOSIS — Z9861 Coronary angioplasty status: Secondary | ICD-10-CM | POA: Diagnosis not present

## 2022-07-29 DIAGNOSIS — I25110 Atherosclerotic heart disease of native coronary artery with unstable angina pectoris: Secondary | ICD-10-CM

## 2022-07-29 DIAGNOSIS — I200 Unstable angina: Secondary | ICD-10-CM | POA: Diagnosis not present

## 2022-07-29 HISTORY — PX: CORONARY ULTRASOUND/IVUS: CATH118244

## 2022-07-29 HISTORY — PX: CORONARY STENT INTERVENTION: CATH118234

## 2022-07-29 HISTORY — PX: LEFT HEART CATH AND CORONARY ANGIOGRAPHY: CATH118249

## 2022-07-29 HISTORY — PX: CORONARY PRESSURE/FFR STUDY: CATH118243

## 2022-07-29 LAB — POCT ACTIVATED CLOTTING TIME
Activated Clotting Time: 304 seconds
Activated Clotting Time: 320 seconds

## 2022-07-29 LAB — APTT
aPTT: 128 seconds — ABNORMAL HIGH (ref 24–36)
aPTT: >200 seconds (ref 24–36)

## 2022-07-29 SURGERY — LEFT HEART CATH AND CORONARY ANGIOGRAPHY
Anesthesia: LOCAL

## 2022-07-29 MED ORDER — NITROGLYCERIN 1 MG/10 ML FOR IR/CATH LAB
INTRA_ARTERIAL | Status: DC | PRN
  Administered 2022-07-29: 500 ug via INTRA_ARTERIAL

## 2022-07-29 MED ORDER — SODIUM CHLORIDE 0.9 % IV SOLN: 250.0000 mL | INTRAVENOUS | Status: DC | PRN

## 2022-07-29 MED ORDER — LIDOCAINE HCL (PF) 1 % IJ SOLN
INTRAMUSCULAR | Status: AC
  Filled 2022-07-29: qty 30

## 2022-07-29 MED ORDER — SODIUM CHLORIDE 0.9% FLUSH
3.0000 mL | Freq: Two times a day (BID) | INTRAVENOUS | Status: DC
  Administered 2022-07-29 – 2022-07-30 (×3): 3 mL via INTRAVENOUS

## 2022-07-29 MED ORDER — MIDAZOLAM HCL 2 MG/2ML IJ SOLN
INTRAMUSCULAR | Status: DC | PRN
  Administered 2022-07-29: 2 mg via INTRAVENOUS
  Administered 2022-07-29 (×2): 1 mg via INTRAVENOUS

## 2022-07-29 MED ORDER — FENTANYL CITRATE (PF) 100 MCG/2ML IJ SOLN
INTRAMUSCULAR | Status: AC
  Filled 2022-07-29: qty 2

## 2022-07-29 MED ORDER — LABETALOL HCL 5 MG/ML IV SOLN: 10.0000 mg | INTRAVENOUS | Status: AC | PRN

## 2022-07-29 MED ORDER — VERAPAMIL HCL 2.5 MG/ML IV SOLN
INTRAVENOUS | Status: DC | PRN
  Administered 2022-07-29: 10 mL via INTRA_ARTERIAL

## 2022-07-29 MED ORDER — NITROGLYCERIN 1 MG/10 ML FOR IR/CATH LAB
INTRA_ARTERIAL | Status: AC
  Filled 2022-07-29: qty 10

## 2022-07-29 MED ORDER — FENTANYL CITRATE (PF) 100 MCG/2ML IJ SOLN
INTRAMUSCULAR | Status: DC | PRN
  Administered 2022-07-29 (×3): 25 ug via INTRAVENOUS

## 2022-07-29 MED ORDER — HEPARIN SODIUM (PORCINE) 1000 UNIT/ML IJ SOLN
INTRAMUSCULAR | Status: DC | PRN
  Administered 2022-07-29 (×2): 5000 [IU] via INTRAVENOUS
  Administered 2022-07-29: 2000 [IU] via INTRAVENOUS

## 2022-07-29 MED ORDER — HEPARIN SODIUM (PORCINE) 1000 UNIT/ML IJ SOLN
INTRAMUSCULAR | Status: AC
  Filled 2022-07-29: qty 10

## 2022-07-29 MED ORDER — IOHEXOL 350 MG/ML SOLN
INTRAVENOUS | Status: DC | PRN
  Administered 2022-07-29: 140 mL

## 2022-07-29 MED ORDER — MIDAZOLAM HCL 2 MG/2ML IJ SOLN
INTRAMUSCULAR | Status: AC
  Filled 2022-07-29: qty 2

## 2022-07-29 MED ORDER — SODIUM CHLORIDE 0.9 % IV SOLN: INTRAVENOUS | Status: AC

## 2022-07-29 MED ORDER — HYDRALAZINE HCL 20 MG/ML IJ SOLN: 10.0000 mg | INTRAMUSCULAR | Status: AC | PRN

## 2022-07-29 MED ORDER — ASPIRIN 81 MG PO CHEW
81.0000 mg | CHEWABLE_TABLET | Freq: Every day | ORAL | Status: DC
  Administered 2022-07-30: 81 mg via ORAL
  Filled 2022-07-29: qty 1

## 2022-07-29 MED ORDER — APIXABAN 5 MG PO TABS
5.0000 mg | ORAL_TABLET | Freq: Two times a day (BID) | ORAL | Status: DC
  Administered 2022-07-30: 5 mg via ORAL
  Filled 2022-07-29: qty 1

## 2022-07-29 MED ORDER — CLOPIDOGREL BISULFATE 75 MG PO TABS: 75.0000 mg | ORAL_TABLET | Freq: Every day | ORAL | Status: DC

## 2022-07-29 MED ORDER — LIDOCAINE HCL (PF) 1 % IJ SOLN
INTRAMUSCULAR | Status: DC | PRN
  Administered 2022-07-29: 2 mL

## 2022-07-29 MED ORDER — SODIUM CHLORIDE 0.9% FLUSH: 3.0000 mL | INTRAVENOUS | Status: DC | PRN

## 2022-07-29 MED ORDER — HEPARIN (PORCINE) IN NACL 1000-0.9 UT/500ML-% IV SOLN
INTRAVENOUS | Status: DC | PRN
  Administered 2022-07-29 (×2): 500 mL

## 2022-07-29 MED ORDER — ONDANSETRON HCL 4 MG/2ML IJ SOLN: 4.0000 mg | Freq: Four times a day (QID) | INTRAMUSCULAR | Status: DC | PRN

## 2022-07-29 MED ORDER — VERAPAMIL HCL 2.5 MG/ML IV SOLN
INTRAVENOUS | Status: AC
  Filled 2022-07-29: qty 2

## 2022-07-29 MED ORDER — ACETAMINOPHEN 325 MG PO TABS: 650.0000 mg | ORAL_TABLET | ORAL | Status: DC | PRN

## 2022-07-29 MED ORDER — CLOPIDOGREL BISULFATE 300 MG PO TABS
ORAL_TABLET | ORAL | Status: AC
  Filled 2022-07-29: qty 1

## 2022-07-29 MED ORDER — CLOPIDOGREL BISULFATE 300 MG PO TABS
ORAL_TABLET | ORAL | Status: DC | PRN
  Administered 2022-07-29: 300 mg via ORAL

## 2022-07-29 SURGICAL SUPPLY — 25 items
BALLN ~~LOC~~ EMERGE MR 3.75X6 (BALLOONS) ×1
BALLN ~~LOC~~ EMERGE MR 4.0X8 (BALLOONS) ×1
BALLOON ~~LOC~~ EMERGE MR 3.75X6 (BALLOONS) IMPLANT
BALLOON ~~LOC~~ EMERGE MR 4.0X8 (BALLOONS) IMPLANT
BAND ZEPHYR COMPRESS 30 LONG (HEMOSTASIS) IMPLANT
CATH INFINITI 5 FR 3DRC (CATHETERS) IMPLANT
CATH INFINITI 5FR MULTPACK ANG (CATHETERS) IMPLANT
CATH LAUNCHER 6FR EBU 3 (CATHETERS) IMPLANT
CATH LAUNCHER 6FR EBU3.5 (CATHETERS) IMPLANT
CATH OPTICROSS HD (CATHETERS) IMPLANT
GLIDESHEATH SLEND SS 6F .021 (SHEATH) IMPLANT
GUIDEWIRE INQWIRE 1.5J.035X260 (WIRE) IMPLANT
GUIDEWIRE PRESSURE X 175 (WIRE) IMPLANT
INQWIRE 1.5J .035X260CM (WIRE) ×1
KIT ENCORE 26 ADVANTAGE (KITS) IMPLANT
KIT HEART LEFT (KITS) ×1 IMPLANT
KIT HEMO VALVE WATCHDOG (MISCELLANEOUS) IMPLANT
PACK CARDIAC CATHETERIZATION (CUSTOM PROCEDURE TRAY) ×1 IMPLANT
SHEATH PROBE COVER 6X72 (BAG) IMPLANT
SLED PULL BACK IVUS (MISCELLANEOUS) IMPLANT
STENT ONYX FRONTIER 3.5X08 (Permanent Stent) IMPLANT
TRANSDUCER W/STOPCOCK (MISCELLANEOUS) ×1 IMPLANT
TUBING CIL FLEX 10 FLL-RA (TUBING) ×1 IMPLANT
WIRE ASAHI PROWATER 180CM (WIRE) IMPLANT
WIRE RUNTHROUGH .014X180CM (WIRE) IMPLANT

## 2022-07-29 NOTE — Progress Notes (Signed)
Subjective:  Denies SSCP, palpitations or Dyspnea For cath first case this am with Dr Irish Lack  Objective:  Vitals:   07/28/22 2013 07/28/22 2202 07/28/22 2350 07/29/22 0436  BP: 129/68  130/68 127/73  Pulse: 60  (!) 53 (!) 51  Resp: '18  18 18  '$ Temp: 97.7 F (36.5 C)  98.3 F (36.8 C) 97.8 F (36.6 C)  TempSrc: Oral  Oral Oral  SpO2: 94%  93% 92%  Weight:  103.1 kg    Height:        Intake/Output from previous day:  Intake/Output Summary (Last 24 hours) at 07/29/2022 [id number] Last data filed at 07/29/2022 [id number] Gross per 24 hour  Intake 1325.5 ml  Output 2075 ml  Net -749.5 ml    Physical Exam: Affect appropriate Healthy:  appears stated age HEENT: normal Neck supple with no adenopathy JVP normal no bruits no thyromegaly Lungs clear with no wheezing and good diaphragmatic motion Heart:  S1/S2 no murmur, no rub, gallop or click PMI normal Abdomen: benighn, BS positve, no tenderness, no AAA no bruit.  No HSM or HJR Distal pulses intact with no bruits No edema Neuro non-focal Skin warm and dry No muscular weakness   Lab Results: Basic Metabolic Panel: Recent Labs    07/27/22 1227  NA 136  K 3.8  CL 106  CO2 25  GLUCOSE 94  BUN 23  CREATININE 1.01  CALCIUM 8.8*    CBC: Recent Labs    07/27/22 1227  WBC 7.0  HGB 16.2  HCT 47.4  MCV 89.4  PLT 216    Hemoglobin A1C: Recent Labs    07/28/22 0102  HGBA1C 5.3     Imaging: ECHOCARDIOGRAM COMPLETE  Result Date: 07/28/2022    ECHOCARDIOGRAM REPORT   Patient Name:   Richard Spence Date of Exam: 07/28/2022 Medical Rec #:  [id number]        Height:       72.0 in Accession #:    [id number]       Weight:       230.0 lb Date of Birth:  07-29-1955        BSA:          2.261 m Patient Age:    68 years         BP:           124/66 mmHg Patient Gender: M                HR:           55 bpm. Exam Location:  Inpatient Procedure: 2D Echo, Cardiac Doppler, Color Doppler and Intracardiac             Opacification Agent Indications:    Chest Pain R07.9  History:        Patient has prior history of Echocardiogram examinations, most                 recent 02/16/2021. Cardiomyopathy, CAD, Arrythmias:Atrial                 Fibrillation and Bradycardia, Signs/Symptoms:Dyspnea; Risk                 Factors:Dyslipidemia.  Sonographer:    Ronny Flurry Referring Phys: [id number] VASUNDHRA RATHORE  Sonographer Comments: Technically difficult study due to poor echo windows, no parasternal window, suboptimal apical window and no subcostal window. IMPRESSIONS  1. Left ventricular ejection fraction, by estimation, is 60 to 65%. The left ventricle  has normal function. Left ventricular endocardial border not optimally defined to evaluate regional wall motion despite the use of Definity contrast. Left ventricular  diastolic parameters are consistent with Grade I diastolic dysfunction (impaired relaxation).  2. Right ventricular systolic function is normal. The right ventricular size is normal.  3. Left atrial size was mildly dilated.  4. The aortic valve was not well visualized. Aortic valve regurgitation is not visualized. No aortic stenosis is present.  5. The mitral valve was not well visualized. No evidence of mitral valve regurgitation. FINDINGS  Left Ventricle: Left ventricular ejection fraction, by estimation, is 60 to 65%. The left ventricle has normal function. Left ventricular endocardial border not optimally defined to evaluate regional wall motion. Definity contrast agent was given IV to delineate the left ventricular endocardial borders. The left ventricular internal cavity size was normal in size. Suboptimal image quality limits for assessment of left ventricular hypertrophy. Left ventricular diastolic parameters are consistent with Grade I diastolic dysfunction (impaired relaxation). Right Ventricle: The right ventricular size is normal. No increase in right ventricular wall thickness. Right ventricular systolic  function is normal. Left Atrium: Left atrial size was mildly dilated. Right Atrium: Right atrial size was normal in size. Pericardium: The pericardium was not well visualized. Mitral Valve: The mitral valve was not well visualized. No evidence of mitral valve regurgitation. Tricuspid Valve: The tricuspid valve is grossly normal. Tricuspid valve regurgitation is not demonstrated. Aortic Valve: The aortic valve was not well visualized. Aortic valve regurgitation is not visualized. No aortic stenosis is present. Aortic valve mean gradient measures 4.0 mmHg. Aortic valve peak gradient measures 8.8 mmHg. Pulmonic Valve: The pulmonic valve was not assessed. Aorta: The aortic root was not well visualized. Venous: The inferior vena cava was not well visualized. IAS/Shunts: The interatrial septum was not assessed.   Diastology LV e' medial:    7.30 cm/s LV E/e' medial:  8.7 LV e' lateral:   10.90 cm/s LV E/e' lateral: 5.8  RIGHT VENTRICLE RV S prime:     11.30 cm/s TAPSE (M-mode): 1.5 cm LEFT ATRIUM           Index LA Vol (A4C): 57.1 ml 25.25 ml/m  AORTIC VALVE AV Vmax:           148.00 cm/s AV Vmean:          95.400 cm/s AV VTI:            0.304 m AV Peak Grad:      8.8 mmHg AV Mean Grad:      4.0 mmHg LVOT Vmax:         128.00 cm/s LVOT Vmean:        79.700 cm/s LVOT VTI:          0.261 m LVOT/AV VTI ratio: 0.86 MITRAL VALVE MV Area (PHT): 3.37 cm    SHUNTS MV Decel Time: 225 msec    Systemic VTI: 0.26 m MV E velocity: 63.60 cm/s MV A velocity: 85.50 cm/s MV E/A ratio:  0.74 Cherlynn Kaiser MD Electronically signed by Cherlynn Kaiser MD Signature Date/Time: 07/28/2022/8:40:16 PM    Final    DG Chest 2 View  Result Date: 07/27/2022 CLINICAL DATA:  Chest pain EXAM: CHEST - 2 VIEW COMPARISON:  Radiograph 02/15/2021 FINDINGS: Unchanged cardiomediastinal silhouette. There is no focal airspace consolidation. There is no pleural effusion or evidence of pneumothorax. No acute osseous abnormality. Thoracic spondylosis.  IMPRESSION: No evidence of acute cardiopulmonary disease. Electronically Signed   By: Maurine Simmering  M.D.   On: 07/27/2022 13:29    Cardiac Studies:  ECG: SR no acute ST changes    Telemetry:  NSR 07/29/2022   Echo: EF 60-65% no RWMA  Medications:    clopidogrel  75 mg Oral Daily   metoprolol tartrate  12.5 mg Oral BID   PARoxetine  40 mg Oral Daily   pregabalin  100 mg Oral Daily   rosuvastatin  20 mg Oral Daily   sodium chloride flush  3 mL Intravenous Q12H      sodium chloride     sodium chloride 1 mL/kg/hr (07/29/22 0604)   heparin 1,450 Units/hr (07/29/22 0604)    Assessment/Plan:  Angina:  prior stent proximal LAD 02/16/21 Did not have dx in RCA/LCX at that time R/O pain relieved with nitro and similar to pre stent pain Shared decision making favor diagnostic cath Eliquis held yesterday and on heparin First case with Dr Irish Lack who did his original stent to be done today  PAF:  he has known CAD and coming in with angina possible restenosis He should not be on flecainide a 1c agent will d/c Cover with heparin and resume eliquis post cath  [facility] 07/29/2022, 6:53 AM

## 2022-07-29 NOTE — Care Management (Signed)
  Transition of Care [facility]) Screening Note   Patient Details  Name: Richard Spence Date of Birth: [birth year]   Transition of Care [facility]) CM/SW Contact:    Bethena Roys, RN Phone Number: 07/29/2022, 1:42 PM    Transition of Care Department [facility]) has reviewed the patient and no TOC needs have been identified at this time. We will continue to monitor patient advancement through interdisciplinary progression rounds. If new patient transition needs arise, please place a TOC consult.

## 2022-07-29 NOTE — Interval H&P Note (Signed)
Cath Lab Visit (complete for each Cath Lab visit)  Clinical Evaluation Leading to the Procedure:   ACS: Yes.    Non-ACS:    Anginal Classification: CCS IV  Anti-ischemic medical therapy: Minimal Therapy (1 class of medications)  Non-Invasive Test Results: No non-invasive testing performed  Prior CABG: No previous CABG      History and Physical Interval Note:  07/29/2022 8:07 AM  Richard Spence  has presented today for surgery, with the diagnosis of angina.  The various methods of treatment have been discussed with the patient and family. After consideration of risks, benefits and other options for treatment, the patient has consented to  Procedure(s): LEFT HEART CATH AND CORONARY ANGIOGRAPHY (N/A) as a surgical intervention.  The patient's history has been reviewed, patient examined, no change in status, stable for surgery.  I have reviewed the patient's chart and labs.  Questions were answered to the patient's satisfaction.     Larae Grooms

## 2022-07-29 NOTE — Progress Notes (Signed)
PROGRESS NOTE    Richard Spence  [id number] DOB: December 05, 1955 DOA: 07/27/2022 PCP: [name]., MD    Brief Narrative:  67 year old gentleman with history of coronary artery disease previous LAD stent, ongoing chronic chest pain present to the ER with substernal chest pain radiating to left arm for many days.  Seen in the emergency room.  Troponins negative.  Was started on heparin infusion due to ongoing chest pain and admitted to the hospital.  Does have a history of paroxysmal A-fib treated with flecainide and Eliquis.  Is on Eliquis and Plavix at home. Admitted with cardiology consultation.  Underwent cardiac cath today.   Assessment & Plan:   Unstable angina in a patient with coronary artery disease and previous stent: Patient admitted to the hospital.  He was treated with heparin infusion and Eliquis was on hold. Underwent cardiac cath today with PCI, previous LAD stent was widely patent.  He was found to have ostial LAD stenosis and drug-eluting stent was placed.  Doing well after the catheterization.  Will need monitoring. Suggested to continue Eliquis, Plavix lifelong.  Aspirin for 30 days. Patient is already on high-dose statin that is continued. Metoprolol 12.5 twice daily. Will refer to cardiac rehab after clinical improvement.  Sinus bradycardia in a patient with paroxysmal A-fib: His heart rate is 45-50.  Cardizem on hold. Patient was on flecainide and this was discontinued due to contraindication with coronary artery disease.  Remains on metoprolol only. Therapeutic on Eliquis.  Currently in sinus rhythm.  Hyperlipidemia: As above.  Mood disorder: On Lyrica and Paxil.   DVT prophylaxis:  apixaban (ELIQUIS) tablet 5 mg   Code Status: Full code Family Communication: Wife at the bedside Disposition Plan: Status is: Inpatient Remains inpatient appropriate because: PCI and stent immediate postop     Consultants:  Cardiology  Procedures:  Cardiac cath and  stenting  Antimicrobials:  None   Subjective: Patient seen in the medical floor after he came back from cath.  Denies any complaints.  Wife at the bedside and she is worried about him not telling her about his symptoms of shortness of breath and chest pain.  Telemetry monitor with sinus rhythm with some heart rate as low as 41.  Objective: Vitals:   07/29/22 1039 07/29/22 1108 07/29/22 1128 07/29/22 1133  BP:  107/62  107/66  Pulse: (!) 44  (!) 43 (!) 41  Resp:    16  Temp:    97.8 F (36.6 C)  TempSrc:    Oral  SpO2: 94%  96% 96%  Weight:      Height:        Intake/Output Summary (Last 24 hours) at 07/29/2022 1258 Last data filed at 07/29/2022 1100 Gross per 24 hour  Intake 1850.8 ml  Output 1650 ml  Net 200.8 ml   Filed Weights   07/27/22 1224 07/28/22 2202  Weight: 104.3 kg 103.1 kg    Examination:  General exam: Appears calm and comfortable, laying in bed. Respiratory system: Clear to auscultation. Respiratory effort normal. Cardiovascular system: S1 & S2 heard, RRR. No JVD, murmurs, rubs, gallops or clicks. No pedal edema. Gastrointestinal system: Abdomen is nondistended, soft and nontender. No organomegaly or masses felt. Normal bowel sounds heard. Central nervous system: Alert and oriented. No focal neurological deficits. Extremities: Symmetric 5 x 5 power. Skin:  Left wrist with compression bandage.  Minimal swelling.    Data Reviewed: I have personally reviewed following labs and imaging studies  CBC: Recent Labs  Lab 07/27/22 1227  WBC 7.0  HGB 16.2  HCT 47.4  MCV 89.4  PLT 123XX123   Basic Metabolic Panel: Recent Labs  Lab 07/27/22 1227  NA 136  K 3.8  CL 106  CO2 25  GLUCOSE 94  BUN 23  CREATININE 1.01  CALCIUM 8.8*   GFR: Estimated Creatinine Clearance: 88.1 mL/min (by C-G formula based on SCr of 1.01 mg/dL). Liver Function Tests: No results for input(s): "AST", "ALT", "ALKPHOS", "BILITOT", "PROT", "ALBUMIN" in the last 168 hours. No  results for input(s): "LIPASE", "AMYLASE" in the last 168 hours. No results for input(s): "AMMONIA" in the last 168 hours. Coagulation Profile: No results for input(s): "INR", "PROTIME" in the last 168 hours. Cardiac Enzymes: No results for input(s): "CKTOTAL", "CKMB", "CKMBINDEX", "TROPONINI" in the last 168 hours. BNP (last 3 results) Recent Labs    08/03/21 1448  PROBNP 53   HbA1C: Recent Labs    07/28/22 0102  HGBA1C 5.3   CBG: No results for input(s): "GLUCAP" in the last 168 hours. Lipid Profile: No results for input(s): "CHOL", "HDL", "LDLCALC", "TRIG", "CHOLHDL", "LDLDIRECT" in the last 72 hours. Thyroid Function Tests: No results for input(s): "TSH", "T4TOTAL", "FREET4", "T3FREE", "THYROIDAB" in the last 72 hours. Anemia Panel: No results for input(s): "VITAMINB12", "FOLATE", "FERRITIN", "TIBC", "IRON", "RETICCTPCT" in the last 72 hours. Sepsis Labs: No results for input(s): "PROCALCITON", "LATICACIDVEN" in the last 168 hours.  No results found for this or any previous visit (from the past 240 hour(s)).       Radiology Studies: CARDIAC CATHETERIZATION  Addendum Date: 07/29/2022     Proximal LAD stent widely patent.   Ost LAD lesion is 75% stenosed just before the proximal LAD stent which was significant by RFR at 0.86..   A drug-eluting stent was successfully placed using a STENT ONYX FRONTIER 3.5X08, postdilated to greater than 4 mm in diameter and optimized with intravascular ultrasound.   Post intervention, there is a 0% residual stenosis.   The left ventricular systolic function is normal.   LV end diastolic pressure is normal.   The left ventricular ejection fraction is 55-65% by visual estimate.   There is no aortic valve stenosis.   Severe left subclavian study which was worse than the right subclavian toward velocity encountered in the past.  Would not use the left radial artery for access. Successful PCI of the ostial LAD with some stent hanging back into the  left main.  Would continue Plavix indefinitely given the location of his stents.  Restart Eliquis tomorrow.  Aspirin for one month.  Continue aggressive preventive therapy.  As noted above, would not use left radial artery for access in the future. Results conveyed to the wife, [name].    Addendum Date: 07/29/2022     Proximal LAD stent widely patent.   Ost LAD lesion is 75% stenosed just before the proximal LAD stent which was significant by RFR at 0.86..   A drug-eluting stent was successfully placed using a STENT ONYX FRONTIER 3.5X08, postdilated to greater than 4 mm in diameter and optimized with intravascular ultrasound.   Post intervention, there is a 0% residual stenosis.   The left ventricular systolic function is normal.   LV end diastolic pressure is normal.   The left ventricular ejection fraction is 55-65% by visual estimate.   There is no aortic valve stenosis.   Severe left subclavian study which was worse than the right subclavian toward velocity encountered in the past.  Would not use the  left radial artery for access. Successful PCI of the ostial LAD with some stent hanging back into the left main.  Would continue Plavix indefinitely given the location of his stents.  Continue aggressive preventive therapy.  As noted above, would not use left radial artery for access in the future. Results conveyed to the wife, [name].    Result Date: 07/29/2022   Proximal LAD stent widely patent.   Ost LAD lesion is 75% stenosed just before the proximal LAD stent which was significant by RFR at 0.86..   A drug-eluting stent was successfully placed using a STENT ONYX FRONTIER 3.5X08, postdilated to greater than 4 mm in diameter and optimized with intravascular ultrasound.   Post intervention, there is a 0% residual stenosis.   The left ventricular systolic function is normal.   LV end diastolic pressure is normal.   The left ventricular ejection fraction is 55-65% by visual estimate.   There is no aortic valve  stenosis.   Severe left subclavian study which was worse than the right subclavian toward velocity encountered in the past.  Would not use the left radial artery for access. Successful PCI of the ostial LAD with some stent hanging back into the left main.  Would continue Plavix indefinitely given the location of his stents.  Continue aggressive preventive therapy.  As noted above, would not use left radial artery for access in the future. Results conveyed to the wife, [name].    ECHOCARDIOGRAM COMPLETE  Result Date: 07/28/2022    ECHOCARDIOGRAM REPORT   Patient Name:   AIRICK GLEED Date of Exam: 07/28/2022 Medical Rec #:  [id number]        Height:       72.0 in Accession #:    [id number]       Weight:       230.0 lb Date of Birth:  1956/03/26        BSA:          2.261 m Patient Age:    46 years         BP:           124/66 mmHg Patient Gender: M                HR:           55 bpm. Exam Location:  Inpatient Procedure: 2D Echo, Cardiac Doppler, Color Doppler and Intracardiac            Opacification Agent Indications:    Chest Pain R07.9  History:        Patient has prior history of Echocardiogram examinations, most                 recent 02/16/2021. Cardiomyopathy, CAD, Arrythmias:Atrial                 Fibrillation and Bradycardia, Signs/Symptoms:Dyspnea; Risk                 Factors:Dyslipidemia.  Sonographer:    Ronny Flurry Referring Phys: [id number] VASUNDHRA RATHORE  Sonographer Comments: Technically difficult study due to poor echo windows, no parasternal window, suboptimal apical window and no subcostal window. IMPRESSIONS  1. Left ventricular ejection fraction, by estimation, is 60 to 65%. The left ventricle has normal function. Left ventricular endocardial border not optimally defined to evaluate regional wall motion despite the use of Definity contrast. Left ventricular  diastolic parameters are consistent with Grade I diastolic dysfunction (impaired relaxation).  2. Right ventricular systolic  function is  normal. The right ventricular size is normal.  3. Left atrial size was mildly dilated.  4. The aortic valve was not well visualized. Aortic valve regurgitation is not visualized. No aortic stenosis is present.  5. The mitral valve was not well visualized. No evidence of mitral valve regurgitation. FINDINGS  Left Ventricle: Left ventricular ejection fraction, by estimation, is 60 to 65%. The left ventricle has normal function. Left ventricular endocardial border not optimally defined to evaluate regional wall motion. Definity contrast agent was given IV to delineate the left ventricular endocardial borders. The left ventricular internal cavity size was normal in size. Suboptimal image quality limits for assessment of left ventricular hypertrophy. Left ventricular diastolic parameters are consistent with Grade I diastolic dysfunction (impaired relaxation). Right Ventricle: The right ventricular size is normal. No increase in right ventricular wall thickness. Right ventricular systolic function is normal. Left Atrium: Left atrial size was mildly dilated. Right Atrium: Right atrial size was normal in size. Pericardium: The pericardium was not well visualized. Mitral Valve: The mitral valve was not well visualized. No evidence of mitral valve regurgitation. Tricuspid Valve: The tricuspid valve is grossly normal. Tricuspid valve regurgitation is not demonstrated. Aortic Valve: The aortic valve was not well visualized. Aortic valve regurgitation is not visualized. No aortic stenosis is present. Aortic valve mean gradient measures 4.0 mmHg. Aortic valve peak gradient measures 8.8 mmHg. Pulmonic Valve: The pulmonic valve was not assessed. Aorta: The aortic root was not well visualized. Venous: The inferior vena cava was not well visualized. IAS/Shunts: The interatrial septum was not assessed.   Diastology LV e' medial:    7.30 cm/s LV E/e' medial:  8.7 LV e' lateral:   10.90 cm/s LV E/e' lateral: 5.8  RIGHT  VENTRICLE RV S prime:     11.30 cm/s TAPSE (M-mode): 1.5 cm LEFT ATRIUM           Index LA Vol (A4C): 57.1 ml 25.25 ml/m  AORTIC VALVE AV Vmax:           148.00 cm/s AV Vmean:          95.400 cm/s AV VTI:            0.304 m AV Peak Grad:      8.8 mmHg AV Mean Grad:      4.0 mmHg LVOT Vmax:         128.00 cm/s LVOT Vmean:        79.700 cm/s LVOT VTI:          0.261 m LVOT/AV VTI ratio: 0.86 MITRAL VALVE MV Area (PHT): 3.37 cm    SHUNTS MV Decel Time: 225 msec    Systemic VTI: 0.26 m MV E velocity: 63.60 cm/s MV A velocity: 85.50 cm/s MV E/A ratio:  0.74 Cherlynn Kaiser MD Electronically signed by Cherlynn Kaiser MD Signature Date/Time: 07/28/2022/8:40:16 PM    Final    DG Chest 2 View  Result Date: 07/27/2022 CLINICAL DATA:  Chest pain EXAM: CHEST - 2 VIEW COMPARISON:  Radiograph 02/15/2021 FINDINGS: Unchanged cardiomediastinal silhouette. There is no focal airspace consolidation. There is no pleural effusion or evidence of pneumothorax. No acute osseous abnormality. Thoracic spondylosis. IMPRESSION: No evidence of acute cardiopulmonary disease. Electronically Signed   By: Maurine Simmering M.D.   On: 07/27/2022 13:29        Scheduled Meds:  [START ON 07/30/2022] apixaban  5 mg Oral BID   [START ON 07/30/2022] aspirin  81 mg Oral Daily   clopidogrel  75 mg Oral  Daily   metoprolol tartrate  12.5 mg Oral BID   PARoxetine  40 mg Oral Daily   pregabalin  100 mg Oral Daily   rosuvastatin  20 mg Oral Daily   sodium chloride flush  3 mL Intravenous Q12H   sodium chloride flush  3 mL Intravenous Q12H   Continuous Infusions:  sodium chloride 100 mL/hr at 07/29/22 1234   sodium chloride       LOS: 1 day    Time spent: 35 minutes    Barb Merino, MD [facility] Pager [phone]

## 2022-07-29 NOTE — Progress Notes (Signed)
ANTICOAGULATION CONSULT NOTE  Pharmacy Consult for heparin Indication: atrial fibrillation  No Known Allergies  Patient Measurements: Height: 6' (182.9 cm) Weight: 103.1 kg (227 lb 4.7 oz) IBW/kg (Calculated) : 77.6 Heparin Dosing Weight: 99.2 kg   Vital Signs: Temp: 98.3 F (36.8 C) (02/22 2350) Temp Source: Oral (02/22 2350) BP: 130/68 (02/22 2350) Pulse Rate: 53 (02/22 2350)  Labs: Recent Labs    07/27/22 1227 07/27/22 1551 07/28/22 0102 07/28/22 0450 07/28/22 0836 07/28/22 1441 07/29/22 0041  HGB 16.2  --   --   --   --   --   --   HCT 47.4  --   --   --   --   --   --   PLT 216  --   --   --   --   --   --   APTT  --   --   --   --   --  60* 128*  HEPARINUNFRC  --   --   --   --   --  >1.10*  --   CREATININE 1.01  --   --   --   --   --   --   TROPONINIHS 3   < > '4 5 3  '$ --   --    < > = values in this interval not displayed.     Estimated Creatinine Clearance: 88.1 mL/min (by C-G formula based on SCr of 1.01 mg/dL).   Medical History: Past Medical History:  Diagnosis Date   Abnormal stress test 10/21/2019   Acute pain of left knee 04/14/2020   Altered mental status 10/25/2018   Atrial fibrillation with rapid ventricular response ([place]) 10/08/2018   Atrial fibrillation with rapid ventricular response ([place]) 10/08/2018   Cardiomyopathy ([place]) 10/25/2018   EF 25-30% on ST. 40% on ECHO 2020.  LVH   Chronic pain syndrome 04/27/2020   Coronary artery disease of native artery of native heart with stable angina pectoris ([place]) 12/02/2019   Formatting of this note might be different from the original. 10/2019. Cath at [facility] LAD 45%. EF 55-60%   Degeneration of lumbar intervertebral disc 07/30/2015   DOE (dyspnea on exertion) 10/16/2018   Facet arthropathy, cervical 01/30/2020   Formatting of this note might be different from the original. Added automatically from request for surgery [id number]  Formatting of this note might be different from the original. Added automatically from  request for surgery [id number]   Fatigue 06/06/2019   Hearing loss 10/15/2018   High risk medication use 07/30/2015   Long-term use of aspirin therapy 10/15/2018   Malaise and fatigue 10/25/2018   Mild cognitive impairment 12/10/2018   Formatting of this note might be different from the original. Okay on eval   Mild episode of recurrent major depressive disorder ([place]) 07/30/2015   Last Assessment & Plan:  Relevant Hx: Course: Daily Update: Today's Plan:will refill his med for him today and he is stable with this  Electronically signed by: Baldemar Friday, FNP 07/30/15 [phone]   Mixed hyperlipidemia 07/30/2015   Pain of cervical facet joint 01/30/2020   Formatting of this note might be different from the original. Added automatically from request for surgery [id number]   Paroxysmal atrial fibrillation ([place]) 05/06/2019   Permanent atrial fibrillation ([place]) 10/08/2018   Persistent atrial fibrillation, status post cardioversion in July 2020 about flip back to atrial fibrillation a few weeks later 01/21/2019   Postlaminectomy syndrome of lumbar region 04/27/2020   Postural dizziness  with presyncope 10/25/2018   Prediabetes 07/30/2015   Right shoulder injury 10/08/2018   S/P arthroscopy of right shoulder 03/12/2019   S/P rotator cuff repair 03/12/2019   SI (sacroiliac) pain 07/27/2020   Tear of right supraspinatus tendon 10/25/2018   Tremor 07/30/2015   Vitamin B 12 deficiency 07/30/2015   White matter disease, unspecified 11/19/2018   Noted on MRI of brain 2020. Discussed with pt and wife.    Medications:  Scheduled:   aspirin  81 mg Oral Pre-Cath   clopidogrel  75 mg Oral Daily   metoprolol tartrate  12.5 mg Oral BID   PARoxetine  40 mg Oral Daily   pregabalin  100 mg Oral Daily   rosuvastatin  20 mg Oral Daily   sodium chloride flush  3 mL Intravenous Q12H    Assessment: 11 yom presenting with angina - on apixaban PTA for hx Afib (LD 2/21'@2006'$ ). Plan to change to heparin infusion for procedures.    Given recent timing of DOAC, will monitor both heparin level and aPTT until correlate.   Hgb 16.2, plt 216. Trop 3>5. No s/sx of bleeding.  2/23 AM update:  aPTT supra-therapeutic   Goal of Therapy:  Heparin level 0.3-0.7 units/ml aPTT 66-102 seconds Monitor platelets by anticoagulation protocol: Yes   Plan:  Dec heparin to 1450 units/hr 1100 aPTT and heparin level  Narda Bonds, PharmD, BCPS Clinical Pharmacist Phone: [phone]

## 2022-07-29 NOTE — Discharge Instructions (Addendum)
Information about your medication: Plavix (anti-platelet agent)  Generic Name (Brand): clopidogrel (Plavix), once daily medication  PURPOSE: You are taking this medication along with aspirin to lower your chance of having a heart attack, stroke, or blood clots in your heart stent. These can be fatal. Plavix and aspirin help prevent platelets from sticking together and forming a clot that can block an artery or your stent.   Common SIDE EFFECTS you may experience include: bruising or bleeding more easily, shortness of breath  Do not stop taking PLAVIX without talking to the doctor who prescribes it for you. People who are treated with a stent and stop taking Plavix too soon, have a higher risk of getting a blood clot in the stent, having a heart attack, or dying. If you stop Plavix because of bleeding, or for other reasons, your risk of a heart attack or stroke may increase.   Avoid taking NSAID agents or anti-inflammatory medications such as ibuprofen, naproxen given increased bleed risk with plavix - can use acetaminophen (Tylenol) if needed for pain.  Avoid taking over the counter stomach medications omeprazole (Prilosec) or esomeprazole (Nexium) since these do interact and make plavix less effective - ask your pharmacist or doctor for alterative agents if needed for heartburn or GERD.   Tell all of your doctors and dentists that you are taking Plavix. They should talk to the doctor who prescribed Plavix for you before you have any surgery or invasive procedure.   Contact your health care provider if you experience: severe or uncontrollable bleeding, pink/red/brown urine, vomiting blood or vomit that looks like "coffee grounds", red or black stools (looks like tar), coughing up blood or blood clots ----------------------------------------------------------------------------------------------------------------------    De-escalation of Triple Therapy Post-PCI  Underwent cardiac catheterization  with placement of drug-eluting stent on 07/29/2022. Plan for triple therapy with aspirin 81 mg and clopidogrel (Plavix) in addition to oral anticoagulation using apixaban (Eliquis). Plan to discontinue aspirin on 08/28/2022.

## 2022-07-29 NOTE — Progress Notes (Signed)
Pt educated on exercise guidelines, risk factors, heart healthy nutrition, antiplatelet medication, NTG use, and CRP II. Pt referred to [facility].   07/29/22 2:05 PM  Service time is from [address] to [address] BSRT

## 2022-07-29 NOTE — H&P (View-Only) (Signed)
Subjective:  Denies SSCP, palpitations or Dyspnea For cath first case this am with Dr Irish Lack  Objective:  Vitals:   07/28/22 2013 07/28/22 2202 07/28/22 2350 07/29/22 0436  BP: 129/68  130/68 127/73  Pulse: 60  (!) 53 (!) 51  Resp: '18  18 18  '$ Temp: 97.7 F (36.5 C)  98.3 F (36.8 C) 97.8 F (36.6 C)  TempSrc: Oral  Oral Oral  SpO2: 94%  93% 92%  Weight:  103.1 kg    Height:        Intake/Output from previous day:  Intake/Output Summary (Last 24 hours) at 07/29/2022 [id number] Last data filed at 07/29/2022 [id number] Gross per 24 hour  Intake 1325.5 ml  Output 2075 ml  Net -749.5 ml    Physical Exam: Affect appropriate Healthy:  appears stated age HEENT: normal Neck supple with no adenopathy JVP normal no bruits no thyromegaly Lungs clear with no wheezing and good diaphragmatic motion Heart:  S1/S2 no murmur, no rub, gallop or click PMI normal Abdomen: benighn, BS positve, no tenderness, no AAA no bruit.  No HSM or HJR Distal pulses intact with no bruits No edema Neuro non-focal Skin warm and dry No muscular weakness   Lab Results: Basic Metabolic Panel: Recent Labs    07/27/22 1227  NA 136  K 3.8  CL 106  CO2 25  GLUCOSE 94  BUN 23  CREATININE 1.01  CALCIUM 8.8*    CBC: Recent Labs    07/27/22 1227  WBC 7.0  HGB 16.2  HCT 47.4  MCV 89.4  PLT 216    Hemoglobin A1C: Recent Labs    07/28/22 0102  HGBA1C 5.3     Imaging: ECHOCARDIOGRAM COMPLETE  Result Date: 07/28/2022    ECHOCARDIOGRAM REPORT   Patient Name:   Richard Spence Date of Exam: 07/28/2022 Medical Rec #:  [id number]        Height:       72.0 in Accession #:    [id number]       Weight:       230.0 lb Date of Birth:  07/14/1955        BSA:          2.261 m Patient Age:    67 years         BP:           124/66 mmHg Patient Gender: M                HR:           55 bpm. Exam Location:  Inpatient Procedure: 2D Echo, Cardiac Doppler, Color Doppler and Intracardiac             Opacification Agent Indications:    Chest Pain R07.9  History:        Patient has prior history of Echocardiogram examinations, most                 recent 02/16/2021. Cardiomyopathy, CAD, Arrythmias:Atrial                 Fibrillation and Bradycardia, Signs/Symptoms:Dyspnea; Risk                 Factors:Dyslipidemia.  Sonographer:    Ronny Flurry Referring Phys: [id number] VASUNDHRA RATHORE  Sonographer Comments: Technically difficult study due to poor echo windows, no parasternal window, suboptimal apical window and no subcostal window. IMPRESSIONS  1. Left ventricular ejection fraction, by estimation, is 60 to 65%. The left ventricle  has normal function. Left ventricular endocardial border not optimally defined to evaluate regional wall motion despite the use of Definity contrast. Left ventricular  diastolic parameters are consistent with Grade I diastolic dysfunction (impaired relaxation).  2. Right ventricular systolic function is normal. The right ventricular size is normal.  3. Left atrial size was mildly dilated.  4. The aortic valve was not well visualized. Aortic valve regurgitation is not visualized. No aortic stenosis is present.  5. The mitral valve was not well visualized. No evidence of mitral valve regurgitation. FINDINGS  Left Ventricle: Left ventricular ejection fraction, by estimation, is 60 to 65%. The left ventricle has normal function. Left ventricular endocardial border not optimally defined to evaluate regional wall motion. Definity contrast agent was given IV to delineate the left ventricular endocardial borders. The left ventricular internal cavity size was normal in size. Suboptimal image quality limits for assessment of left ventricular hypertrophy. Left ventricular diastolic parameters are consistent with Grade I diastolic dysfunction (impaired relaxation). Right Ventricle: The right ventricular size is normal. No increase in right ventricular wall thickness. Right ventricular systolic  function is normal. Left Atrium: Left atrial size was mildly dilated. Right Atrium: Right atrial size was normal in size. Pericardium: The pericardium was not well visualized. Mitral Valve: The mitral valve was not well visualized. No evidence of mitral valve regurgitation. Tricuspid Valve: The tricuspid valve is grossly normal. Tricuspid valve regurgitation is not demonstrated. Aortic Valve: The aortic valve was not well visualized. Aortic valve regurgitation is not visualized. No aortic stenosis is present. Aortic valve mean gradient measures 4.0 mmHg. Aortic valve peak gradient measures 8.8 mmHg. Pulmonic Valve: The pulmonic valve was not assessed. Aorta: The aortic root was not well visualized. Venous: The inferior vena cava was not well visualized. IAS/Shunts: The interatrial septum was not assessed.   Diastology LV e' medial:    7.30 cm/s LV E/e' medial:  8.7 LV e' lateral:   10.90 cm/s LV E/e' lateral: 5.8  RIGHT VENTRICLE RV S prime:     11.30 cm/s TAPSE (M-mode): 1.5 cm LEFT ATRIUM           Index LA Vol (A4C): 57.1 ml 25.25 ml/m  AORTIC VALVE AV Vmax:           148.00 cm/s AV Vmean:          95.400 cm/s AV VTI:            0.304 m AV Peak Grad:      8.8 mmHg AV Mean Grad:      4.0 mmHg LVOT Vmax:         128.00 cm/s LVOT Vmean:        79.700 cm/s LVOT VTI:          0.261 m LVOT/AV VTI ratio: 0.86 MITRAL VALVE MV Area (PHT): 3.37 cm    SHUNTS MV Decel Time: 225 msec    Systemic VTI: 0.26 m MV E velocity: 63.60 cm/s MV A velocity: 85.50 cm/s MV E/A ratio:  0.74 Cherlynn Kaiser MD Electronically signed by Cherlynn Kaiser MD Signature Date/Time: 07/28/2022/8:40:16 PM    Final    DG Chest 2 View  Result Date: 07/27/2022 CLINICAL DATA:  Chest pain EXAM: CHEST - 2 VIEW COMPARISON:  Radiograph 02/15/2021 FINDINGS: Unchanged cardiomediastinal silhouette. There is no focal airspace consolidation. There is no pleural effusion or evidence of pneumothorax. No acute osseous abnormality. Thoracic spondylosis.  IMPRESSION: No evidence of acute cardiopulmonary disease. Electronically Signed   By: Maurine Simmering  M.D.   On: 07/27/2022 13:29    Cardiac Studies:  ECG: SR no acute ST changes    Telemetry:  NSR 07/29/2022   Echo: EF 60-65% no RWMA  Medications:    clopidogrel  75 mg Oral Daily   metoprolol tartrate  12.5 mg Oral BID   PARoxetine  40 mg Oral Daily   pregabalin  100 mg Oral Daily   rosuvastatin  20 mg Oral Daily   sodium chloride flush  3 mL Intravenous Q12H      sodium chloride     sodium chloride 1 mL/kg/hr (07/29/22 0604)   heparin 1,450 Units/hr (07/29/22 0604)    Assessment/Plan:  Angina:  prior stent proximal LAD 02/16/21 Did not have dx in RCA/LCX at that time R/O pain relieved with nitro and similar to pre stent pain Shared decision making favor diagnostic cath Eliquis held yesterday and on heparin First case with Dr Irish Lack who did his original stent to be done today  PAF:  he has known CAD and coming in with angina possible restenosis He should not be on flecainide a 1c agent will d/c Cover with heparin and resume eliquis post cath  [facility] 07/29/2022, 6:53 AM

## 2022-07-29 NOTE — Plan of Care (Signed)
  Problem: Education: Goal: Knowledge of General Education information will improve Description: Including pain rating scale, medication(s)/side effects and non-pharmacologic comfort measures Outcome: Progressing   Problem: Health Behavior/Discharge Planning: Goal: Ability to manage health-related needs will improve Outcome: Progressing   Problem: Clinical Measurements: Goal: Cardiovascular complication will be avoided Outcome: Progressing   Problem: Pain Managment: Goal: General experience of comfort will improve Outcome: Progressing   Problem: Safety: Goal: Ability to remain free from injury will improve Outcome: Progressing   Problem: Cardiovascular: Goal: Ability to achieve and maintain adequate cardiovascular perfusion will improve Outcome: Progressing Goal: Vascular access site(s) Level 0-1 will be maintained Outcome: Progressing   

## 2022-07-30 DIAGNOSIS — R079 Chest pain, unspecified: Secondary | ICD-10-CM | POA: Diagnosis not present

## 2022-07-30 DIAGNOSIS — R001 Bradycardia, unspecified: Secondary | ICD-10-CM | POA: Diagnosis not present

## 2022-07-30 DIAGNOSIS — E782 Mixed hyperlipidemia: Secondary | ICD-10-CM | POA: Diagnosis not present

## 2022-07-30 DIAGNOSIS — I480 Paroxysmal atrial fibrillation: Secondary | ICD-10-CM | POA: Diagnosis not present

## 2022-07-30 DIAGNOSIS — I2510 Atherosclerotic heart disease of native coronary artery without angina pectoris: Secondary | ICD-10-CM | POA: Diagnosis not present

## 2022-07-30 LAB — BASIC METABOLIC PANEL
Anion gap: 8 (ref 5–15)
BUN: 19 mg/dL (ref 8–23)
CO2: 25 mmol/L (ref 22–32)
Calcium: 8.5 mg/dL — ABNORMAL LOW (ref 8.9–10.3)
Chloride: 105 mmol/L (ref 98–111)
Creatinine, Ser: 1.01 mg/dL (ref 0.61–1.24)
GFR, Estimated: >60 mL/min (ref >60)
Glucose, Bld: 91 mg/dL (ref 70–99)
Potassium: 4 mmol/L (ref 3.5–5.1)
Sodium: 138 mmol/L (ref 135–145)

## 2022-07-30 LAB — CBC
HCT: 44.3 % (ref 39.0–52.0)
Hemoglobin: 15 g/dL (ref 13.0–17.0)
MCH: 30.9 pg (ref 26.0–34.0)
MCHC: 33.9 g/dL (ref 30.0–36.0)
MCV: 91.2 fL (ref 80.0–100.0)
Platelets: 186 10*3/uL (ref 150–400)
RBC: 4.86 MIL/uL (ref 4.22–5.81)
RDW: 13.7 % (ref 11.5–15.5)
WBC: 9.1 10*3/uL (ref 4.0–10.5)
nRBC: 0 % (ref 0.0–0.2)

## 2022-07-30 MED ORDER — PREGABALIN 75 MG PO CAPS: 150.0000 mg | ORAL_CAPSULE | Freq: Every day | ORAL | Status: AC

## 2022-07-30 MED ORDER — ASPIRIN 81 MG PO CHEW: 81.0000 mg | CHEWABLE_TABLET | Freq: Every day | ORAL | 0 refills | Status: AC

## 2022-07-30 MED ORDER — METOPROLOL TARTRATE 25 MG PO TABS: 12.5000 mg | ORAL_TABLET | Freq: Two times a day (BID) | ORAL | 1 refills | Status: DC

## 2022-07-30 NOTE — Discharge Summary (Signed)
Physician Discharge Summary  Richard Spence [id number] DOB: [birth year] DOA: 07/27/2022  PCP: Richard Mina., MD  Admit date: 07/27/2022 Discharge date: 07/30/2022  Time spent: 40 minutes  Recommendations for Outpatient Follow-up:  Follow outpatient CBC/CMP  Aspirin x30 days Follow with cardiology outpatient - follow bradycardia, afib sx, LDL  Discharge Diagnoses:  Principal Problem:   Chest pain Active Problems:   HLD (hyperlipidemia)   Prediabetes   Paroxysmal atrial fibrillation (HCC)   CAD S/P percutaneous coronary angioplasty   Chronic pain   Sinus bradycardia   Mood disorder ([name])   Unstable angina ([name])   Discharge Condition: stable  Diet recommendation: heart healthy  Filed Weights   07/27/22 1224 07/28/22 2202  Weight: 104.3 kg 103.1 kg    History of present illness:   67 year old gentleman with history of coronary artery disease previous LAD stent, ongoing chronic chest pain present to the ER with substernal chest pain radiating to left arm for many days.  Seen in the emergency room.  Troponins negative.  Was started on heparin infusion due to ongoing chest pain and admitted to the hospital.  Does have Richard Spence history of paroxysmal Richard Spence treated with flecainide and Eliquis.  Is on Eliquis and Plavix at home. Admitted with cardiology consultation.  s/p LHC now s/p stent placement  See below for additional details  Hospital Course:  Assessment and Plan:  Unstable angina in Richard Spence patient with coronary artery disease and previous stent: Patient admitted to the hospital.  He was treated with heparin infusion and Eliquis was on hold. Underwent cardiac cath today with PCI, previous LAD stent was widely patent.  He was found to have ostial LAD stenosis and drug-eluting stent was placed.  Doing well after the catheterization.  Will need monitoring. Suggested to continue Eliquis, Plavix indefinite.  Aspirin for 30 days.  Crestor. Metoprolol 12.5 twice daily. cardiac rehab     Sinus bradycardia in Richard Spence patient with paroxysmal Richard Spence-fib:  Metoprolol  Outpatient cardiology follow up for brady   Hyperlipidemia: crestor    Mood disorder: On Lyrica and Paxil.    Procedures: Echo IMPRESSIONS     1. Left ventricular ejection fraction, by estimation, is 60 to 65%. The  left ventricle has normal function. Left ventricular endocardial border  not optimally defined to evaluate regional wall motion despite the use of  Definity contrast. Left ventricular   diastolic parameters are consistent with Grade I diastolic dysfunction  (impaired relaxation).   2. Right ventricular systolic function is normal. The right ventricular  size is normal.   3. Left atrial size was mildly dilated.   4. The aortic valve was not well visualized. Aortic valve regurgitation  is not visualized. No aortic stenosis is present.   5. The mitral valve was not well visualized. No evidence of mitral valve  regurgitation.   LHC   Proximal LAD stent widely patent.   Ost LAD lesion is 75% stenosed just before the proximal LAD stent which was significant by RFR at 0.86..   Richard Spence drug-eluting stent was successfully placed using Richard Spence STENT ONYX FRONTIER 3.5X08, postdilated to greater than 4 mm in diameter and optimized with intravascular ultrasound.   Post intervention, there is Richard Spence 0% residual stenosis.   The left ventricular systolic function is normal.   LV end diastolic pressure is normal.   The left ventricular ejection fraction is 55-65% by visual estimate.   There is no aortic valve stenosis.   Severe left subclavian study which was worse than the  right subclavian toward velocity encountered in the past.  Would not use the left radial artery for access.   Successful PCI of the ostial LAD with some stent hanging back into the left main.  Would continue Plavix indefinitely given the location of his stents.  Restart Eliquis tomorrow.  Aspirin for one month.  Continue aggressive preventive therapy.  As  noted above, would not use left radial artery for access in the future.   Results conveyed to the wife, Richard Spence.    Consultations: cardiolgy  Discharge Exam: Vitals:   07/30/22 0431 07/30/22 0912  BP: 112/70 (!) 141/74  Pulse: (!) 52 67  Resp: 18 16  Temp: 97.6 F (36.4 C) 98.4 F (36.9 C)  SpO2: 93% 95%   No complaints Eager to discharge  General: No acute distress. Cardiovascular: RRR Lungs: unlabored Abdomen: Soft, nontender, nondistended  Neurological: Alert and oriented 3. Moves all extremities 4 with equal strength. Cranial nerves II through XII grossly intact. Extremities: No clubbing or cyanosis. No edema.  Discharge Instructions   Discharge Instructions     Amb Referral to Cardiac Rehabilitation   Complete by: As directed    Referral to [facility]   Diagnosis:  Coronary Stents PTCA     After initial evaluation and assessments completed: Virtual Based Care may be provided alone or in conjunction with Phase 2 Cardiac Rehab based on patient barriers.: Yes   Intensive Cardiac Rehabilitation ([place]) [place] location only OR Traditional Cardiac Rehabilitation (TCR) *If criteria for ICR are not met will enroll in TCR [facility] only): Yes   Call MD for:  difficulty breathing, headache or visual disturbances   Complete by: As directed    Call MD for:  extreme fatigue   Complete by: As directed    Call MD for:  hives   Complete by: As directed    Call MD for:  persistant dizziness or light-headedness   Complete by: As directed    Call MD for:  persistant nausea and vomiting   Complete by: As directed    Call MD for:  redness, tenderness, or signs of infection (pain, swelling, redness, odor or green/yellow discharge around incision site)   Complete by: As directed    Call MD for:  severe uncontrolled pain   Complete by: As directed    Call MD for:  temperature >100.4   Complete by: As directed    Diet - low sodium heart healthy   Complete by: As directed    Discharge  instructions   Complete by: As directed    You were seen for chest pain and unstable angina.  You had Richard Spence cardiac cathterization and stent placement.  You'll go home with aspirin for Stanisha Lorenz month.  Continue the eliquis and plavix.  Follow up with cardiology as an outpatient.    We've switched you from coreg to metoprolol.  Pick this up as an outpatient.    I sent your medicines to [facility] since [facility] is closed.  Return for new, recurrent, or worsening symptoms.  Please ask your PCP to request records from this hospitalization so they know what was done and what the next steps will be.   Increase activity slowly   Complete by: As directed       Allergies as of 07/30/2022   No Known Allergies      Medication List     STOP taking these medications    carvedilol 3.125 MG tablet Commonly known as: COREG   diltiazem 120 MG 24  hr capsule Commonly known as: CARDIZEM CD   flecainide 50 MG tablet Commonly known as: TAMBOCOR       TAKE these medications    apixaban 5 MG Tabs tablet Commonly known as: ELIQUIS Take 1 tablet (5 mg total) by mouth 2 (two) times daily.   aspirin 81 MG chewable tablet Chew 1 tablet (81 mg total) by mouth daily. Start taking on: July 31, 2022   clopidogrel 75 MG tablet Commonly known as: PLAVIX TAKE ONE (1) TABLET BY MOUTH EVERY DAY What changed: how much to take   cyanocobalamin 1000 MCG tablet Commonly known as: VITAMIN B12 Take 1,000 mcg by mouth daily.   cyclobenzaprine 10 MG tablet Commonly known as: FLEXERIL Take 10 mg by mouth 2 (two) times daily as needed for muscle spasms.   metoprolol tartrate 25 MG tablet Commonly known as: LOPRESSOR Take 0.5 tablets (12.5 mg total) by mouth 2 (two) times daily.   nitroGLYCERIN 0.4 MG SL tablet Commonly known as: NITROSTAT Place 1 tablet (0.4 mg total) under the tongue every 5 (five) minutes as needed. What changed: reasons to take this   PARoxetine 40 MG tablet Commonly known as:  PAXIL Take 40 mg by mouth daily.   pregabalin 75 MG capsule Commonly known as: LYRICA Take 2 capsules (150 mg total) by mouth daily. What changed: how much to take   rosuvastatin 20 MG tablet Commonly known as: CRESTOR TAKE ONE TABLET BY MOUTH EVERY DAY   Vitamin D3 125 MCG (5000 UT) capsule Generic drug: Cholecalciferol Take 5,000 Units by mouth daily.       No Known Allergies    The results of significant diagnostics from this hospitalization (including imaging, microbiology, ancillary and laboratory) are listed below for reference.    Significant Diagnostic Studies: CARDIAC CATHETERIZATION  Addendum Date: 07/29/2022     Proximal LAD stent widely patent.   Ost LAD lesion is 75% stenosed just before the proximal LAD stent which was significant by RFR at 0.86..   Danell Vazquez drug-eluting stent was successfully placed using Alfard Cochrane STENT ONYX FRONTIER 3.5X08, postdilated to greater than 4 mm in diameter and optimized with intravascular ultrasound.   Post intervention, there is Lalah Durango 0% residual stenosis.   The left ventricular systolic function is normal.   LV end diastolic pressure is normal.   The left ventricular ejection fraction is 55-65% by visual estimate.   There is no aortic valve stenosis.   Severe left subclavian study which was worse than the right subclavian toward velocity encountered in the past.  Would not use the left radial artery for access. Successful PCI of the ostial LAD with some stent hanging back into the left main.  Would continue Plavix indefinitely given the location of his stents.  Restart Eliquis tomorrow.  Aspirin for one month.  Continue aggressive preventive therapy.  As noted above, would not use left radial artery for access in the future. Results conveyed to the wife, Richard Spence.    Addendum Date: 07/29/2022     Proximal LAD stent widely patent.   Ost LAD lesion is 75% stenosed just before the proximal LAD stent which was significant by RFR at 0.86..   Eythan Jayne drug-eluting stent was  successfully placed using Miabella Shannahan STENT ONYX FRONTIER 3.5X08, postdilated to greater than 4 mm in diameter and optimized with intravascular ultrasound.   Post intervention, there is Merl Bommarito 0% residual stenosis.   The left ventricular systolic function is normal.   LV end diastolic pressure is normal.   The  left ventricular ejection fraction is 55-65% by visual estimate.   There is no aortic valve stenosis.   Severe left subclavian study which was worse than the right subclavian toward velocity encountered in the past.  Would not use the left radial artery for access. Successful PCI of the ostial LAD with some stent hanging back into the left main.  Would continue Plavix indefinitely given the location of his stents.  Continue aggressive preventive therapy.  As noted above, would not use left radial artery for access in the future. Results conveyed to the wife, Richard Spence.    Result Date: 07/29/2022   Proximal LAD stent widely patent.   Ost LAD lesion is 75% stenosed just before the proximal LAD stent which was significant by RFR at 0.86..   Kelsha Older drug-eluting stent was successfully placed using Srijan Givan STENT ONYX FRONTIER 3.5X08, postdilated to greater than 4 mm in diameter and optimized with intravascular ultrasound.   Post intervention, there is Daylynn Stumpp 0% residual stenosis.   The left ventricular systolic function is normal.   LV end diastolic pressure is normal.   The left ventricular ejection fraction is 55-65% by visual estimate.   There is no aortic valve stenosis.   Severe left subclavian study which was worse than the right subclavian toward velocity encountered in the past.  Would not use the left radial artery for access. Successful PCI of the ostial LAD with some stent hanging back into the left main.  Would continue Plavix indefinitely given the location of his stents.  Continue aggressive preventive therapy.  As noted above, would not use left radial artery for access in the future. Results conveyed to the wife, Richard Spence.     ECHOCARDIOGRAM COMPLETE  Result Date: 07/28/2022    ECHOCARDIOGRAM REPORT   Patient Name:   Richard Spence Date of Exam: 07/28/2022 Medical Rec #:  [id number]        Height:       72.0 in Accession #:    [id number]       Weight:       230.0 lb Date of Birth:  1955/08/15        BSA:          2.261 m Patient Age:    64 years         BP:           124/66 mmHg Patient Gender: M                HR:           55 bpm. Exam Location:  Inpatient Procedure: 2D Echo, Cardiac Doppler, Color Doppler and Intracardiac            Opacification Agent Indications:    Chest Pain R07.9  History:        Patient has prior history of Echocardiogram examinations, most                 recent 02/16/2021. Cardiomyopathy, CAD, Arrythmias:Atrial                 Fibrillation and Bradycardia, Signs/Symptoms:Dyspnea; Risk                 Factors:Dyslipidemia.  Sonographer:    Ronny Flurry Referring Phys: [id number] VASUNDHRA RATHORE  Sonographer Comments: Technically difficult study due to poor echo windows, no parasternal window, suboptimal apical window and no subcostal window. IMPRESSIONS  1. Left ventricular ejection fraction, by estimation, is 60 to 65%. The left ventricle has normal  function. Left ventricular endocardial border not optimally defined to evaluate regional wall motion despite the use of Definity contrast. Left ventricular  diastolic parameters are consistent with Grade I diastolic dysfunction (impaired relaxation).  2. Right ventricular systolic function is normal. The right ventricular size is normal.  3. Left atrial size was mildly dilated.  4. The aortic valve was not well visualized. Aortic valve regurgitation is not visualized. No aortic stenosis is present.  5. The mitral valve was not well visualized. No evidence of mitral valve regurgitation. FINDINGS  Left Ventricle: Left ventricular ejection fraction, by estimation, is 60 to 65%. The left ventricle has normal function. Left ventricular endocardial border not  optimally defined to evaluate regional wall motion. Definity contrast agent was given IV to delineate the left ventricular endocardial borders. The left ventricular internal cavity size was normal in size. Suboptimal image quality limits for assessment of left ventricular hypertrophy. Left ventricular diastolic parameters are consistent with Grade I diastolic dysfunction (impaired relaxation). Right Ventricle: The right ventricular size is normal. No increase in right ventricular wall thickness. Right ventricular systolic function is normal. Left Atrium: Left atrial size was mildly dilated. Right Atrium: Right atrial size was normal in size. Pericardium: The pericardium was not well visualized. Mitral Valve: The mitral valve was not well visualized. No evidence of mitral valve regurgitation. Tricuspid Valve: The tricuspid valve is grossly normal. Tricuspid valve regurgitation is not demonstrated. Aortic Valve: The aortic valve was not well visualized. Aortic valve regurgitation is not visualized. No aortic stenosis is present. Aortic valve mean gradient measures 4.0 mmHg. Aortic valve peak gradient measures 8.8 mmHg. Pulmonic Valve: The pulmonic valve was not assessed. Aorta: The aortic root was not well visualized. Venous: The inferior vena cava was not well visualized. IAS/Shunts: The interatrial septum was not assessed.   Diastology LV e' medial:    7.30 cm/s LV E/e' medial:  8.7 LV e' lateral:   10.90 cm/s LV E/e' lateral: 5.8  RIGHT VENTRICLE RV S prime:     11.30 cm/s TAPSE (M-mode): 1.5 cm LEFT ATRIUM           Index LA Vol (A4C): 57.1 ml 25.25 ml/m  AORTIC VALVE AV Vmax:           148.00 cm/s AV Vmean:          95.400 cm/s AV VTI:            0.304 m AV Peak Grad:      8.8 mmHg AV Mean Grad:      4.0 mmHg LVOT Vmax:         128.00 cm/s LVOT Vmean:        79.700 cm/s LVOT VTI:          0.261 m LVOT/AV VTI ratio: 0.86 MITRAL VALVE MV Area (PHT): 3.37 cm    SHUNTS MV Decel Time: 225 msec    Systemic VTI: 0.26  m MV E velocity: 63.60 cm/s MV Earnest Thalman velocity: 85.50 cm/s MV E/Kiaraliz Rafuse ratio:  0.74 Cherlynn Kaiser MD Electronically signed by Cherlynn Kaiser MD Signature Date/Time: 07/28/2022/8:40:16 PM    Final    DG Chest 2 View  Result Date: 07/27/2022 CLINICAL DATA:  Chest pain EXAM: CHEST - 2 VIEW COMPARISON:  Radiograph 02/15/2021 FINDINGS: Unchanged cardiomediastinal silhouette. There is no focal airspace consolidation. There is no pleural effusion or evidence of pneumothorax. No acute osseous abnormality. Thoracic spondylosis. IMPRESSION: No evidence of acute cardiopulmonary disease. Electronically Signed   By: Ileene Patrick.D.  On: 07/27/2022 13:29   DG Foot Complete Left  Result Date: 07/07/2022 Please see detailed radiograph report in office note.   Microbiology: No results found for this or any previous visit (from the past 240 hour(s)).   Labs: Basic Metabolic Panel: Recent Labs  Lab 07/27/22 1227 07/30/22 0224  NA 136 138  K 3.8 4.0  CL 106 105  CO2 25 25  GLUCOSE 94 91  BUN 23 19  CREATININE 1.01 1.01  CALCIUM 8.8* 8.5*   Liver Function Tests: No results for input(s): "AST", "ALT", "ALKPHOS", "BILITOT", "PROT", "ALBUMIN" in the last 168 hours. No results for input(s): "LIPASE", "AMYLASE" in the last 168 hours. No results for input(s): "AMMONIA" in the last 168 hours. CBC: Recent Labs  Lab 07/27/22 1227 07/30/22 0224  WBC 7.0 9.1  HGB 16.2 15.0  HCT 47.4 44.3  MCV 89.4 91.2  PLT 216 186   Cardiac Enzymes: No results for input(s): "CKTOTAL", "CKMB", "CKMBINDEX", "TROPONINI" in the last 168 hours. BNP: BNP (last 3 results) No results for input(s): "BNP" in the last 8760 hours.  ProBNP (last 3 results) Recent Labs    08/03/21 1448  PROBNP 53    CBG: No results for input(s): "GLUCAP" in the last 168 hours.     Signed:  Fayrene Helper MD.  [facility] 07/30/2022, 12:59 PM

## 2022-07-30 NOTE — Progress Notes (Signed)
CARDIAC REHAB PHASE I   PRE:  Rate/Rhythm: 58 SB  BP:  Sitting: 120/75      SaO2: 95 RA  MODE:  Ambulation: 470 ft   POST:  Rate/Rhythm: 77 SR  BP:  Sitting: 145/79      SaO2: 96 RA  Pt tolerated exercise well and amb 470 ft independently. Pt denies CP, SOB, or dizziness throughout walk. Reviewed importance of Plavix and heart healthy diet. Will continue to follow.  [place], MS, ACSM-CEP 07/30/2022 8:23 AM

## 2022-07-30 NOTE — Progress Notes (Signed)
Progress Note  Patient Name: Richard Spence Date of Encounter: 07/30/2022  Primary Cardiologist: [name], MD   Subjective   Overnight had LHC of oLAD.  Has notable subclavian stenosis. Patient notes no chest pain No CP, SOB, Palpitations. Feels well.  Inpatient Medications    Scheduled Meds:  apixaban  5 mg Oral BID   aspirin  81 mg Oral Daily   clopidogrel  75 mg Oral Daily   metoprolol tartrate  12.5 mg Oral BID   PARoxetine  40 mg Oral Daily   pregabalin  100 mg Oral Daily   rosuvastatin  20 mg Oral Daily   sodium chloride flush  3 mL Intravenous Q12H   sodium chloride flush  3 mL Intravenous Q12H   Continuous Infusions:  sodium chloride     PRN Meds: sodium chloride, acetaminophen, nitroGLYCERIN, ondansetron (ZOFRAN) IV, sodium chloride flush   Vital Signs    Vitals:   07/29/22 1128 07/29/22 1133 07/29/22 2023 07/30/22 0431  BP:  107/66 132/70 112/70  Pulse: (!) 43 (!) 41 (!) 53 (!) 52  Resp:  '16 18 18  '$ Temp:  97.8 F (36.6 C) 98.2 F (36.8 C) 97.6 F (36.4 C)  TempSrc:  Oral Oral Oral  SpO2: 96% 96% 96% 93%  Weight:      Height:        Intake/Output Summary (Last 24 hours) at 07/30/2022 0804 Last data filed at 07/30/2022 0430 Gross per 24 hour  Intake 765.3 ml  Output 1050 ml  Net -284.7 ml   Filed Weights   07/27/22 1224 07/28/22 2202  Weight: 104.3 kg 103.1 kg    Telemetry    Marked sinus bradycardia - Personally Reviewed  ECG    (07/30/22) sinus bradycardia with inferior infarct pattern ST elevations have improved from 2/23 - Personally Reviewed  Physical Exam   Gen: no distress   Neck: No JVD Cardiac: No Rubs or Gallops, No Murmur, regulay bradycardia, +2 radial pulses  No bruit or hematoma over left radial Respiratory: Clear to auscultation bilaterally, normal effort, normal  respiratory rate GI: Soft, nontender, non-distended  MS: No  edema;  moves all extremities Integument: Skin feels warm Neuro:  At time of  evaluation, alert and oriented to person/place/time/situation  Psych: Normal affect, patient feels well   Labs    Chemistry Recent Labs  Lab 07/27/22 1227 07/30/22 0224  NA 136 138  K 3.8 4.0  CL 106 105  CO2 25 25  GLUCOSE 94 91  BUN 23 19  CREATININE 1.01 1.01  CALCIUM 8.8* 8.5*  GFRNONAA >60 >60  ANIONGAP 5 8     Hematology Recent Labs  Lab 07/27/22 1227 07/30/22 0224  WBC 7.0 9.1  RBC 5.30 4.86  HGB 16.2 15.0  HCT 47.4 44.3  MCV 89.4 91.2  MCH 30.6 30.9  MCHC 34.2 33.9  RDW 13.5 13.7  PLT 216 186    Cardiac EnzymesNo results for input(s): "TROPONINI" in the last 168 hours. No results for input(s): "TROPIPOC" in the last 168 hours.   BNPNo results for input(s): "BNP", "PROBNP" in the last 168 hours.   DDimer No results for input(s): "DDIMER" in the last 168 hours.   Radiology    CARDIAC CATHETERIZATION  Addendum Date: 07/29/2022     Proximal LAD stent widely patent.   Ost LAD lesion is 75% stenosed just before the proximal LAD stent which was significant by RFR at 0.86..   A drug-eluting stent was successfully placed using  a STENT ONYX FRONTIER 3.5X08, postdilated to greater than 4 mm in diameter and optimized with intravascular ultrasound.   Post intervention, there is a 0% residual stenosis.   The left ventricular systolic function is normal.   LV end diastolic pressure is normal.   The left ventricular ejection fraction is 55-65% by visual estimate.   There is no aortic valve stenosis.   Severe left subclavian study which was worse than the right subclavian toward velocity encountered in the past.  Would not use the left radial artery for access. Successful PCI of the ostial LAD with some stent hanging back into the left main.  Would continue Plavix indefinitely given the location of his stents.  Restart Eliquis tomorrow.  Aspirin for one month.  Continue aggressive preventive therapy.  As noted above, would not use left radial artery for access in the future.  Results conveyed to the wife, [name].    Addendum Date: 07/29/2022     Proximal LAD stent widely patent.   Ost LAD lesion is 75% stenosed just before the proximal LAD stent which was significant by RFR at 0.86..   A drug-eluting stent was successfully placed using a STENT ONYX FRONTIER 3.5X08, postdilated to greater than 4 mm in diameter and optimized with intravascular ultrasound.   Post intervention, there is a 0% residual stenosis.   The left ventricular systolic function is normal.   LV end diastolic pressure is normal.   The left ventricular ejection fraction is 55-65% by visual estimate.   There is no aortic valve stenosis.   Severe left subclavian study which was worse than the right subclavian toward velocity encountered in the past.  Would not use the left radial artery for access. Successful PCI of the ostial LAD with some stent hanging back into the left main.  Would continue Plavix indefinitely given the location of his stents.  Continue aggressive preventive therapy.  As noted above, would not use left radial artery for access in the future. Results conveyed to the wife, [name].    Result Date: 07/29/2022   Proximal LAD stent widely patent.   Ost LAD lesion is 75% stenosed just before the proximal LAD stent which was significant by RFR at 0.86..   A drug-eluting stent was successfully placed using a STENT ONYX FRONTIER 3.5X08, postdilated to greater than 4 mm in diameter and optimized with intravascular ultrasound.   Post intervention, there is a 0% residual stenosis.   The left ventricular systolic function is normal.   LV end diastolic pressure is normal.   The left ventricular ejection fraction is 55-65% by visual estimate.   There is no aortic valve stenosis.   Severe left subclavian study which was worse than the right subclavian toward velocity encountered in the past.  Would not use the left radial artery for access. Successful PCI of the ostial LAD with some stent hanging back into the left  main.  Would continue Plavix indefinitely given the location of his stents.  Continue aggressive preventive therapy.  As noted above, would not use left radial artery for access in the future. Results conveyed to the wife, [name].    ECHOCARDIOGRAM COMPLETE  Result Date: 07/28/2022    ECHOCARDIOGRAM REPORT   Patient Name:   Richard Spence Date of Exam: 07/28/2022 Medical Rec #:  [id number]        Height:       72.0 in Accession #:    [id number]       Weight:  230.0 lb Date of Birth:  21-Aug-1955        BSA:          2.261 m Patient Age:    12 years         BP:           124/66 mmHg Patient Gender: M                HR:           55 bpm. Exam Location:  Inpatient Procedure: 2D Echo, Cardiac Doppler, Color Doppler and Intracardiac            Opacification Agent Indications:    Chest Pain R07.9  History:        Patient has prior history of Echocardiogram examinations, most                 recent 02/16/2021. Cardiomyopathy, CAD, Arrythmias:Atrial                 Fibrillation and Bradycardia, Signs/Symptoms:Dyspnea; Risk                 Factors:Dyslipidemia.  Sonographer:    Ronny Flurry Referring Phys: [id number] VASUNDHRA RATHORE  Sonographer Comments: Technically difficult study due to poor echo windows, no parasternal window, suboptimal apical window and no subcostal window. IMPRESSIONS  1. Left ventricular ejection fraction, by estimation, is 60 to 65%. The left ventricle has normal function. Left ventricular endocardial border not optimally defined to evaluate regional wall motion despite the use of Definity contrast. Left ventricular  diastolic parameters are consistent with Grade I diastolic dysfunction (impaired relaxation).  2. Right ventricular systolic function is normal. The right ventricular size is normal.  3. Left atrial size was mildly dilated.  4. The aortic valve was not well visualized. Aortic valve regurgitation is not visualized. No aortic stenosis is present.  5. The mitral valve was not well  visualized. No evidence of mitral valve regurgitation. FINDINGS  Left Ventricle: Left ventricular ejection fraction, by estimation, is 60 to 65%. The left ventricle has normal function. Left ventricular endocardial border not optimally defined to evaluate regional wall motion. Definity contrast agent was given IV to delineate the left ventricular endocardial borders. The left ventricular internal cavity size was normal in size. Suboptimal image quality limits for assessment of left ventricular hypertrophy. Left ventricular diastolic parameters are consistent with Grade I diastolic dysfunction (impaired relaxation). Right Ventricle: The right ventricular size is normal. No increase in right ventricular wall thickness. Right ventricular systolic function is normal. Left Atrium: Left atrial size was mildly dilated. Right Atrium: Right atrial size was normal in size. Pericardium: The pericardium was not well visualized. Mitral Valve: The mitral valve was not well visualized. No evidence of mitral valve regurgitation. Tricuspid Valve: The tricuspid valve is grossly normal. Tricuspid valve regurgitation is not demonstrated. Aortic Valve: The aortic valve was not well visualized. Aortic valve regurgitation is not visualized. No aortic stenosis is present. Aortic valve mean gradient measures 4.0 mmHg. Aortic valve peak gradient measures 8.8 mmHg. Pulmonic Valve: The pulmonic valve was not assessed. Aorta: The aortic root was not well visualized. Venous: The inferior vena cava was not well visualized. IAS/Shunts: The interatrial septum was not assessed.   Diastology LV e' medial:    7.30 cm/s LV E/e' medial:  8.7 LV e' lateral:   10.90 cm/s LV E/e' lateral: 5.8  RIGHT VENTRICLE RV S prime:     11.30 cm/s TAPSE (M-mode): 1.5 cm LEFT ATRIUM  Index LA Vol (A4C): 57.1 ml 25.25 ml/m  AORTIC VALVE AV Vmax:           148.00 cm/s AV Vmean:          95.400 cm/s AV VTI:            0.304 m AV Peak Grad:      8.8 mmHg AV Mean  Grad:      4.0 mmHg LVOT Vmax:         128.00 cm/s LVOT Vmean:        79.700 cm/s LVOT VTI:          0.261 m LVOT/AV VTI ratio: 0.86 MITRAL VALVE MV Area (PHT): 3.37 cm    SHUNTS MV Decel Time: 225 msec    Systemic VTI: 0.26 m MV E velocity: 63.60 cm/s MV A velocity: 85.50 cm/s MV E/A ratio:  0.74 Cherlynn Kaiser MD Electronically signed by Cherlynn Kaiser MD Signature Date/Time: 07/28/2022/8:40:16 PM    Final      Patient Profile     66 y.o. male with unstable anngina  Assessment & Plan     Coronary Artery Disease; Obstructive/Nonobstructive - with unstable angina - anatomy: oLAD PCI - continue ASA 81 mg for one month and indefinite plavix (see cath note- triple therapy)  - continue statin, goal LDL < 70, as outpatient consider LDL goal < 55 - continue  Coreg 3.125-> metoprolol for BP reasons; he is tolerated his marked bradycardia without symptoms; given his CAD will continue; if symptomatic bradycardia would back off dose - continue nitrates (PRN) - discussed cardiac rehab  Paroxysmal Atrial Fibrillation,  - Risk factors include CAD, age, HTN - CHADSVASC=3. - Continue anticoagulation with eliquis; Acquired Thrombophilia - DC Flecainide at DC - outpatient AF burden assessment will decided more aggressive AAD therapy  We are arrange post cath f/u to assess: Bradycardia sx AF burden Aggressive LDL goal  Planned for DC today if TRH is in agreement   For questions or updates, please contact [facility] Please consult [email or web address] for contact info under Cardiology/STEMI.      Rudean Haskell, MD [facility] [address] [place], [place] [address] [phone]  8:04 AM

## 2022-07-31 LAB — LIPOPROTEIN A (LPA): Lipoprotein (a): 65.8 nmol/L — ABNORMAL HIGH (ref <75.0)

## 2022-07-31 NOTE — Progress Notes (Signed)
Message sent for office staff to arrange follow up with cardiology Dr [name] in 1-2 weeks.

## 2022-08-01 ENCOUNTER — Ambulatory Visit: Payer: Medicare HMO | Admitting: Podiatry

## 2022-08-01 ENCOUNTER — Telehealth: Payer: Self-pay | Admitting: Cardiology

## 2022-08-01 ENCOUNTER — Encounter: Payer: Self-pay | Admitting: Cardiology

## 2022-08-01 NOTE — Telephone Encounter (Signed)
Error

## 2022-08-01 NOTE — Telephone Encounter (Signed)
Patient's wife is calling stating Dr. [name] saw the patient in the hospital and advised him to f/u with him this week. Please advise.

## 2022-08-01 NOTE — Telephone Encounter (Signed)
Spoke with spouse [name]- per DPR. She stated that Dr. [name] wanted to see pt for Follow up within 1-2 weeks. Will consider [facility] appt if necessary. Sent to front desk to schedule.

## 2022-08-04 ENCOUNTER — Telehealth (HOSPITAL_COMMUNITY): Payer: Self-pay

## 2022-08-04 NOTE — Telephone Encounter (Signed)
Per phase I cardiac rehab, fax referral to [facility].

## 2022-08-08 ENCOUNTER — Other Ambulatory Visit: Payer: Self-pay

## 2022-08-09 ENCOUNTER — Ambulatory Visit: Payer: Medicare HMO | Attending: Cardiology | Admitting: Cardiology

## 2022-08-09 ENCOUNTER — Encounter: Payer: Self-pay | Admitting: Cardiology

## 2022-08-09 VITALS — BP 90/70 | HR 47 | Ht 72.0 in | Wt 228.0 lb

## 2022-08-09 DIAGNOSIS — Z9861 Coronary angioplasty status: Secondary | ICD-10-CM

## 2022-08-09 DIAGNOSIS — I2510 Atherosclerotic heart disease of native coronary artery without angina pectoris: Secondary | ICD-10-CM | POA: Diagnosis not present

## 2022-08-09 DIAGNOSIS — I480 Paroxysmal atrial fibrillation: Secondary | ICD-10-CM | POA: Diagnosis not present

## 2022-08-09 DIAGNOSIS — R001 Bradycardia, unspecified: Secondary | ICD-10-CM | POA: Diagnosis not present

## 2022-08-09 DIAGNOSIS — E782 Mixed hyperlipidemia: Secondary | ICD-10-CM

## 2022-08-09 DIAGNOSIS — I420 Dilated cardiomyopathy: Secondary | ICD-10-CM

## 2022-08-09 MED ORDER — ROSUVASTATIN CALCIUM 40 MG PO TABS: 40.0000 mg | ORAL_TABLET | Freq: Every day | ORAL | 3 refills | Status: DC

## 2022-08-09 NOTE — Progress Notes (Signed)
Cardiology Office Note:    Date:  08/09/2022   ID:  Richard Spence, DOB July 29, 1955, MRN [id number]  PCP:  [name]., MD  Cardiologist:  [name], MD    Referring MD: [name]., MD   Chief Complaint  Patient presents with   Hospitalization Follow-up    History of Present Illness:    Richard Spence is a 67 y.o. male    with past medical history significant for paroxysmal atrial fibrillation, he is anticoagulated, recent PTCA and stenting of the proximal LAD for ulcerated lesion.  His story is quite interesting.  Year ago he got cardiac catheterization he was identified to have about 45% stenosis in proximal LAD.  About 3 or 4 weeks before CT he presented to [facility] because of episode of chest pain he ruled out for myocardial infarction, stress test being done which was negative but after that he felt very poorly and I elected to do coronary CT angio.  Coronary CT angio showed ulcerated lesion in the proximal portion of LAD.  He was sent to the cardiac Cath Lab the ulceration of the lesion was confirmed with intraoperative coronary ultrasound sent drug-eluting stent was placed in the proximal portion of the LAD.    Recently he presented to emergency room with chest pain.  Cardiac catheterization has been performed again and again he required PTCA and stenting of the proximal LAD.  After that he seems to be doing well, he complained to be weak tired and exhausted but otherwise asymptomatic.  Past Medical History:  Diagnosis Date   Abnormal stress test 10/21/2019   Acute pain of left knee 04/14/2020   Altered mental status 10/25/2018   Atrial fibrillation with rapid ventricular response ([place]) 10/08/2018   Atrial fibrillation with rapid ventricular response ([place]) 10/08/2018   Cardiomyopathy ([place]) 10/25/2018   EF 25-30% on ST. 40% on ECHO 2020.  LVH   Chronic pain syndrome 04/27/2020   Coronary artery disease of native artery of native heart with stable angina pectoris  ([place]) 12/02/2019   Formatting of this note might be different from the original. 10/2019. Cath at [facility] LAD 45%. EF 55-60%   Degeneration of lumbar intervertebral disc 07/30/2015   DOE (dyspnea on exertion) 10/16/2018   Facet arthropathy, cervical 01/30/2020   Formatting of this note might be different from the original. Added automatically from request for surgery [id number]  Formatting of this note might be different from the original. Added automatically from request for surgery [id number]   Fatigue 06/06/2019   Hearing loss 10/15/2018   High risk medication use 07/30/2015   Long-term use of aspirin therapy 10/15/2018   Malaise and fatigue 10/25/2018   Mild cognitive impairment 12/10/2018   Formatting of this note might be different from the original. Okay on eval   Mild episode of recurrent major depressive disorder ([place]) 07/30/2015   Last Assessment & Plan:  Relevant Hx: Course: Daily Update: Today's Plan:will refill his med for him today and he is stable with this  Electronically signed by: Baldemar Friday, FNP 07/30/15 [phone]   Mixed hyperlipidemia 07/30/2015   Pain of cervical facet joint 01/30/2020   Formatting of this note might be different from the original. Added automatically from request for surgery [id number]   Paroxysmal atrial fibrillation ([place]) 05/06/2019   Permanent atrial fibrillation ([place]) 10/08/2018   Persistent atrial fibrillation, status post cardioversion in July 2020 about flip back to atrial fibrillation a few weeks later 01/21/2019   Postlaminectomy syndrome  of lumbar region 04/27/2020   Postural dizziness with presyncope 10/25/2018   Prediabetes 07/30/2015   Right shoulder injury 10/08/2018   S/P arthroscopy of right shoulder 03/12/2019   S/P rotator cuff repair 03/12/2019   SI (sacroiliac) pain 07/27/2020   Tear of right supraspinatus tendon 10/25/2018   Tremor 07/30/2015   Vitamin B 12 deficiency 07/30/2015   White matter disease, unspecified 11/19/2018   Noted on MRI of brain 2020.  Discussed with pt and wife.    Past Surgical History:  Procedure Laterality Date   BACK SURGERY     CATARACT EXTRACTION, BILATERAL Bilateral 03/08/2021   03/22/2021   CHOLECYSTECTOMY     COLONOSCOPY  05/26/2016   Dr Lyda Jester. Diminutive colon polyp (2)   CORONARY STENT INTERVENTION N/A 02/16/2021   Procedure: CORONARY STENT INTERVENTION;  Surgeon: Jettie Booze, MD;  Location: [facility];  Service: Cardiovascular;  Laterality: N/A;   CORONARY STENT INTERVENTION N/A 07/29/2022   Procedure: CORONARY STENT INTERVENTION;  Surgeon: Jettie Booze, MD;  Location: [place] [facility];  Service: Cardiovascular;  Laterality: N/A;   ESOPHAGOGASTRODUODENOSCOPY  11/04/2014   Dr Lyda Jester. Irregular GE junction questionable signicance. Erosive gastritis. Gastric polyps. Hiatal hernia. No obvious esophageal stricture status post empiric esophageal dilatation to 54 French   HERNIA REPAIR  2009   INTRAVASCULAR PRESSURE WIRE/FFR STUDY N/A 07/29/2022   Procedure: INTRAVASCULAR PRESSURE WIRE/FFR STUDY;  Surgeon: Jettie Booze, MD;  Location: [place] [facility];  Service: Cardiovascular;  Laterality: N/A;   INTRAVASCULAR ULTRASOUND/IVUS N/A 02/16/2021   Procedure: Intravascular Ultrasound/IVUS;  Surgeon: Jettie Booze, MD;  Location: [place];  Service: Cardiovascular;  Laterality: N/A;   INTRAVASCULAR ULTRASOUND/IVUS N/A 07/29/2022   Procedure: Intravascular Ultrasound/IVUS;  Surgeon: Jettie Booze, MD;  Location: [place] [facility];  Service: Cardiovascular;  Laterality: N/A;   LEFT HEART CATH AND CORONARY ANGIOGRAPHY N/A 10/25/2019   Procedure: LEFT HEART CATH AND CORONARY ANGIOGRAPHY;  Surgeon: [name], Peter M, MD;  Location: [facility];  Service: Cardiovascular;  Laterality: N/A;   LEFT HEART CATH AND CORONARY ANGIOGRAPHY N/A 02/16/2021   Procedure: LEFT HEART CATH AND CORONARY ANGIOGRAPHY;  Surgeon: Jettie Booze, MD;  Location: [facility];  Service: Cardiovascular;  Laterality: N/A;   LEFT HEART CATH AND CORONARY ANGIOGRAPHY N/A 07/29/2022   Procedure: LEFT HEART CATH AND CORONARY ANGIOGRAPHY;  Surgeon: Jettie Booze, MD;  Location: [facility];  Service: Cardiovascular;  Laterality: N/A;   ROTATOR CUFF REPAIR  03/07/2019   SHOULDER SURGERY Left 12/2010   Rotater cuff   SPINAL FUSION  1998   Dr Paauilo  2001   Spinal hardware removal by Dr. Sherryle Lis    Current Medications: Current Meds  Medication Sig   apixaban (ELIQUIS) 5 MG TABS tablet Take 1 tablet (5 mg total) by mouth 2 (two) times daily.   aspirin 81 MG chewable tablet Chew 1 tablet (81 mg total) by mouth daily.   Cholecalciferol (VITAMIN D3) 125 MCG (5000 UT) CAPS Take 5,000 Units by mouth daily.    clopidogrel (PLAVIX) 75 MG tablet TAKE ONE (1) TABLET BY MOUTH EVERY DAY   cyclobenzaprine (FLEXERIL) 10 MG tablet Take 10 mg by mouth 2 (two) times daily as needed for muscle spasms.   levocetirizine (XYZAL) 5 MG tablet Take 5 mg by mouth every evening.   metoprolol tartrate (LOPRESSOR) 25 MG tablet Take 0.5 tablets (12.5 mg total) by mouth 2 (  two) times daily.   nitroGLYCERIN (NITROSTAT) 0.4 MG SL tablet Place 1 tablet (0.4 mg total) under the tongue every 5 (five) minutes as needed. (Patient taking differently: Place 0.4 mg under the tongue every 5 (five) minutes as needed for chest pain.)   PARoxetine (PAXIL) 40 MG tablet Take 40 mg by mouth daily.    pregabalin (LYRICA) 75 MG capsule Take 2 capsules (150 mg total) by mouth daily.   rosuvastatin (CRESTOR) 20 MG tablet TAKE ONE TABLET BY MOUTH EVERY DAY   vitamin B-12 (CYANOCOBALAMIN) 1000 MCG tablet Take 1,000 mcg by mouth daily.     Allergies:   Patient has no known allergies.   Social History   Socioeconomic History   Marital status: Married    Spouse name: [name]   Number of children: 1   Years of education: Not on file   Highest education level: Not on file   Occupational History   Occupation: Retired  Tobacco Use   Smoking status: Never   Smokeless tobacco: Never  Vaping Use   Vaping Use: Never used  Substance and Sexual Activity   Alcohol use: Not Currently    Comment: since March 2023   Drug use: Not Currently   Sexual activity: Not on file  Other Topics Concern   Not on file  Social History Narrative   Not on file   Social Determinants of Health   Financial Resource Strain: Not on file  Food Insecurity: No Food Insecurity (07/27/2022)   Hunger Vital Sign    Worried About Running Out of Food in the Last Year: Never true    Ran Out of Food in the Last Year: Never true  Transportation Needs: No Transportation Needs (07/27/2022)   PRAPARE - Hydrologist (Medical): No    Lack of Transportation (Non-Medical): No  Physical Activity: Not on file  Stress: Not on file  Social Connections: Not on file     Family History: The patient's family history includes Breast cancer in his mother; Dementia in his maternal grandmother and mother; Hypertension in his father and mother; Multiple sclerosis in his son; Prostate cancer in his maternal grandfather. There is no history of Colon cancer, Rectal cancer, Stomach cancer, Liver cancer, Pancreatic cancer, or Esophageal cancer. ROS:   Please see the history of present illness.    All 14 point review of systems negative except as described per history of present illness  EKGs/Labs/Other Studies Reviewed:      Recent Labs: 07/30/2022: BUN 19; Creatinine, Ser 1.01; Hemoglobin 15.0; Platelets 186; Potassium 4.0; Sodium 138  Recent Lipid Panel    Component Value Date/Time   CHOL 132 10/19/2021 1505   TRIG 168 (H) 10/19/2021 1505   HDL 44 10/19/2021 1505   CHOLHDL 3.0 10/19/2021 1505   CHOLHDL 2.3 02/16/2021 0317   VLDL 17 02/16/2021 0317   LDLCALC 60 10/19/2021 1505    Physical Exam:    VS:  BP 90/70 (BP Location: Left Arm, Patient Position: Sitting, Cuff  Size: Normal)   Pulse (!) 47   Ht 6' (1.829 m)   Wt 228 lb (103.4 kg)   SpO2 96%   BMI 30.92 kg/m     Wt Readings from Last 3 Encounters:  08/09/22 228 lb (103.4 kg)  07/28/22 227 lb 4.7 oz (103.1 kg)  07/04/22 232 lb (105.2 kg)     GEN:  Well nourished, well developed in no acute distress HEENT: Normal NECK: No JVD; No carotid bruits LYMPHATICS:  No lymphadenopathy CARDIAC: RRR, no murmurs, no rubs, no gallops RESPIRATORY:  Clear to auscultation without rales, wheezing or rhonchi  ABDOMEN: Soft, non-tender, non-distended MUSCULOSKELETAL:  No edema; No deformity  SKIN: Warm and dry LOWER EXTREMITIES: no swelling NEUROLOGIC:  Alert and oriented x 3 PSYCHIATRIC:  Normal affect   ASSESSMENT:    1. CAD S/P percutaneous coronary angioplasty   2. Dilated cardiomyopathy ([place])   3. Sinus bradycardia   4. Paroxysmal atrial fibrillation ([facility])   5. Mixed hyperlipidemia    PLAN:    In order of problems listed above:  Coronary artery disease status post PTCA and stenting of the proximal portion of left anterior descending artery.  He is doing well from that point review.  He is on dual antiplatelet therapy plus Eliquis which we will continue for a month then aspirin will be discontinued then Eliquis and Plavix will be continued indefinitely. History of cardiomyopathy.  Stable continue present management. Sinus bradycardia with hypotension.  I will discontinue his metoprolol.  There was no coronary event this time. Paroxysmal atrial fibrillation will continue with Eliquis.  He is flecainide and diltiazem has been discontinued because of hypotension. Mixed dyslipidemia I did review K PN which show me LDL 66.  I will ask him to doubling the dose of Crestor to 40 mg daily   Medication Adjustments/Labs and Tests Ordered: Current medicines are reviewed at length with the patient today.  Concerns regarding medicines are outlined above.  No orders of the defined types were placed in this  encounter.  Medication changes: No orders of the defined types were placed in this encounter.   Signed, Park Liter, MD, [facility] 08/09/2022 9:38 AM    [place]

## 2022-08-09 NOTE — Patient Instructions (Addendum)
Medication Instructions:   STOP: Metoprolol  INCREASE: Rosuvastatin to '40mg'$  daily-You may double your current dose and your next refill will reflect your new dose.  Lab Work: Your physician recommends that you return for lab work in: 6 weeks You need to have labs done when you are fasting.  You can come Monday through Friday 8:30 am to 12:00 pm and 1:15 to 4:30. You do not need to make an appointment as the order has already been placed. The labs you are going to have done are AST, ALT, Lipids.    Testing/Procedures: None Ordered   Follow-Up: At [facility], you and your health needs are our priority.  As part of our continuing mission to provide you with exceptional heart care, we have created designated Provider Care Teams.  These Care Teams include your primary Cardiologist (physician) and Advanced Practice Providers (APPs -  Physician Assistants and Nurse Practitioners) who all work together to provide you with the care you need, when you need it.  We recommend signing up for the patient portal called "MyChart".  Sign up information is provided on this After Visit Summary.  MyChart is used to connect with patients for Virtual Visits (Telemedicine).  Patients are able to view lab/test results, encounter notes, upcoming appointments, etc.  Non-urgent messages can be sent to your provider as well.   To learn more about what you can do with MyChart, go to [email or web address].    Your next appointment:   3 month(s)  The format for your next appointment:   In Person  Provider:   [name], MD    Other Instructions NA

## 2022-08-09 NOTE — Addendum Note (Signed)
Addended by: Jacobo Forest D on: 08/09/2022 09:48 AM   Modules accepted: Orders

## 2022-08-10 NOTE — Addendum Note (Signed)
Addended by: Jerl Santos R on: 08/10/2022 08:30 AM   Modules accepted: Orders

## 2022-08-15 ENCOUNTER — Ambulatory Visit: Payer: Medicare HMO | Admitting: Podiatry

## 2022-08-16 ENCOUNTER — Other Ambulatory Visit: Payer: Self-pay | Admitting: Cardiology

## 2022-09-06 ENCOUNTER — Ambulatory Visit: Payer: Medicare HMO | Admitting: Cardiology

## 2022-09-12 ENCOUNTER — Ambulatory Visit (INDEPENDENT_AMBULATORY_CARE_PROVIDER_SITE_OTHER): Payer: Medicare HMO | Admitting: Podiatry

## 2022-09-12 DIAGNOSIS — M778 Other enthesopathies, not elsewhere classified: Secondary | ICD-10-CM

## 2022-09-12 MED ORDER — TRIAMCINOLONE ACETONIDE 40 MG/ML IJ SUSP
20.0000 mg | Freq: Once | INTRAMUSCULAR | Status: AC
  Administered 2022-09-12: 20 mg

## 2022-09-12 NOTE — Progress Notes (Signed)
He presents today for follow-up of his capsulitis forefoot.  He states that he is doing better but is not quite well.  Objective: Vital signs are stable alert oriented x 3.  Pulses are palpable.  He has pain to palpation to the third interdigital space in the endrange of motion at the second tarsal phalangeal joint.  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  Plan: Reinjected around the second metatarsophalangeal joint today immediate consider orthotics.

## 2022-09-22 LAB — LIPID PANEL
Chol/HDL Ratio: 1.9 ratio (ref 0.0–5.0)
Cholesterol, Total: 116 mg/dL (ref 100–199)
HDL: 61 mg/dL (ref >39)
LDL Chol Calc (NIH): 39 mg/dL (ref 0–99)
Triglycerides: 77 mg/dL (ref 0–149)
VLDL Cholesterol Cal: 16 mg/dL (ref 5–40)

## 2022-09-22 LAB — AST: AST: 18 IU/L (ref 0–40)

## 2022-09-22 LAB — ALT: ALT: 18 IU/L (ref 0–44)

## 2022-09-30 ENCOUNTER — Telehealth: Payer: Self-pay

## 2022-09-30 NOTE — Telephone Encounter (Signed)
Left message on My Chart with normal results per Dr. Krasowski's note. Routed to PCP. 

## 2022-10-03 ENCOUNTER — Ambulatory Visit: Payer: Medicare HMO | Admitting: Cardiology

## 2022-10-13 ENCOUNTER — Other Ambulatory Visit: Payer: Self-pay | Admitting: Cardiology

## 2022-10-25 ENCOUNTER — Ambulatory Visit (INDEPENDENT_AMBULATORY_CARE_PROVIDER_SITE_OTHER): Payer: Medicare HMO | Admitting: Podiatry

## 2022-10-25 ENCOUNTER — Encounter: Payer: Self-pay | Admitting: Podiatry

## 2022-10-25 DIAGNOSIS — M7752 Other enthesopathy of left foot: Secondary | ICD-10-CM | POA: Diagnosis not present

## 2022-10-25 MED ORDER — DEXAMETHASONE SODIUM PHOSPHATE 120 MG/30ML IJ SOLN
2.0000 mg | Freq: Once | INTRAMUSCULAR | Status: AC
  Administered 2022-10-25: 2 mg via INTRA_ARTICULAR

## 2022-10-25 NOTE — Progress Notes (Signed)
He presents today with his wife for follow-up of capsulitis second metatarsophalangeal joint of his left foot.  He states some days are better than others but really it is always back-and-forth.  Objective: Vital signs stable he is alert oriented x 3 he still has pain on palpation and range of motion of the second metatarsophalangeal joint of the left foot.  There is mild edema overlying the joint.  Significant hallux abductovalgus deformity is noted right most likely resulting in his capsulitis to the second metatarsal.  Assessment: Capsulitis second metatarsophalangeal joint left cannot rule out possible capsular tear.  Hallux abductovalgus deformity left.  Plan: Discussed etiology pathology and surgical therapies at this point I injected dexamethasone plantar aspect of the second metatarsophalangeal joint to the point of maximal tenderness.  We will consider surgical therapy onto this left foot once he is taken off of his blood thinner.  He will be on this for another year most likely after his cardiac stent.

## 2022-11-14 ENCOUNTER — Other Ambulatory Visit: Payer: Self-pay | Admitting: Cardiology

## 2022-11-14 ENCOUNTER — Ambulatory Visit: Payer: Medicare HMO | Attending: Cardiology | Admitting: Cardiology

## 2022-11-14 ENCOUNTER — Encounter: Payer: Self-pay | Admitting: Cardiology

## 2022-11-14 VITALS — BP 110/68 | HR 58 | Ht 72.0 in | Wt 229.0 lb

## 2022-11-14 DIAGNOSIS — I2510 Atherosclerotic heart disease of native coronary artery without angina pectoris: Secondary | ICD-10-CM

## 2022-11-14 DIAGNOSIS — Z9861 Coronary angioplasty status: Secondary | ICD-10-CM | POA: Diagnosis not present

## 2022-11-14 DIAGNOSIS — E782 Mixed hyperlipidemia: Secondary | ICD-10-CM

## 2022-11-14 DIAGNOSIS — I4819 Other persistent atrial fibrillation: Secondary | ICD-10-CM | POA: Diagnosis not present

## 2022-11-14 MED ORDER — RANOLAZINE ER 500 MG PO TB12: 500.0000 mg | ORAL_TABLET | Freq: Two times a day (BID) | ORAL | 3 refills | Status: DC

## 2022-11-14 NOTE — Patient Instructions (Signed)
Medication Instructions:   START: Ranolazine 500mg  1 tablet twice daily     *If you need a refill on your cardiac medications before your next appointment, please call your pharmacy*   Lab Work: BMP, CBC- today  If you have labs (blood work) drawn today and your tests are completely normal, you will receive your results only by: MyChart Message (if you have MyChart) OR A paper copy in the mail If you have any lab test that is abnormal or we need to change your treatment, we will call you to review the results.   Testing/Procedures:  [place] [facility] DEPT OF [facility] [place] [address] [place] [address] Dept: [phone] Loc: [phone]  KOLBE MIAH  11/14/2022  You are scheduled for a Cardiac Catheterization on Monday, June 17 with Dr. Tonny Bollman.  1. Please arrive at the [address] [facility] (Main Entrance A) at [facility] [address] [place], [place] [address] at 7:00 AM (This time is 2 hour(s) before your procedure to ensure your preparation). Free valet parking service is available. You will check in at ADMITTING. The support person will be asked to wait in the waiting room.  It is OK to have someone drop you off and come back when you are ready to be discharged.    Special note: Every effort is made to have your procedure done on time. Please understand that emergencies sometimes delay scheduled procedures.  2. Diet: Do not eat solid foods after midnight.  The patient may have clear liquids until 5am upon the day of the procedure.  3. Labs: You will need to have blood drawn on Monday, June 10 at [facility]: [address] [place], [place] . You do not need to be fasting.  4. Medication instructions in preparation for your procedure:   Contrast Allergy: No    Stop taking Eliquis (Apixiban) on Saturday, June 8.   On the morning of your procedure, take your Aspirin 81 mg and any morning  medicines NOT listed above.  You may use sips of water.  5. Plan to go home the same day, you will only stay overnight if medically necessary. 6. Bring a current list of your medications and current insurance cards. 7. You MUST have a responsible person to drive you home. 8. Someone MUST be with you the first 24 hours after you arrive home or your discharge will be delayed. 9. Please wear clothes that are easy to get on and off and wear slip-on shoes.  Thank you for allowing [place] to care for you!   -- [facility]    Follow-Up: At [facility], you and your health needs are our priority.  As part of our continuing mission to provide you with exceptional heart care, we have created designated Provider Care Teams.  These Care Teams include your primary Cardiologist (physician) and Advanced Practice Providers (APPs -  Physician Assistants and Nurse Practitioners) who all work together to provide you with the care you need, when you need it.  We recommend signing up for the patient portal called "MyChart".  Sign up information is provided on this After Visit Summary.  MyChart is used to connect with patients for Virtual Visits (Telemedicine).  Patients are able to view lab/test results, encounter notes, upcoming appointments, etc.  Non-urgent messages can be sent to your provider as well.   To learn more about what you can do with  MyChart, go to [email or web address].    Your next appointment:   2 month(s)  The format for your next appointment:   In Person  Provider:   [name], MD   Other Instructions  Coronary Angiogram With Stent Coronary angiogram with stent placement is a procedure to widen or open a narrow blood vessel of the heart (coronary artery). Arteries may become blocked by cholesterol buildup (plaques) in the lining of the artery wall. When a coronary artery becomes partially blocked, blood flow to that area decreases. This may lead  to chest pain or a heart attack (myocardial infarction). A stent is a small piece of metal that looks like mesh or spring. Stent placement may be done as treatment after a heart attack, or to prevent a heart attack if a blocked artery is found by a coronary angiogram. Let your health care provider know about: Any allergies you have, including allergies to medicines or contrast dye. All medicines you are taking, including vitamins, herbs, eye drops, creams, and over-the-counter medicines. Any problems you or family members have had with anesthetic medicines. Any blood disorders you have. Any surgeries you have had. Any medical conditions you have, including kidney problems or kidney failure. Whether you are pregnant or may be pregnant. Whether you are breastfeeding. What are the risks? Generally, this is a safe procedure. However, serious problems may occur, including: Damage to nearby structures or organs, such as the heart, blood vessels, or kidneys. A return of blockage. Bleeding, infection, or bruising at the insertion site. A collection of blood under the skin (hematoma) at the insertion site. A blood clot in another part of the body. Allergic reaction to medicines or dyes. Bleeding into the abdomen (retroperitoneal bleeding). Stroke (rare). Heart attack (rare). What happens before the procedure? Staying hydrated Follow instructions from your health care provider about hydration, which may include: Up to 2 hours before the procedure - you may continue to drink clear liquids, such as water, clear fruit juice, black coffee, and plain tea.    Eating and drinking restrictions Follow instructions from your health care provider about eating and drinking, which may include: 8 hours before the procedure - stop eating heavy meals or foods, such as meat, fried foods, or fatty foods. 6 hours before the procedure - stop eating light meals or foods, such as toast or cereal. 2 hours before the  procedure - stop drinking clear liquids. Medicines Ask your health care provider about: Changing or stopping your regular medicines. This is especially important if you are taking diabetes medicines or blood thinners. Taking medicines such as aspirin and ibuprofen. These medicines can thin your blood. Do not take these medicines unless your health care provider tells you to take them. Generally, aspirin is recommended before a thin tube, called a catheter, is passed through a blood vessel and inserted into the heart (cardiac catheterization). Taking over-the-counter medicines, vitamins, herbs, and supplements. General instructions Do not use any products that contain nicotine or tobacco for at least 4 weeks before the procedure. These products include cigarettes, e-cigarettes, and chewing tobacco. If you need help quitting, ask your health care provider. Plan to have someone take you home from the hospital or clinic. If you will be going home right after the procedure, plan to have someone with you for 24 hours. You may have tests and imaging procedures. Ask your health care provider: How your insertion site will be marked. Ask which artery will be used for the procedure. What steps will be  taken to help prevent infection. These may include: Removing hair at the insertion site. Washing skin with a germ-killing soap. Taking antibiotic medicine. What happens during the procedure? An IV will be inserted into one of your veins. Electrodes may be placed on your chest to monitor your heart rate during the procedure. You will be given one or more of the following: A medicine to help you relax (sedative). A medicine to numb the area (local anesthetic) for catheter insertion. A small incision will be made for catheter insertion. The catheter will be inserted into an artery using a guide wire. The location may be in your groin, your wrist, or the fold of your arm (near your elbow). An X-ray procedure  (fluoroscopy) will be used to help guide the catheter to the opening of the heart arteries. A dye will be injected into the catheter. X-rays will be taken. The dye helps to show where any narrowing or blockages are located in the arteries. Tell your health care provider if you have chest pain or trouble breathing. A tiny wire will be guided to the blocked spot, and a balloon will be inflated to make the artery wider. The stent will be expanded to crush the plaques into the wall of the vessel. The stent will hold the area open and improve the blood flow. Most stents have a drug coating to reduce the risk of the stent narrowing over time. The artery may be made wider using a drill, laser, or other tools that remove plaques. The catheter will be removed when the blood flow improves. The stent will stay where it was placed, and the lining of the artery will grow over it. A bandage (dressing) will be placed on the insertion site. Pressure will be applied to stop bleeding. The IV will be removed. This procedure may vary among health care providers and hospitals.    What happens after the procedure? Your blood pressure, heart rate, breathing rate, and blood oxygen level will be monitored until you leave the hospital or clinic. If the procedure is done through the leg, you will lie flat in bed for a few hours or for as long as told by your health care provider. You will be instructed not to bend or cross your legs. The insertion site and the pulse in your foot or wrist will be checked often. You may have more blood tests, X-rays, and a test that records the electrical activity of your heart (electrocardiogram, or ECG). Do not drive for 24 hours if you were given a sedative during your procedure. Summary Coronary angiogram with stent placement is a procedure to widen or open a narrowed coronary artery. This is done to treat heart problems. Before the procedure, let your health care provider know about all  the medical conditions and surgeries you have or have had. This is a safe procedure. However, some problems may occur, including damage to nearby structures or organs, bleeding, blood clots, or allergies. Follow your health care provider's instructions about eating, drinking, medicines, and other lifestyle changes, such as quitting tobacco use before the procedure. This information is not intended to replace advice given to you by your health care provider. Make sure you discuss any questions you have with your health care provider. Document Revised: 12/12/2018 Document Reviewed: 12/12/2018 Elsevier Patient Education  2021 [employer].

## 2022-11-14 NOTE — H&P (View-Only) (Signed)
Cardiology Office Note:    Date:  11/14/2022   ID:  Richard Spence, DOB [birth year], MRN [id number]  PCP:  [name], MD  Cardiologist:  [name], MD    Referring MD: [name], MD   Chief Complaint  Patient presents with   Chest Pain    Few days   Dizziness    History of Present Illness:    Richard Spence is a 67 y.o. male with past medical history significant for paroxysmal atrial fibrillation, he is anticoagulated, PTCA and stenting of the ulcerative lesion in proximal LAD in September 2022, then required another stenting and PTCA in July 29, 2022 of ostial LAD just before the proximal LAD stent placed 2 years before.  Additional problem include essential hypertension, dyslipidemia.  He comes today to months for follow-up and from get ago he tells me that he have again the same problem he complained of having exertional chest pain.  He said this is exactly the same thing he had before.  Maybe not as bad.  He did not take nitroglycerin for it.  He said this is exactly the same thing  Past Medical History:  Diagnosis Date   Abnormal stress test 10/21/2019   Acute pain of left knee 04/14/2020   Altered mental status 10/25/2018   Atrial fibrillation with rapid ventricular response (HCC) 10/08/2018   Atrial fibrillation with rapid ventricular response (HCC) 10/08/2018   Cardiomyopathy (HCC) 10/25/2018   EF 25-30% on ST. 40% on ECHO 2020.  LVH   Chronic pain syndrome 04/27/2020   Coronary artery disease of native artery of native heart with stable angina pectoris (HCC) 12/02/2019   Formatting of this note might be different from the original. 10/2019. Cath at [facility] LAD 45%. EF 55-60%   Degeneration of lumbar intervertebral disc 07/30/2015   DOE (dyspnea on exertion) 10/16/2018   Facet arthropathy, cervical 01/30/2020   Formatting of this note might be different from the original. Added automatically from request for surgery [id number]  Formatting of this note might be  different from the original. Added automatically from request for surgery [id number]   Fatigue 06/06/2019   Hearing loss 10/15/2018   High risk medication use 07/30/2015   Long-term use of aspirin therapy 10/15/2018   Malaise and fatigue 10/25/2018   Mild cognitive impairment 12/10/2018   Formatting of this note might be different from the original. 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 Added automatically from request for surgery [id number]   Paroxysmal atrial fibrillation (HCC) 05/06/2019   Permanent atrial fibrillation (HCC) 10/08/2018   Persistent atrial fibrillation, status post cardioversion in July 2020 about flip back to atrial fibrillation a few weeks later 01/21/2019   Postlaminectomy syndrome of lumbar region 04/27/2020   Postural dizziness with presyncope 10/25/2018   Prediabetes 07/30/2015   Right shoulder injury 10/08/2018   S/P arthroscopy of right shoulder 03/12/2019   S/P rotator cuff repair 03/12/2019   SI (sacroiliac) pain 07/27/2020   Tear of right supraspinatus tendon 10/25/2018   Tremor 07/30/2015   Vitamin B 12 deficiency 07/30/2015   White matter disease,  unspecified 11/19/2018   Noted on MRI of brain 2020. Discussed with pt and wife.    Past Surgical History:  Procedure Laterality Date   BACK SURGERY     CATARACT EXTRACTION, BILATERAL Bilateral 03/08/2021   03/22/2021   CHOLECYSTECTOMY     COLONOSCOPY  05/26/2016   Dr Jennye Boroughs. Diminutive colon polyp (2)   CORONARY PRESSURE/FFR STUDY N/A 07/29/2022   Procedure: INTRAVASCULAR PRESSURE WIRE/FFR STUDY;  Surgeon: Corky Crafts, MD;  Location:  [facility] INVASIVE CV LAB;  Service: Cardiovascular;  Laterality: N/A;   CORONARY STENT INTERVENTION N/A 02/16/2021   Procedure: CORONARY STENT INTERVENTION;  Surgeon: Corky Crafts, MD;  Location: [facility] INVASIVE CV LAB;  Service: Cardiovascular;  Laterality: N/A;   CORONARY STENT INTERVENTION N/A 07/29/2022   Procedure: CORONARY STENT INTERVENTION;  Surgeon: Corky Crafts, MD;  Location: [facility] INVASIVE CV LAB;  Service: Cardiovascular;  Laterality: N/A;   CORONARY ULTRASOUND/IVUS N/A 02/16/2021   Procedure: Intravascular Ultrasound/IVUS;  Surgeon: Corky Crafts, MD;  Location: [facility] INVASIVE CV LAB;  Service: Cardiovascular;  Laterality: N/A;   CORONARY ULTRASOUND/IVUS N/A 07/29/2022   Procedure: Intravascular Ultrasound/IVUS;  Surgeon: Corky Crafts, MD;  Location: [facility] INVASIVE CV LAB;  Service: Cardiovascular;  Laterality: N/A;   ESOPHAGOGASTRODUODENOSCOPY  11/04/2014   Dr Jennye Boroughs. Irregular GE junction questionable signicance. Erosive gastritis. Gastric polyps. Hiatal hernia. No obvious esophageal stricture status post empiric esophageal dilatation to 54 French   HERNIA REPAIR  2009   LEFT HEART CATH AND CORONARY ANGIOGRAPHY N/A 10/25/2019   Procedure: LEFT HEART CATH AND CORONARY ANGIOGRAPHY;  Surgeon: Swazil[place], Peter M, MD;  Location: [facility] INVASIVE CV LAB;  Service: Cardiovascular;  Laterality: N/A;   LEFT HEART CATH AND CORONARY ANGIOGRAPHY N/A 02/16/2021   Procedure: LEFT HEART CATH AND CORONARY ANGIOGRAPHY;  Surgeon: Corky Crafts, MD;  Location: [facility] INVASIVE CV LAB;  Service: Cardiovascular;  Laterality: N/A;   LEFT HEART CATH AND CORONARY ANGIOGRAPHY N/A 07/29/2022   Procedure: LEFT HEART CATH AND CORONARY ANGIOGRAPHY;  Surgeon: Corky Crafts, MD;  Location: [facility] INVASIVE CV LAB;  Service: Cardiovascular;  Laterality: N/A;   ROTATOR CUFF REPAIR  03/07/2019   SHOULDER SURGERY Left 12/2010   Rotater cuff   SPINAL FUSION  1998   Dr Nadene Rubins   [facility] SURGERY  2001    Spinal hardware removal by Dr. Lilian Kapur    Current Medications: Current Meds  Medication Sig   apixaban (ELIQUIS) 5 MG TABS tablet Take 1 tablet (5 mg total) by mouth 2 (two) times daily.   Cholecalciferol (VITAMIN D3) 125 MCG (5000 UT) CAPS Take 5,000 Units by mouth daily.    clopidogrel (PLAVIX) 75 MG tablet TAKE ONE (1) TABLET BY MOUTH EVERY DAY (Patient taking differently: Take 75 mg by mouth daily.)   cyclobenzaprine (FLEXERIL) 10 MG tablet Take 10 mg by mouth 2 (two) times daily as needed for muscle spasms.   levocetirizine (XYZAL) 5 MG tablet Take 5 mg by mouth every evening.   nitroGLYCERIN (NITROSTAT) 0.4 MG SL tablet Place 1 tablet (0.4 mg total) under the tongue every 5 (five) minutes as needed for chest pain.   PARoxetine (PAXIL) 40 MG tablet Take 40 mg by mouth daily.    pregabalin (LYRICA) 75 MG capsule Take 2 capsules (150 mg total) by mouth daily.   rosuvastatin (CRESTOR) 40 MG tablet Take 1 tablet (40 mg total) by mouth daily.   vitamin B-12 (CYANOCOBALAMIN) 1000 MCG tablet Take 1,000 mcg by mouth daily.     Allergies:  Patient has no known allergies.   Social History   Socioeconomic History   Marital status: Married    Spouse name: Aurea Graff   Number of children: 1   Years of education: Not on file   Highest education level: Not on file  Occupational History   Occupation: Retired  Tobacco Use   Smoking status: Never   Smokeless tobacco: Never  Vaping Use   Vaping Use: Never used  Substance and Sexual Activity   Alcohol use: Not Currently    Comment: since March 2023   Drug use: Not Currently   Sexual activity: Not on file  Other Topics Concern   Not on file  Social History Narrative   Not on file   Social Determinants of Health   Financial Resource Strain: Not on file  Food Insecurity: No Food Insecurity (07/27/2022)   Hunger Vital Sign    Worried About Running Out of Food in the Last Year: Never true    Ran Out of Food in the Last Year: Never true   Transportation Needs: No Transportation Needs (07/27/2022)   PRAPARE - Administrator, Civil Service (Medical): No    Lack of Transportation (Non-Medical): No  Physical Activity: Not on file  Stress: Not on file  Social Connections: Not on file     Family History: The patient's family history includes Breast cancer in his mother; Dementia in his maternal grandmother and mother; Hypertension in his father and mother; Multiple sclerosis in his son; Prostate cancer in his maternal grandfather. There is no history of Colon cancer, Rectal cancer, Stomach cancer, Liver cancer, Pancreatic cancer, or Esophageal cancer. ROS:   Please see the history of present illness.    All 14 point review of systems negative except as described per history of present illness  EKGs/Labs/Other Studies Reviewed:      Recent Labs: 07/30/2022: BUN 19; Creatinine, Ser 1.01; Hemoglobin 15.0; Platelets 186; Potassium 4.0; Sodium 138 09/21/2022: ALT 18  Recent Lipid Panel    Component Value Date/Time   CHOL 116 09/21/2022 0810   TRIG 77 09/21/2022 0810   HDL 61 09/21/2022 0810   CHOLHDL 1.9 09/21/2022 0810   CHOLHDL 2.3 02/16/2021 0317   VLDL 17 02/16/2021 0317   LDLCALC 39 09/21/2022 0810    Physical Exam:    VS:  BP 110/68 (BP Location: Left Arm, Patient Position: Sitting)   Pulse (!) 58   Ht 6' (1.829 m)   Wt 229 lb (103.9 kg)   SpO2 95%   BMI 31.06 kg/m     Wt Readings from Last 3 Encounters:  11/14/22 229 lb (103.9 kg)  08/09/22 228 lb (103.4 kg)  07/28/22 227 lb 4.7 oz (103.1 kg)     GEN:  Well nourished, well developed in no acute distress HEENT: Normal NECK: No JVD; No carotid bruits LYMPHATICS: No lymphadenopathy CARDIAC: RRR, no murmurs, no rubs, no gallops RESPIRATORY:  Clear to auscultation without rales, wheezing or rhonchi  ABDOMEN: Soft, non-tender, non-distended MUSCULOSKELETAL:  No edema; No deformity  SKIN: Warm and dry LOWER EXTREMITIES: no  swelling NEUROLOGIC:  Alert and oriented x 3 PSYCHIATRIC:  Normal affect   ASSESSMENT:    1. CAD S/P percutaneous coronary angioplasty   2. Persistent atrial fibrillation, status post cardioversion in July 2020 about flip back to atrial fibrillation a few weeks later   3. Mixed hyperlipidemia    PLAN:    In order of problems listed above:  Coronary disease status post 2 intervention  in LAD.  He presented with typical symptoms again he is somewhat poor historian but I think he knows enough to recognize this is similar thing.  We talk about options option being medical therapy versus cardiac catheterization.  Both him and his wife prefer to have cardiac catheterization.  I think is quite reasonable especially considering the fact that he did have lesion in proximal LAD if he occluded this area that will be catastrophic.  On top of that they planning to visit his family in [place] and they think the care over there is substandard and do not want to have any problem over there.  Will continue antiplatelet therapy.  He was told if he develops chest pain that is not relieved by nitroglycerin he needs to go to the emergency room or call 911.  Cardiac catheterization has been explained to him 1 more time including always benefits as well as potential complications.  They are very familiar with the procedure he already got 3 cardiac catheterization Dyslipidemia I did review his K PN which show me his LDL 39 HDL 61 he is on high intense statin in form of Crestor 40 which I will continue. Paroxysmal atrial fibrillation seems maintaining sinus rhythm.  He is on Eliquis 5 mg twice daily.   Medication Adjustments/Labs and Tests Ordered: Current medicines are reviewed at length with the patient today.  Concerns regarding medicines are outlined above.  No orders of the defined types were placed in this encounter.  Medication changes: No orders of the defined types were placed in this  encounter.   Signed, Georgeanna Lea, MD, [facility] 11/14/2022 11:57 AM    [facility]

## 2022-11-14 NOTE — Progress Notes (Unsigned)
Cardiology Office Note:    Date:  11/14/2022   ID:  Richard Spence, DOB 03/22/1956, MRN [id number]  PCP:  Grisso, Greg A., MD  Cardiologist:  Ronell Boldin, MD    Referring MD: Grisso, Greg A., MD   Chief Complaint  Patient presents with   Chest Pain    Few days   Dizziness    History of Present Illness:    Richard Spence is a 67 y.o. male with past medical history significant for paroxysmal atrial fibrillation, he is anticoagulated, PTCA and stenting of the ulcerative lesion in proximal LAD in September 2022, then required another stenting and PTCA in July 29, 2022 of ostial LAD just before the proximal LAD stent placed 2 years before.  Additional problem include essential hypertension, dyslipidemia.  He comes today to months for follow-up and from get ago he tells me that he have again the same problem he complained of having exertional chest pain.  He said this is exactly the same thing he had before.  Maybe not as bad.  He did not take nitroglycerin for it.  He said this is exactly the same thing  Past Medical History:  Diagnosis Date   Abnormal stress test 10/21/2019   Acute pain of left knee 04/14/2020   Altered mental status 10/25/2018   Atrial fibrillation with rapid ventricular response (HCC) 10/08/2018   Atrial fibrillation with rapid ventricular response (HCC) 10/08/2018   Cardiomyopathy (HCC) 10/25/2018   EF 25-30% on ST. 40% on ECHO 2020.  LVH   Chronic pain syndrome 04/27/2020   Coronary artery disease of native artery of native heart with stable angina pectoris (HCC) 12/02/2019   Formatting of this note might be different from the original. 10/2019. Cath at [facility] LAD 45%. EF 55-60%   Degeneration of lumbar intervertebral disc 07/30/2015   DOE (dyspnea on exertion) 10/16/2018   Facet arthropathy, cervical 01/30/2020   Formatting of this note might be different from the original. Added automatically from request for surgery [id number]  Formatting of this note might be  different from the original. Added automatically from request for surgery [id number]   Fatigue 06/06/2019   Hearing loss 10/15/2018   High risk medication use 07/30/2015   Long-term use of aspirin therapy 10/15/2018   Malaise and fatigue 10/25/2018   Mild cognitive impairment 12/10/2018   Formatting of this note might be different from the original. 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 Added automatically from request for surgery [id number]   Paroxysmal atrial fibrillation (HCC) 05/06/2019   Permanent atrial fibrillation (HCC) 10/08/2018   Persistent atrial fibrillation, status post cardioversion in July 2020 about flip back to atrial fibrillation a few weeks later 01/21/2019   Postlaminectomy syndrome of lumbar region 04/27/2020   Postural dizziness with presyncope 10/25/2018   Prediabetes 07/30/2015   Right shoulder injury 10/08/2018   S/P arthroscopy of right shoulder 03/12/2019   S/P rotator cuff repair 03/12/2019   SI (sacroiliac) pain 07/27/2020   Tear of right supraspinatus tendon 10/25/2018   Tremor 07/30/2015   Vitamin B 12 deficiency 07/30/2015   White matter disease,   unspecified 11/19/2018   Noted on MRI of brain 2020. Discussed with pt and wife.    Past Surgical History:  Procedure Laterality Date   BACK SURGERY     CATARACT EXTRACTION, BILATERAL Bilateral 03/08/2021   03/22/2021   CHOLECYSTECTOMY     COLONOSCOPY  05/26/2016   Dr Misenheimer. Diminutive colon polyp (2)   CORONARY PRESSURE/FFR STUDY N/A 07/29/2022   Procedure: INTRAVASCULAR PRESSURE WIRE/FFR STUDY;  Surgeon: Varanasi, Jayadeep S, MD;  Location:  [facility] INVASIVE CV LAB;  Service: Cardiovascular;  Laterality: N/A;   CORONARY STENT INTERVENTION N/A 02/16/2021   Procedure: CORONARY STENT INTERVENTION;  Surgeon: Varanasi, Jayadeep S, MD;  Location: [facility] INVASIVE CV LAB;  Service: Cardiovascular;  Laterality: N/A;   CORONARY STENT INTERVENTION N/A 07/29/2022   Procedure: CORONARY STENT INTERVENTION;  Surgeon: Varanasi, Jayadeep S, MD;  Location: [facility] INVASIVE CV LAB;  Service: Cardiovascular;  Laterality: N/A;   CORONARY ULTRASOUND/IVUS N/A 02/16/2021   Procedure: Intravascular Ultrasound/IVUS;  Surgeon: Varanasi, Jayadeep S, MD;  Location: [facility] INVASIVE CV LAB;  Service: Cardiovascular;  Laterality: N/A;   CORONARY ULTRASOUND/IVUS N/A 07/29/2022   Procedure: Intravascular Ultrasound/IVUS;  Surgeon: Varanasi, Jayadeep S, MD;  Location: [facility] INVASIVE CV LAB;  Service: Cardiovascular;  Laterality: N/A;   ESOPHAGOGASTRODUODENOSCOPY  11/04/2014   Dr Misenheimer. Irregular GE junction questionable signicance. Erosive gastritis. Gastric polyps. Hiatal hernia. No obvious esophageal stricture status post empiric esophageal dilatation to 54 French   HERNIA REPAIR  2009   LEFT HEART CATH AND CORONARY ANGIOGRAPHY N/A 10/25/2019   Procedure: LEFT HEART CATH AND CORONARY ANGIOGRAPHY;  Surgeon: Jordan, Peter M, MD;  Location: [facility] INVASIVE CV LAB;  Service: Cardiovascular;  Laterality: N/A;   LEFT HEART CATH AND CORONARY ANGIOGRAPHY N/A 02/16/2021   Procedure: LEFT HEART CATH AND CORONARY ANGIOGRAPHY;  Surgeon: Varanasi, Jayadeep S, MD;  Location: [facility] INVASIVE CV LAB;  Service: Cardiovascular;  Laterality: N/A;   LEFT HEART CATH AND CORONARY ANGIOGRAPHY N/A 07/29/2022   Procedure: LEFT HEART CATH AND CORONARY ANGIOGRAPHY;  Surgeon: Varanasi, Jayadeep S, MD;  Location: [facility] INVASIVE CV LAB;  Service: Cardiovascular;  Laterality: N/A;   ROTATOR CUFF REPAIR  03/07/2019   SHOULDER SURGERY Left 12/2010   Rotater cuff   SPINAL FUSION  1998   Dr Neave   SPINE SURGERY  2001    Spinal hardware removal by Dr. McDonald    Current Medications: Current Meds  Medication Sig   apixaban (ELIQUIS) 5 MG TABS tablet Take 1 tablet (5 mg total) by mouth 2 (two) times daily.   Cholecalciferol (VITAMIN D3) 125 MCG (5000 UT) CAPS Take 5,000 Units by mouth daily.    clopidogrel (PLAVIX) 75 MG tablet TAKE ONE (1) TABLET BY MOUTH EVERY DAY (Patient taking differently: Take 75 mg by mouth daily.)   cyclobenzaprine (FLEXERIL) 10 MG tablet Take 10 mg by mouth 2 (two) times daily as needed for muscle spasms.   levocetirizine (XYZAL) 5 MG tablet Take 5 mg by mouth every evening.   nitroGLYCERIN (NITROSTAT) 0.4 MG SL tablet Place 1 tablet (0.4 mg total) under the tongue every 5 (five) minutes as needed for chest pain.   PARoxetine (PAXIL) 40 MG tablet Take 40 mg by mouth daily.    pregabalin (LYRICA) 75 MG capsule Take 2 capsules (150 mg total) by mouth daily.   rosuvastatin (CRESTOR) 40 MG tablet Take 1 tablet (40 mg total) by mouth daily.   vitamin B-12 (CYANOCOBALAMIN) 1000 MCG tablet Take 1,000 mcg by mouth daily.     Allergies:     Patient has no known allergies.   Social History   Socioeconomic History   Marital status: Married    Spouse name: Joan   Number of children: 1   Years of education: Not on file   Highest education level: Not on file  Occupational History   Occupation: Retired  Tobacco Use   Smoking status: Never   Smokeless tobacco: Never  Vaping Use   Vaping Use: Never used  Substance and Sexual Activity   Alcohol use: Not Currently    Comment: since March 2023   Drug use: Not Currently   Sexual activity: Not on file  Other Topics Concern   Not on file  Social History Narrative   Not on file   Social Determinants of Health   Financial Resource Strain: Not on file  Food Insecurity: No Food Insecurity (07/27/2022)   Hunger Vital Sign    Worried About Running Out of Food in the Last Year: Never true    Ran Out of Food in the Last Year: Never true   Transportation Needs: No Transportation Needs (07/27/2022)   PRAPARE - Transportation    Lack of Transportation (Medical): No    Lack of Transportation (Non-Medical): No  Physical Activity: Not on file  Stress: Not on file  Social Connections: Not on file     Family History: The patient's family history includes Breast cancer in his mother; Dementia in his maternal grandmother and mother; Hypertension in his father and mother; Multiple sclerosis in his son; Prostate cancer in his maternal grandfather. There is no history of Colon cancer, Rectal cancer, Stomach cancer, Liver cancer, Pancreatic cancer, or Esophageal cancer. ROS:   Please see the history of present illness.    All 14 point review of systems negative except as described per history of present illness  EKGs/Labs/Other Studies Reviewed:      Recent Labs: 07/30/2022: BUN 19; Creatinine, Ser 1.01; Hemoglobin 15.0; Platelets 186; Potassium 4.0; Sodium 138 09/21/2022: ALT 18  Recent Lipid Panel    Component Value Date/Time   CHOL 116 09/21/2022 0810   TRIG 77 09/21/2022 0810   HDL 61 09/21/2022 0810   CHOLHDL 1.9 09/21/2022 0810   CHOLHDL 2.3 02/16/2021 0317   VLDL 17 02/16/2021 0317   LDLCALC 39 09/21/2022 0810    Physical Exam:    VS:  BP 110/68 (BP Location: Left Arm, Patient Position: Sitting)   Pulse (!) 58   Ht 6' (1.829 m)   Wt 229 lb (103.9 kg)   SpO2 95%   BMI 31.06 kg/m     Wt Readings from Last 3 Encounters:  11/14/22 229 lb (103.9 kg)  08/09/22 228 lb (103.4 kg)  07/28/22 227 lb 4.7 oz (103.1 kg)     GEN:  Well nourished, well developed in no acute distress HEENT: Normal NECK: No JVD; No carotid bruits LYMPHATICS: No lymphadenopathy CARDIAC: RRR, no murmurs, no rubs, no gallops RESPIRATORY:  Clear to auscultation without rales, wheezing or rhonchi  ABDOMEN: Soft, non-tender, non-distended MUSCULOSKELETAL:  No edema; No deformity  SKIN: Warm and dry LOWER EXTREMITIES: no  swelling NEUROLOGIC:  Alert and oriented x 3 PSYCHIATRIC:  Normal affect   ASSESSMENT:    1. CAD S/P percutaneous coronary angioplasty   2. Persistent atrial fibrillation, status post cardioversion in July 2020 about flip back to atrial fibrillation a few weeks later   3. Mixed hyperlipidemia    PLAN:    In order of problems listed above:  Coronary disease status post 2 intervention   in LAD.  He presented with typical symptoms again he is somewhat poor historian but I think he knows enough to recognize this is similar thing.  We talk about options option being medical therapy versus cardiac catheterization.  Both him and his wife prefer to have cardiac catheterization.  I think is quite reasonable especially considering the fact that he did have lesion in proximal LAD if he occluded this area that will be catastrophic.  On top of that they planning to visit his family in [place] and they think the care over there is substandard and do not want to have any problem over there.  Will continue antiplatelet therapy.  He was told if he develops chest pain that is not relieved by nitroglycerin he needs to go to the emergency room or call 911.  Cardiac catheterization has been explained to him 1 more time including always benefits as well as potential complications.  They are very familiar with the procedure he already got 3 cardiac catheterization Dyslipidemia I did review his K PN which show me his LDL 39 HDL 61 he is on high intense statin in form of Crestor 40 which I will continue. Paroxysmal atrial fibrillation seems maintaining sinus rhythm.  He is on Eliquis 5 mg twice daily.   Medication Adjustments/Labs and Tests Ordered: Current medicines are reviewed at length with the patient today.  Concerns regarding medicines are outlined above.  No orders of the defined types were placed in this encounter.  Medication changes: No orders of the defined types were placed in this  encounter.   Signed, Jennilyn Esteve J. Emmaline Wahba, MD, FACC 11/14/2022 11:57 AM    [facility] 

## 2022-11-15 LAB — BASIC METABOLIC PANEL
BUN/Creatinine Ratio: 21 (ref 10–24)
BUN: 19 mg/dL (ref 8–27)
CO2: 24 mmol/L (ref 20–29)
Calcium: 9.5 mg/dL (ref 8.6–10.2)
Chloride: 105 mmol/L (ref 96–106)
Creatinine, Ser: 0.91 mg/dL (ref 0.76–1.27)
Glucose: 77 mg/dL (ref 70–99)
Potassium: 4.4 mmol/L (ref 3.5–5.2)
Sodium: 142 mmol/L (ref 134–144)
eGFR: 92 mL/min/{1.73_m2} (ref >59)

## 2022-11-15 LAB — CBC
Hematocrit: 49.1 % (ref 37.5–51.0)
Hemoglobin: 16 g/dL (ref 13.0–17.7)
MCH: 30.1 pg (ref 26.6–33.0)
MCHC: 32.6 g/dL (ref 31.5–35.7)
MCV: 92 fL (ref 79–97)
Platelets: 227 10*3/uL (ref 150–450)
RBC: 5.32 x10E6/uL (ref 4.14–5.80)
RDW: 13.1 % (ref 11.6–15.4)
WBC: 7 10*3/uL (ref 3.4–10.8)

## 2022-11-17 ENCOUNTER — Telehealth: Payer: Self-pay | Admitting: *Deleted

## 2022-11-17 NOTE — Telephone Encounter (Signed)
Cardiac Catheterization scheduled at [facility] for: Monday November 21, 2022 9 AM Arrival time [facility] Main Entrance A at: 7 AM  Nothing to eat after midnight prior to procedure, clear liquids until 5 AM day of procedure.  Medication instructions: -Do not take:  Eliquis-none starting 11/19/22 until post procedure -Other usual morning medications can be taken with sips of water including aspirin 81 mg and Plavix 75 mg.  Confirmed patient has responsible adult to drive home post procedure and be with patient first 24 hours after arriving home.  Plan to go home the same day, you will only stay overnight if medically necessary.  Reviewed procedure instructions with patient's wife, Aurea Graff [facility]).

## 2022-11-21 ENCOUNTER — Ambulatory Visit (HOSPITAL_COMMUNITY)
Admission: RE | Admit: 2022-11-21 | Discharge: 2022-11-21 | Disposition: A | Discharge status: Home / Self Care | Payer: Medicare HMO | Source: Ambulatory Visit | Attending: Cardiovascular Disease | Admitting: Cardiovascular Disease

## 2022-11-21 ENCOUNTER — Encounter (HOSPITAL_COMMUNITY)
Admission: RE | Disposition: A | Discharge status: Home / Self Care | Payer: Self-pay | Source: Ambulatory Visit | Attending: Cardiovascular Disease

## 2022-11-21 DIAGNOSIS — R42 Dizziness and giddiness: Secondary | ICD-10-CM | POA: Insufficient documentation

## 2022-11-21 DIAGNOSIS — I4819 Other persistent atrial fibrillation: Secondary | ICD-10-CM | POA: Diagnosis not present

## 2022-11-21 DIAGNOSIS — Z79899 Other long term (current) drug therapy: Secondary | ICD-10-CM | POA: Diagnosis not present

## 2022-11-21 DIAGNOSIS — Z7901 Long term (current) use of anticoagulants: Secondary | ICD-10-CM | POA: Diagnosis not present

## 2022-11-21 DIAGNOSIS — I25119 Atherosclerotic heart disease of native coronary artery with unspecified angina pectoris: Secondary | ICD-10-CM | POA: Diagnosis not present

## 2022-11-21 DIAGNOSIS — I10 Essential (primary) hypertension: Secondary | ICD-10-CM | POA: Insufficient documentation

## 2022-11-21 DIAGNOSIS — E782 Mixed hyperlipidemia: Secondary | ICD-10-CM | POA: Diagnosis not present

## 2022-11-21 DIAGNOSIS — Z955 Presence of coronary angioplasty implant and graft: Secondary | ICD-10-CM | POA: Insufficient documentation

## 2022-11-21 HISTORY — PX: LEFT HEART CATH AND CORONARY ANGIOGRAPHY: CATH118249

## 2022-11-21 SURGERY — LEFT HEART CATH AND CORONARY ANGIOGRAPHY
Anesthesia: LOCAL

## 2022-11-21 MED ORDER — IOHEXOL 350 MG/ML SOLN
INTRAVENOUS | Status: DC | PRN
  Administered 2022-11-21: 55 mL

## 2022-11-21 MED ORDER — HYDRALAZINE HCL 20 MG/ML IJ SOLN: 10.0000 mg | INTRAMUSCULAR | Status: DC | PRN

## 2022-11-21 MED ORDER — SODIUM CHLORIDE 0.9% FLUSH: 3.0000 mL | Freq: Two times a day (BID) | INTRAVENOUS | Status: DC

## 2022-11-21 MED ORDER — LIDOCAINE HCL (PF) 1 % IJ SOLN
INTRAMUSCULAR | Status: AC
  Filled 2022-11-21: qty 30

## 2022-11-21 MED ORDER — SODIUM CHLORIDE 0.9 % IV SOLN: 250.0000 mL | INTRAVENOUS | Status: DC | PRN

## 2022-11-21 MED ORDER — SODIUM CHLORIDE 0.9 % WEIGHT BASED INFUSION
3.0000 mL/kg/h | INTRAVENOUS | Status: AC
  Administered 2022-11-21: 3 mL/kg/h via INTRAVENOUS

## 2022-11-21 MED ORDER — SODIUM CHLORIDE 0.9 % WEIGHT BASED INFUSION: 1.0000 mL/kg/h | INTRAVENOUS | Status: DC

## 2022-11-21 MED ORDER — ASPIRIN 81 MG PO CHEW: 81.0000 mg | CHEWABLE_TABLET | ORAL | Status: DC

## 2022-11-21 MED ORDER — LIDOCAINE HCL (PF) 1 % IJ SOLN
INTRAMUSCULAR | Status: DC | PRN
  Administered 2022-11-21: 15 mL

## 2022-11-21 MED ORDER — ONDANSETRON HCL 4 MG/2ML IJ SOLN: 4.0000 mg | Freq: Four times a day (QID) | INTRAMUSCULAR | Status: DC | PRN

## 2022-11-21 MED ORDER — FENTANYL CITRATE (PF) 100 MCG/2ML IJ SOLN
INTRAMUSCULAR | Status: DC | PRN
  Administered 2022-11-21: 25 ug via INTRAVENOUS

## 2022-11-21 MED ORDER — MIDAZOLAM HCL 2 MG/2ML IJ SOLN
INTRAMUSCULAR | Status: DC | PRN
  Administered 2022-11-21: 2 mg via INTRAVENOUS

## 2022-11-21 MED ORDER — SODIUM CHLORIDE 0.9% FLUSH: 3.0000 mL | INTRAVENOUS | Status: DC | PRN

## 2022-11-21 MED ORDER — LABETALOL HCL 5 MG/ML IV SOLN: 10.0000 mg | INTRAVENOUS | Status: DC | PRN

## 2022-11-21 MED ORDER — ACETAMINOPHEN 325 MG PO TABS: 650.0000 mg | ORAL_TABLET | ORAL | Status: DC | PRN

## 2022-11-21 MED ORDER — FENTANYL CITRATE (PF) 100 MCG/2ML IJ SOLN
INTRAMUSCULAR | Status: AC
  Filled 2022-11-21: qty 2

## 2022-11-21 MED ORDER — CLOPIDOGREL BISULFATE 75 MG PO TABS: 75.0000 mg | ORAL_TABLET | ORAL | Status: DC

## 2022-11-21 MED ORDER — MIDAZOLAM HCL 2 MG/2ML IJ SOLN
INTRAMUSCULAR | Status: AC
  Filled 2022-11-21: qty 2

## 2022-11-21 MED ORDER — HEPARIN (PORCINE) IN NACL 1000-0.9 UT/500ML-% IV SOLN
INTRAVENOUS | Status: DC | PRN
  Administered 2022-11-21 (×2): 500 mL

## 2022-11-21 SURGICAL SUPPLY — 10 items
CATH INFINITI 5FR MULTPACK ANG (CATHETERS) IMPLANT
CLOSURE MYNX CONTROL 5F (Vascular Products) IMPLANT
KIT HEART LEFT (KITS) ×1 IMPLANT
KIT MICROPUNCTURE NIT STIFF (SHEATH) IMPLANT
PACK CARDIAC CATHETERIZATION (CUSTOM PROCEDURE TRAY) ×1 IMPLANT
SHEATH PINNACLE 5F 10CM (SHEATH) IMPLANT
SHEATH PROBE COVER 6X72 (BAG) IMPLANT
TRANSDUCER W/STOPCOCK (MISCELLANEOUS) ×1 IMPLANT
TUBING CIL FLEX 10 FLL-RA (TUBING) ×1 IMPLANT
WIRE EMERALD 3MM-J .035X150CM (WIRE) IMPLANT

## 2022-11-21 NOTE — Interval H&P Note (Signed)
History and Physical Interval Note:  11/21/2022 10:20 AM  Richard Spence  has presented today for surgery, with the diagnosis of chest pain.  The various methods of treatment have been discussed with the patient and family. After consideration of risks, benefits and other options for treatment, the patient has consented to  Procedure(s): LEFT HEART CATH AND CORONARY ANGIOGRAPHY (N/A) as a surgical intervention.  The patient's history has been reviewed, patient examined, no change in status, stable for surgery.  I have reviewed the patient's chart and labs.  Questions were answered to the patient's satisfaction.     Tonny Bollman

## 2022-11-22 ENCOUNTER — Encounter (HOSPITAL_COMMUNITY): Payer: Self-pay | Admitting: Cardiovascular Disease

## 2023-01-17 ENCOUNTER — Ambulatory Visit: Payer: Medicare HMO | Admitting: Podiatry

## 2023-01-23 ENCOUNTER — Ambulatory Visit: Payer: Medicare HMO | Attending: Cardiology | Admitting: Cardiology

## 2023-01-23 ENCOUNTER — Encounter: Payer: Self-pay | Admitting: Cardiology

## 2023-01-23 VITALS — BP 118/60 | HR 76 | Ht 72.0 in | Wt 228.0 lb

## 2023-01-23 DIAGNOSIS — I2510 Atherosclerotic heart disease of native coronary artery without angina pectoris: Secondary | ICD-10-CM

## 2023-01-23 DIAGNOSIS — R001 Bradycardia, unspecified: Secondary | ICD-10-CM

## 2023-01-23 DIAGNOSIS — I255 Ischemic cardiomyopathy: Secondary | ICD-10-CM | POA: Diagnosis not present

## 2023-01-23 DIAGNOSIS — Z9861 Coronary angioplasty status: Secondary | ICD-10-CM

## 2023-01-23 DIAGNOSIS — I25118 Atherosclerotic heart disease of native coronary artery with other forms of angina pectoris: Secondary | ICD-10-CM

## 2023-01-23 DIAGNOSIS — I480 Paroxysmal atrial fibrillation: Secondary | ICD-10-CM | POA: Diagnosis not present

## 2023-01-23 NOTE — Progress Notes (Signed)
Cardiology Office Note:    Date:  01/23/2023   ID:  Richard Spence, DOB July 22, 1955, MRN [id number]  PCP:  Richard Payment., MD  Cardiologist:  Richard Balsam, MD    Referring MD: Richard Payment., MD   Chief Complaint  Patient presents with   Follow-up    History of Present Illness:    Richard Spence is a 67 y.o. male with past medical history significant for paroxysmal atrial fibrillation, he is anticoagulated, PTCA and stenting of the ulcerated lesion in the proximal ID in September 2022 done in February 24 he required another PTCA and stenting of ostial LAD just before the proximal stent placed before.  Additionally she include essential hypertension dyslipidemia. Comes today to my office for follow-up overall doing very well.  Denies have any chest pain tightness squeezing pressure burning chest, he went to [place] he enjoyed his trip to his family.  Past Medical History:  Diagnosis Date   Abnormal cardiac CT angiography    Abnormal stress test 10/21/2019   Acute pain of left knee 04/14/2020   Altered mental status 10/25/2018   Atrial fibrillation with rapid ventricular response (HCC) 10/08/2018   Atrial fibrillation with rapid ventricular response (HCC) 10/08/2018   CAD S/P percutaneous coronary angioplasty 12/02/2019   Formatting of this note might be different from the original.  10/2019. Cath at [facility] LAD 45%. EF 55-60%     Formatting of this note might be different from the original.  10/2019. Cath at [facility] LAD 45%. EF 55-60%.     Richard Spence.     Cardiomyopathy (HCC) 10/25/2018   EF 25-30% on ST. 40% on ECHO 2020.  LVH   Chest pain 02/15/2021   Chronic pain 04/27/2020   Chronic pain syndrome 04/27/2020   Coronary artery disease of native artery of native heart with stable angina pectoris (HCC) 12/02/2019   Formatting of this note might be different from the original. 10/2019. Cath at [facility] LAD 45%. EF 55-60%   De Quervain's tenosynovitis 03/02/2022   Degeneration of  lumbar intervertebral disc 07/30/2015   DOE (dyspnea on exertion) 10/16/2018   Epidermal inclusion cyst 10/12/2021   Erectile dysfunction 03/02/2022   Facet arthropathy, cervical 01/30/2020   Formatting of this note might be different from the original. Added automatically from request for surgery [id number]  Formatting of this note might be different from the original. Added automatically from request for surgery [id number]   Facial mass 12/15/2021   Formatting of this note might be different from the original. Cutaneous benign mixed tumor     Fatigue 06/06/2019   Hearing loss 10/15/2018   High risk medication use 07/30/2015   HLD (hyperlipidemia) 07/30/2015   Long-term use of aspirin therapy 10/15/2018   Malaise and fatigue 10/25/2018   Mild cognitive impairment 12/10/2018   Formatting of this note might be different from the original. 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  Added automatically from request for surgery [id number]     Pain of cervical facet joint 01/30/2020   Formatting of this note might be different from the  original. Added automatically from request for surgery [id number]   Paroxysmal atrial fibrillation (HCC) 05/06/2019   Persistent atrial fibrillation, status post cardioversion in July 2020 about flip back to atrial fibrillation a few weeks later 01/21/2019   Postlaminectomy syndrome of lumbar region 04/27/2020   Postural dizziness with presyncope 10/25/2018   Prediabetes 07/30/2015   Right shoulder injury 10/08/2018   S/P arthroscopy of right shoulder 03/12/2019   S/P rotator  cuff repair 03/12/2019   SI (sacroiliac) pain 07/27/2020   Sinus bradycardia 07/28/2022   Tear of right supraspinatus tendon 10/25/2018   Tremor 07/30/2015   Unstable angina (HCC) 07/28/2022   Vitamin B 12 deficiency 07/30/2015   White matter disease, unspecified 11/19/2018   Noted on MRI of brain 2020. Discussed with pt and wife.    Past Surgical History:  Procedure Laterality Date   BACK SURGERY     CATARACT EXTRACTION, BILATERAL Bilateral 03/08/2021   03/22/2021   CHOLECYSTECTOMY     COLONOSCOPY  05/26/2016   Dr Richard Spence. Diminutive colon polyp (2)   CORONARY PRESSURE/FFR STUDY N/A 07/29/2022   Procedure: INTRAVASCULAR PRESSURE WIRE/FFR STUDY;  Surgeon: Richard Crafts, MD;  Location: [facility] INVASIVE CV LAB;  Service: Cardiovascular;  Laterality: N/A;   CORONARY STENT INTERVENTION N/A 02/16/2021   Procedure: CORONARY STENT INTERVENTION;  Surgeon: Richard Crafts, MD;  Location: [facility] INVASIVE CV LAB;  Service: Cardiovascular;  Laterality: N/A;   CORONARY STENT INTERVENTION N/A 07/29/2022   Procedure: CORONARY STENT INTERVENTION;  Surgeon: Richard Crafts, MD;  Location: [facility] INVASIVE CV LAB;  Service: Cardiovascular;  Laterality: N/A;   CORONARY ULTRASOUND/IVUS N/A 02/16/2021   Procedure: Intravascular Ultrasound/IVUS;  Surgeon: Richard Crafts, MD;  Location: [facility] INVASIVE CV LAB;  Service: Cardiovascular;  Laterality: N/A;   CORONARY ULTRASOUND/IVUS N/A 07/29/2022   Procedure: Intravascular Ultrasound/IVUS;  Surgeon: Richard Crafts, MD;  Location: [facility] INVASIVE CV LAB;  Service: Cardiovascular;  Laterality: N/A;   ESOPHAGOGASTRODUODENOSCOPY  11/04/2014   Dr Richard Spence. Irregular GE junction questionable signicance. Erosive gastritis. Gastric polyps. Hiatal hernia. No obvious esophageal stricture status post empiric esophageal dilatation to 54 French   HERNIA REPAIR  2009   LEFT HEART CATH AND CORONARY ANGIOGRAPHY N/A 10/25/2019   Procedure: LEFT HEART CATH AND  CORONARY ANGIOGRAPHY;  Surgeon: Spence, Richard M, MD;  Location: [facility] INVASIVE CV LAB;  Service: Cardiovascular;  Laterality: N/A;   LEFT HEART CATH AND CORONARY ANGIOGRAPHY N/A 02/16/2021   Procedure: LEFT HEART CATH AND CORONARY ANGIOGRAPHY;  Surgeon: Richard Crafts, MD;  Location: [facility] INVASIVE CV LAB;  Service: Cardiovascular;  Laterality: N/A;   LEFT HEART CATH AND CORONARY ANGIOGRAPHY N/A 07/29/2022   Procedure: LEFT HEART CATH AND CORONARY ANGIOGRAPHY;  Surgeon: Richard Crafts, MD;  Location: [facility] INVASIVE CV LAB;  Service: Cardiovascular;  Laterality: N/A;   LEFT HEART CATH AND CORONARY ANGIOGRAPHY N/A 11/21/2022   Procedure: LEFT HEART CATH AND CORONARY ANGIOGRAPHY;  Surgeon: Tonny Bollman, MD;  Location: [facility] INVASIVE CV LAB;  Service: Cardiovascular;  Laterality: N/A;   ROTATOR CUFF REPAIR  03/07/2019   SHOULDER SURGERY Left 12/2010   Rotater cuff   SPINAL FUSION  1998   Dr Nadene Rubins   [facility] SURGERY  2001   Spinal hardware removal by Dr. Lilian Kapur    Current Medications: Current Meds  Medication Sig   apixaban (ELIQUIS) 5 MG TABS tablet Take 1 tablet (5 mg total) by mouth 2 (two) times daily.   Cholecalciferol (VITAMIN D3) 125 MCG (5000 UT) CAPS Take 5,000 Units  by mouth daily.    clopidogrel (PLAVIX) 75 MG tablet TAKE ONE (1) TABLET BY MOUTH EVERY DAY (Patient taking differently: Take 75 mg by mouth daily.)   cyclobenzaprine (FLEXERIL) 10 MG tablet Take 10 mg by mouth 2 (two) times daily as needed for muscle spasms.   levocetirizine (XYZAL) 5 MG tablet Take 5 mg by mouth every evening.   nitroGLYCERIN (NITROSTAT) 0.4 MG SL tablet Place 1 tablet (0.4 mg total) under the tongue every 5 (five) minutes as needed for chest pain.   Nutritional Supplements (JUICE PLUS FIBRE PO) Take 3 tablets by mouth daily.   PARoxetine (PAXIL) 40 MG tablet Take 40 mg by mouth daily.    pregabalin (LYRICA) 75 MG capsule Take 2 capsules (150 mg total) by mouth daily. (Patient taking differently: Take 75  mg by mouth 2 (two) times daily.)   ranolazine (RANEXA) 500 MG 12 hr tablet Take 1 tablet (500 mg total) by mouth 2 (two) times daily.   rosuvastatin (CRESTOR) 40 MG tablet Take 1 tablet (40 mg total) by mouth daily.   vitamin B-12 (CYANOCOBALAMIN) 1000 MCG tablet Take 1,000 mcg by mouth daily.     Allergies:   Patient has no known allergies.   Social History   Socioeconomic History   Marital status: Married    Spouse name: Aurea Graff   Number of children: 1   Years of education: Not on file   Highest education level: Not on file  Occupational History   Occupation: Retired  Tobacco Use   Smoking status: Never   Smokeless tobacco: Never  Vaping Use   Vaping status: Never Used  Substance and Sexual Activity   Alcohol use: Not Currently    Comment: since March 2023   Drug use: Not Currently   Sexual activity: Not on file  Other Topics Concern   Not on file  Social History Narrative   Not on file   Social Determinants of Health   Financial Resource Strain: Low Risk  (02/23/2022)   Received from [facility]   Overall Financial Resource Strain (CARDIA)  Food Insecurity: No Food Insecurity (07/27/2022)   Hunger Vital Sign    Worried About Running Out of Food in the Last Year: Never true    Ran Out of Food in the Last Year: Never true  Transportation Needs: No Transportation Needs (07/27/2022)   PRAPARE - Administrator, Civil Service (Medical): No    Lack of Transportation (Non-Medical): No  Physical Activity: Insufficiently Active (02/23/2022)   Received from [facility]   Exercise Vital Sign  Stress: No Stress Concern Present (02/23/2022)   Received from [facility] visits prior to 08/06/2022., [facility] visits prior to 08/06/2022.   [employer] of Occupational Health - Occupational Stress Questionnaire    Feeling of Stress : Only a little  Social Connections: Socially Integrated  (02/23/2022)   Received from [facility] visits prior to 08/06/2022., [facility], [facility] visits prior to 08/06/2022.   Social Advertising account executive [NHANES]    Frequency of Communication with Friends and Family: More than three times a week    Frequency of Social Gatherings with Friends and Family: Patient declined    Attends Religious Services: More than 4 times per year    Active Member of [employer] or Organizations: Yes    Attends Banker Meetings: More than 4 times per  year    Marital Status: Married     Family History: The patient's family history includes Breast cancer in his mother; Dementia in his maternal grandmother and mother; Hypertension in his father and mother; Multiple sclerosis in his son; Prostate cancer in his maternal grandfather. There is no history of Colon cancer, Rectal cancer, Stomach cancer, Liver cancer, Pancreatic cancer, or Esophageal cancer. ROS:   Please see the history of present illness.    All 14 point review of systems negative except as described per history of present illness  EKGs/Labs/Other Studies Reviewed:         Recent Labs: 09/21/2022: ALT 18 11/14/2022: BUN 19; Creatinine, Ser 0.91; Hemoglobin 16.0; Platelets 227; Potassium 4.4; Sodium 142  Recent Lipid Panel    Component Value Date/Time   CHOL 116 09/21/2022 0810   TRIG 77 09/21/2022 0810   HDL 61 09/21/2022 0810   CHOLHDL 1.9 09/21/2022 0810   CHOLHDL 2.3 02/16/2021 0317   VLDL 17 02/16/2021 0317   LDLCALC 39 09/21/2022 0810    Physical Exam:    VS:  BP 118/60 (BP Location: Left Arm, Patient Position: Sitting)   Pulse 76   Ht 6' (1.829 Spence)   Wt 228 lb (103.4 kg)   SpO2 95%   BMI 30.92 kg/Spence     Wt Readings from Last 3 Encounters:  01/23/23 228 lb (103.4 kg)  11/21/22 230 lb (104.3 kg)  11/14/22 229 lb (103.9 kg)     GEN:  Well nourished, well developed in no acute distress HEENT:  Normal NECK: No JVD; No carotid bruits LYMPHATICS: No lymphadenopathy CARDIAC: RRR, no murmurs, no rubs, no gallops RESPIRATORY:  Clear to auscultation without rales, wheezing or rhonchi  ABDOMEN: Soft, non-tender, non-distended MUSCULOSKELETAL:  No edema; No deformity  SKIN: Warm and dry LOWER EXTREMITIES: no swelling NEUROLOGIC:  Alert and oriented x 3 PSYCHIATRIC:  Normal affect   ASSESSMENT:    1. CAD S/P percutaneous coronary angioplasty   2. Ischemic cardiomyopathy   3. Coronary artery disease of native artery of native heart with stable angina pectoris (HCC)   4. Paroxysmal atrial fibrillation (HCC)   5. Sinus bradycardia    PLAN:    In order of problems listed above:  Coronary artery disease stable from that point review we will continue Eliquis and and Plavix together he.  He is high risk with recurrences of issue have no difficulty taking this combination of medication we will continue for now. Dyslipidemia the [place] PN data from April 17 show LDL 39 HDL 61 this is the Crestor 40 which I will continue. Paroxysmal atrial fibrillation continue anticoagulation does not have any recurrences. History of cardiomyopathy last echocardiogram in February showed preserved left ventricle ejection fraction   Medication Adjustments/Labs and Tests Ordered: Current medicines are reviewed at length with the patient today.  Concerns regarding medicines are outlined above.  No orders of the defined types were placed in this encounter.  Medication changes: No orders of the defined types were placed in this encounter.   Signed, Georgeanna Lea, MD, [facility] 01/23/2023 1:12 PM    [facility]

## 2023-01-23 NOTE — Patient Instructions (Signed)

## 2023-02-27 ENCOUNTER — Encounter: Payer: Self-pay | Admitting: Podiatry

## 2023-02-27 ENCOUNTER — Ambulatory Visit: Payer: Medicare HMO | Admitting: Podiatry

## 2023-02-27 DIAGNOSIS — M778 Other enthesopathies, not elsewhere classified: Secondary | ICD-10-CM

## 2023-02-27 MED ORDER — TRIAMCINOLONE ACETONIDE 40 MG/ML IJ SUSP
20.0000 mg | Freq: Once | INTRAMUSCULAR | Status: AC
Start: 2023-02-27 — End: 2023-02-27
  Administered 2023-02-27: 20 mg

## 2023-02-27 NOTE — Progress Notes (Signed)
He presents today with his wife for follow-up of his capsulitis second metatarsophalangeal joint of his left foot.  He states that is been exquisitely sore recently but had done much better with the plantar injection than the dorsal injections previously.  Objective: Vital signs are stable he is alert oriented x 3 pulses are palpable.  He has pain on end range of motion of the second metatarsal phalangeal joint of his left foot.  He has tenderness on palpation of the joint both dorsally and plantarly and into the first intermetatarsal space.  Assessment: Capsulitis second metatarsophalangeal joint.  Plan: Discussed etiology pathology conservative surgical therapies at this point I went ahead and injected the second metatarsophalangeal joint with 4 mg of dexamethasone and local anesthetic from the plantar surface.  Follow-up with him on an as-needed basis.  We are awaiting his discontinuance of Eliquis prior to second metatarsal osteotomies.

## 2023-04-18 ENCOUNTER — Telehealth: Payer: Self-pay

## 2023-04-18 MED ORDER — CLOPIDOGREL BISULFATE 75 MG PO TABS: 75.0000 mg | ORAL_TABLET | Freq: Every day | ORAL | 3 refills | Status: DC

## 2023-04-18 NOTE — Telephone Encounter (Signed)
Last fill for Plavix on 04/2022 per Dr. Bing Matter.  Refill sent

## 2023-05-16 ENCOUNTER — Ambulatory Visit: Payer: Medicare HMO | Admitting: Podiatry

## 2023-05-16 DIAGNOSIS — M778 Other enthesopathies, not elsewhere classified: Secondary | ICD-10-CM | POA: Diagnosis not present

## 2023-05-16 MED ORDER — DEXAMETHASONE SODIUM PHOSPHATE 120 MG/30ML IJ SOLN
4.0000 mg | Freq: Once | INTRAMUSCULAR | Status: AC
Start: 2023-05-16 — End: 2023-05-16
  Administered 2023-05-16: 4 mg via INTRA_ARTICULAR

## 2023-05-17 ENCOUNTER — Telehealth: Payer: Self-pay

## 2023-05-17 NOTE — Telephone Encounter (Signed)
Patient's wife called - the pad you put on the patient's foot yesterday has helped tremendously. She is asking the name of the pad - she is having trouble finding it online

## 2023-05-17 NOTE — Progress Notes (Signed)
Chief Complaint  Patient presents with   Foot Pain    LT. Ball of foot is primary location pain. Some pain in toes. Sees Dr. Al Corpus and came to the [facility] office due to location. Rates pain 8/10. Elevation and not putting weight on the foot helps, using Salonpas patches which do help. Not diabetic, takes Eliquis and Plavix for anticoag.     HPI: 67 y.o. male presents today for follow-up evaluation of left second MPJ capsulitis.  Patient states he usually sees Dr. Al Corpus.  Received injection at last visit.  Pain has returned and worsened over the past couple weeks.  He is currently on Eliquis for cardiac valve replacement, he anticipates end of anticoagulation therapy in February.  Patient denies any nausea, vomiting, fever, chills, chest pain, shortness of breath.  Past Medical History:  Diagnosis Date   Abnormal cardiac CT angiography    Abnormal stress test 10/21/2019   Acute pain of left knee 04/14/2020   Altered mental status 10/25/2018   Atrial fibrillation with rapid ventricular response (HCC) 10/08/2018   Atrial fibrillation with rapid ventricular response (HCC) 10/08/2018   CAD S/P percutaneous coronary angioplasty 12/02/2019   Formatting of this note might be different from the original.  10/2019. Cath at [facility] LAD 45%. EF 55-60%     Formatting of this note might be different from the original.  10/2019. Cath at [facility] LAD 45%. EF 55-60%.     Krasowski.     Cardiomyopathy (HCC) 10/25/2018   EF 25-30% on ST. 40% on ECHO 2020.  LVH   Chest pain 02/15/2021   Chronic pain 04/27/2020   Chronic pain syndrome 04/27/2020   Coronary artery disease of native artery of native heart with stable angina pectoris (HCC) 12/02/2019   Formatting of this note might be different from the original. 10/2019. Cath at [facility] LAD 45%. EF 55-60%   De Quervain's tenosynovitis 03/02/2022   Degeneration of lumbar intervertebral disc 07/30/2015   DOE (dyspnea on exertion) 10/16/2018   Epidermal inclusion  cyst 10/12/2021   Erectile dysfunction 03/02/2022   Facet arthropathy, cervical 01/30/2020   Formatting of this note might be different from the original. Added automatically from request for surgery [id number]  Formatting of this note might be different from the original. Added automatically from request for surgery [id number]   Facial mass 12/15/2021   Formatting of this note might be different from the original. Cutaneous benign mixed tumor     Fatigue 06/06/2019   Hearing loss 10/15/2018   High risk medication use 07/30/2015   HLD (hyperlipidemia) 07/30/2015   Long-term use of aspirin therapy 10/15/2018   Malaise and fatigue 10/25/2018   Mild cognitive impairment 12/10/2018   Formatting of this note might be different from the original. 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  Added automatically from request for surgery [id number]     Pain of cervical facet joint 01/30/2020   Formatting of this note might be different from the original. Added automatically from request for surgery [id number]   Paroxysmal atrial fibrillation (HCC) 05/06/2019  Persistent atrial fibrillation, status post cardioversion in July 2020 about flip back to atrial fibrillation a few weeks later 01/21/2019   Postlaminectomy syndrome of lumbar region 04/27/2020   Postural dizziness with presyncope 10/25/2018   Prediabetes 07/30/2015   Right shoulder injury 10/08/2018   S/P arthroscopy of right shoulder 03/12/2019   S/P rotator cuff repair 03/12/2019   SI (sacroiliac) pain 07/27/2020   Sinus bradycardia 07/28/2022   Tear of  right supraspinatus tendon 10/25/2018   Tremor 07/30/2015   Unstable angina (HCC) 07/28/2022   Vitamin B 12 deficiency 07/30/2015   White matter disease, unspecified 11/19/2018   Noted on MRI of brain 2020. Discussed with pt and wife.    Past Surgical History:  Procedure Laterality Date   BACK SURGERY     CATARACT EXTRACTION, BILATERAL Bilateral 03/08/2021   03/22/2021   CHOLECYSTECTOMY     COLONOSCOPY  05/26/2016   Dr Jennye Boroughs. Diminutive colon polyp (2)   CORONARY PRESSURE/FFR STUDY N/A 07/29/2022   Procedure: INTRAVASCULAR PRESSURE WIRE/FFR STUDY;  Surgeon: Corky Crafts, MD;  Location: [facility] INVASIVE CV LAB;  Service: Cardiovascular;  Laterality: N/A;   CORONARY STENT INTERVENTION N/A 02/16/2021   Procedure: CORONARY STENT INTERVENTION;  Surgeon: Corky Crafts, MD;  Location: [facility] INVASIVE CV LAB;  Service: Cardiovascular;  Laterality: N/A;   CORONARY STENT INTERVENTION N/A 07/29/2022   Procedure: CORONARY STENT INTERVENTION;  Surgeon: Corky Crafts, MD;  Location: [facility] INVASIVE CV LAB;  Service: Cardiovascular;  Laterality: N/A;   CORONARY ULTRASOUND/IVUS N/A 02/16/2021   Procedure: Intravascular Ultrasound/IVUS;  Surgeon: Corky Crafts, MD;  Location: [facility] INVASIVE CV LAB;  Service: Cardiovascular;  Laterality: N/A;   CORONARY ULTRASOUND/IVUS N/A 07/29/2022   Procedure: Intravascular Ultrasound/IVUS;  Surgeon: Corky Crafts, MD;  Location: [facility] INVASIVE CV LAB;  Service: Cardiovascular;  Laterality: N/A;   ESOPHAGOGASTRODUODENOSCOPY  11/04/2014   Dr Jennye Boroughs. Irregular GE junction questionable signicance. Erosive gastritis. Gastric polyps. Hiatal hernia. No obvious esophageal stricture status post empiric esophageal dilatation to 54 French   HERNIA REPAIR  2009   LEFT HEART CATH AND CORONARY ANGIOGRAPHY N/A 10/25/2019   Procedure: LEFT HEART CATH AND CORONARY ANGIOGRAPHY;  Surgeon: Swaziland, Peter M, MD;  Location: [facility] INVASIVE CV LAB;  Service:  Cardiovascular;  Laterality: N/A;   LEFT HEART CATH AND CORONARY ANGIOGRAPHY N/A 02/16/2021   Procedure: LEFT HEART CATH AND CORONARY ANGIOGRAPHY;  Surgeon: Corky Crafts, MD;  Location: [facility] INVASIVE CV LAB;  Service: Cardiovascular;  Laterality: N/A;   LEFT HEART CATH AND CORONARY ANGIOGRAPHY N/A 07/29/2022   Procedure: LEFT HEART CATH AND CORONARY ANGIOGRAPHY;  Surgeon: Corky Crafts, MD;  Location: [facility] INVASIVE CV LAB;  Service: Cardiovascular;  Laterality: N/A;   LEFT HEART CATH AND CORONARY ANGIOGRAPHY N/A 11/21/2022   Procedure: LEFT HEART CATH AND CORONARY ANGIOGRAPHY;  Surgeon: Tonny Bollman, MD;  Location: [facility] INVASIVE CV LAB;  Service: Cardiovascular;  Laterality: N/A;   ROTATOR CUFF REPAIR  03/07/2019   SHOULDER SURGERY Left 12/2010   Rotater cuff   SPINAL FUSION  1998   Dr Nadene Rubins   [facility] SURGERY  2001   Spinal hardware removal by Dr. Lilian Kapur    No Known Allergies  ROS    Physical Exam: There were no vitals filed for this visit.  General: The patient is alert and oriented x3 in no acute distress.  Dermatology: Skin is warm, dry and supple bilateral lower extremities. Interspaces are clear of maceration and debris.    Vascular: Palpable pedal pulses bilaterally.  Capillary refill within normal limits.  No appreciable edema.  No erythema or calor.  Neurological: Light touch sensation grossly intact bilateral feet.   Musculoskeletal Exam: Pain to the left second MPJ with passive range of motion.  Pain on direct dorsal and plantar palpation.  Radiographic Exam:  Deferred, previous radiographs reviewed  Assessment/Plan of Care: 1. Capsulitis of foot, left      Meds ordered this encounter  Medications   dexamethasone (DECADRON) injection 4 mg   None  Discussed clinical findings with patient today.  Plan: -Discussed etiology of his second MPJ pain.  Discussed conservative versus surgical therapy. -Verbal consent obtained to perform corticosteroid  injection into the left second metatarsal phalangeal joint with 4 mg of dexamethasone, 0.5 cc of 0.5% Marcaine plain following Betadine skin prep.  Band-Aid applied reinforced with gauze and Coban for compression.  Patient tolerated this well. -Horseshoe pad dispensed to the patient today. Advised patient to continue with this -May follow-up with myself or Dr. Al Corpus on an as-needed basis. Advised patient to try and limit further injections until discontinuation of blood thinners in February, as he had been discussing possible surgery with Dr. Al Corpus at this time.   Daksh Coates L. Marchia Bond, AACFAS [facility]     [address] [place], [place] [address]                Office [phone]  Fax [phone]

## 2023-06-18 NOTE — Progress Notes (Signed)
 Cardiology Office Note:  .   Date:  06/19/2023  ID:  Richard Spence, DOB 1955-09-20, MRN [id number] PCP: Thurmond Cathlyn LABOR., MD  [facility] Providers Cardiologist:  Lamar Fitch, MD Electrophysiologist:  Soyla Gladis Norton, MD    History of Present Illness: Richard   JAYVYN Spence is a 68 y.o. male with a past medical history of PAF, CAD s/p PTCA and DES to proximal LAD 2022>> PTCA and DES of ostial LAD 2024, hypertension, dyslipidemia.  11/21/2022 left heart cath stents remain widely patent, mild diffuse disease throughout left circumflex 07/29/2022 left heart cath s/p DES of the ostial LAD >> continue Plavix  indefinitely 07/28/2022 echo EF 60 to 65%, grade 1 DD 02/16/2021 left heart cath s/p DES to proximal LAD 02/16/2021 echo EF 55 to 60%, LA moderately dilated, trivial MR, mild aortic valve sclerosis present without stenosis 10/25/2019 left heart cath nonobstructive CAD, pLAD lesion 45% stenosed  Most recently evaluated by Dr. Fitch on 01/23/2023, was doing well from a cardiac perspective, maintaining sinus rhythm, advised to continue Plavix  and Eliquis  indefinitely.  68-month follow-up.  He presents today for follow-up of his CAD.  He offers no formal complaints that are cardiac in nature, has not had any further episodes of chest pain.  He has upcoming foot surgery which she is here today for, we have not received any formal request for clearance.  He is tolerating his Eliquis  and Plavix , denies hematochezia, hematuria, hemoptysis. He denies chest pain, palpitations, dyspnea, pnd, orthopnea, n, v, dizziness, syncope, edema, weight gain, or early satiety.    ROS: Review of Systems  Musculoskeletal:        Foot pain  All other systems reviewed and are negative.    Studies Reviewed: .        Cardiac Studies & Procedures   CARDIAC CATHETERIZATION  CARDIAC CATHETERIZATION 11/21/2022  Narrative Single vessel CAD with wide patency of the recently implanted LAD  stents Continued patency of the left main, left circumflex, and RCA, with mild nonobstructive plaquing  Recommend: continue medical therapy  Findings Coronary Findings Diagnostic  Dominance: Right  Left Main Vessel is normal in caliber. Vessel is angiographically normal.  Left Anterior Descending Non-stenotic Ost LAD to Prox LAD lesion was previously treated. Vessel is the culprit lesion. Ostial LAD stent remains widely patent Non-stenotic Prox LAD lesion was previously treated. The lesion is eccentric.  Left Circumflex Vessel is normal in caliber. There is mild diffuse disease throughout the vessel.  Right Coronary Artery Vessel is normal in caliber. The vessel exhibits minimal luminal irregularities. Dominant vessel, minimal nonobstructive disease noted, patent throughout  Intervention  No interventions have been documented.   CARDIAC CATHETERIZATION  CARDIAC CATHETERIZATION 07/29/2022  Narrative   Proximal LAD stent widely patent.   Ost LAD lesion is 75% stenosed just before the proximal LAD stent which was significant by RFR at 0.86..   A drug-eluting stent was successfully placed using a STENT ONYX FRONTIER 3.5X08, postdilated to greater than 4 mm in diameter and optimized with intravascular ultrasound.   Post intervention, there is a 0% residual stenosis.   The left ventricular systolic function is normal.   LV end diastolic pressure is normal.   The left ventricular ejection fraction is 55-65% by visual estimate.   There is no aortic valve stenosis.   Severe left subclavian study which was worse than the right subclavian toward velocity encountered in the past.  Would not use the left radial artery for access.  Successful PCI  of the ostial LAD with some stent hanging back into the left main.  Would continue Plavix  indefinitely given the location of his stents.  Restart Eliquis  tomorrow.  Aspirin  for one month.  Continue aggressive preventive therapy.  As noted above,  would not use left radial artery for access in the future.  Results conveyed to the wife, Candis.  Findings Coronary Findings Diagnostic  Dominance: Right  Left Main Vessel is normal in caliber. Vessel is angiographically normal.  Left Anterior Descending Ost LAD lesion is 75% stenosed. Vessel is the culprit lesion. Pressure wire/FFR was performed on the lesion. RFR 0.86 Non-stenotic Prox LAD lesion was previously treated. The lesion is eccentric.  Left Circumflex Vessel is normal in caliber. There is mild diffuse disease throughout the vessel.  Right Coronary Artery Vessel is normal in caliber. There is mild diffuse disease throughout the vessel.  Intervention  Ost LAD lesion Stent Lesion crossed with guidewire using a WIRE ASAHI PROWATER 180CM. Pre-stent angioplasty was not performed. A drug-eluting stent was successfully placed using a STENT ONYX FRONTIER 3.5X08. Stent strut is well apposed. Stent overlaps previously placed stent. Post-stent angioplasty was performed using a BALLN [place] EMERGE MR 4.0X8. Post-Intervention Lesion Assessment The intervention was successful. Pre-interventional TIMI flow is 3. Post-intervention TIMI flow is 3. No complications occurred at this lesion. Ultrasound (IVUS) was performed on the lesion post PCI using a CATH OPTICROSS HD. Stent well apposed. There is a 0% residual stenosis post intervention.   STRESS TESTS  MYOCARDIAL PERFUSION IMAGING 03/16/2021  ECHOCARDIOGRAM  ECHOCARDIOGRAM COMPLETE 07/28/2022  Narrative ECHOCARDIOGRAM REPORT    Patient Name:   Richard Spence Date of Exam: 07/28/2022 Medical Rec #:  [id number]        Height:       72.0 in Accession #:    [id number]       Weight:       230.0 lb Date of Birth:  1956/05/28        BSA:          2.261 m Patient Age:    41 years         BP:           124/66 mmHg Patient Gender: M                HR:           55 bpm. Exam Location:  Inpatient  Procedure: 2D Echo, Cardiac Doppler,  Color Doppler and Intracardiac Opacification Agent  Indications:    Chest Pain R07.9  History:        Patient has prior history of Echocardiogram examinations, most recent 02/16/2021. Cardiomyopathy, CAD, Arrythmias:Atrial Fibrillation and Bradycardia, Signs/Symptoms:Dyspnea; Risk Factors:Dyslipidemia.  Sonographer:    Thea Norlander Referring Phys: [id number] VASUNDHRA RATHORE   Sonographer Comments: Technically difficult study due to poor echo windows, no parasternal window, suboptimal apical window and no subcostal window. IMPRESSIONS   1. Left ventricular ejection fraction, by estimation, is 60 to 65%. The left ventricle has normal function. Left ventricular endocardial border not optimally defined to evaluate regional wall motion despite the use of Definity  contrast. Left ventricular diastolic parameters are consistent with Grade I diastolic dysfunction (impaired relaxation). 2. Right ventricular systolic function is normal. The right ventricular size is normal. 3. Left atrial size was mildly dilated. 4. The aortic valve was not well visualized. Aortic valve regurgitation is not visualized. No aortic stenosis is present. 5. The mitral valve was not well visualized. No evidence of mitral valve  regurgitation.  FINDINGS Left Ventricle: Left ventricular ejection fraction, by estimation, is 60 to 65%. The left ventricle has normal function. Left ventricular endocardial border not optimally defined to evaluate regional wall motion. Definity  contrast agent was given IV to delineate the left ventricular endocardial borders. The left ventricular internal cavity size was normal in size. Suboptimal image quality limits for assessment of left ventricular hypertrophy. Left ventricular diastolic parameters are consistent with Grade I diastolic dysfunction (impaired relaxation).  Right Ventricle: The right ventricular size is normal. No increase in right ventricular wall thickness. Right  ventricular systolic function is normal.  Left Atrium: Left atrial size was mildly dilated.  Right Atrium: Right atrial size was normal in size.  Pericardium: The pericardium was not well visualized.  Mitral Valve: The mitral valve was not well visualized. No evidence of mitral valve regurgitation.  Tricuspid Valve: The tricuspid valve is grossly normal. Tricuspid valve regurgitation is not demonstrated.  Aortic Valve: The aortic valve was not well visualized. Aortic valve regurgitation is not visualized. No aortic stenosis is present. Aortic valve mean gradient measures 4.0 mmHg. Aortic valve peak gradient measures 8.8 mmHg.  Pulmonic Valve: The pulmonic valve was not assessed.  Aorta: The aortic root was not well visualized.  Venous: The inferior vena cava was not well visualized.  IAS/Shunts: The interatrial septum was not assessed.    Diastology LV e' medial:    7.30 cm/s LV E/e' medial:  8.7 LV e' lateral:   10.90 cm/s LV E/e' lateral: 5.8   RIGHT VENTRICLE RV S prime:     11.30 cm/s TAPSE (M-mode): 1.5 cm  LEFT ATRIUM           Index LA Vol (A4C): 57.1 ml 25.25 ml/m AORTIC VALVE AV Vmax:           148.00 cm/s AV Vmean:          95.400 cm/s AV VTI:            0.304 m AV Peak Grad:      8.8 mmHg AV Mean Grad:      4.0 mmHg LVOT Vmax:         128.00 cm/s LVOT Vmean:        79.700 cm/s LVOT VTI:          0.261 m LVOT/AV VTI ratio: 0.86  MITRAL VALVE MV Area (PHT): 3.37 cm    SHUNTS MV Decel Time: 225 msec    Systemic VTI: 0.26 m MV E velocity: 63.60 cm/s MV A velocity: 85.50 cm/s MV E/A ratio:  0.74  Soyla Merck MD Electronically signed by Soyla Merck MD Signature Date/Time: 07/28/2022/8:40:16 PM    Final    CT SCANS  CT CORONARY FRACTIONAL FLOW RESERVE DATA PREP 02/15/2021  Narrative EXAM: FFRCT ANALYSIS  FINDINGS: FFRct analysis was performed on the original cardiac CT angiogram dataset. Diagrammatic representation of the FFRct  analysis is provided in a separate PDF document in PACS. This dictation was created using the PDF document and an interactive 3D model of the results. 3D model is not available in the EMR/PACS. Normal FFR range is >0.80.  1. Left Main: 0.98 2. LAD: Prox 0.96, distal to prox LAD lesion 0.64, distal LAD 0.59 3. LCX: Prox 0.97, mid 0.97, distal 0.96 4. RCA: Prox 0.98, mid 0.97, distal 0.96  IMPRESSION: Hemodynamically significant proximal LAD stenosis noted with FFR 0.64  Lamar JINNY Fitch, MD   Electronically Signed By: Lamar Fitch M.D. On: 02/15/2021 13:58   CT  SCANS  CT CORONARY MORPH W/CTA COR W/SCORE 02/15/2021  Addendum 02/15/2021 12:56 PM ADDENDUM REPORT: 02/15/2021 12:54  CLINICAL DATA:  CP with normal stress test  EXAM: Cardiac/Coronary  CTA  TECHNIQUE: The patient was scanned on a [employer].  FINDINGS: A 120 kV prospective scan was triggered in the descending thoracic aorta at 111 HU's. Axial non-contrast 3 mm slices were carried out through the heart. The data set was analyzed on a dedicated work station and scored using the Agatson method. Gantry rotation speed was 250 msecs and collimation was .6 mm. No beta blockade and 0.8 mg of sl NTG was given. The 3D data set was reconstructed in 5% intervals of the 67-82 % of the R-R cycle. Diastolic phases were analyzed on a dedicated work station using MPR, MIP and VRT modes. The patient received 80 cc of contrast.  Aorta:  Normal size.  No calcifications.  No dissection.  Aortic Valve:  Trileaflet.  No calcifications.  Coronary Arteries:  Normal coronary origin.  Right dominance.  RCA is a large dominant artery that gives rise to PDA and PLA. In the mid portion of the artery calcified, minimal, non obstructive (0-25%) plaque is noted.  Left main is a large artery that gives rise to LAD and LCX arteries. In the distal portion of this artery small, calcified, non obstructive (0-25%)  plaque is noted.  LAD is a large vessel. In the proximal portion of this artery complex plaque is present with intraplaque dye penetration. This plaque cause severe stenosis (70-79%). Small D1 is noted distally.  LCX is a non-dominant artery that trifurcates to moderate size OM1, moderate size OM2 and CX. OM2 has minimal non obstructive (0-25%) calcified plaque.  Other findings:  Normal pulmonary vein drainage into the left atrium. Right mid pulmonary vein noted - normal variant.  Normal left atrial appendage without a thrombus.  Normal size of the pulmonary artery.  IMPRESSION: 1. Coronary calcium  score of 92.  2. Normal coronary origin with right dominance.  3. CAD-RADS 4 Severe stenosis. (70-99% or > 50% left main). Cardiac catheterization or CT FFR is recommended. Consider symptom-guided anti-ischemic pharmacotherapy as well as risk factor modification per guideline directed care.  Lamar JINNY Fitch, MD   Electronically Signed By: Lamar Fitch M.D. On: 02/15/2021 12:54  Narrative EXAM: OVER-READ INTERPRETATION  CT CHEST  The following report is an over-read performed by radiologist Dr. Franky Crease of [facility] on 02/15/2021. This over-read does not include interpretation of cardiac or coronary anatomy or pathology. The coronary CTA interpretation by the cardiologist is attached.  COMPARISON:  01/21/2021  FINDINGS: Vascular: Heart is normal size.  Aorta normal caliber.  Mediastinum/Nodes: No adenopathy. Small hiatal hernia again noted, unchanged.  Lungs/Pleura: No confluent opacities or effusions.  Upper Abdomen: Imaging into the upper abdomen demonstrates no acute findings.  Musculoskeletal: Chest wall soft tissues are unremarkable. No acute bony abnormality.  IMPRESSION: No acute extra cardiac abnormality.  Electronically Signed: By: Franky Crease M.D. On: 02/15/2021 09:55          Risk Assessment/Calculations:     CHA2DS2-VASc Score = 4   This indicates a 4.8% annual risk of stroke. The patient's score is based upon: CHF History: 1 HTN History: 1 Diabetes History: 0 Stroke History: 0 Vascular Disease History: 1 Age Score: 1 Gender Score: 0            Physical Exam:   VS:  BP 110/70 (BP Location: Left Arm, Patient Position:  Sitting, Cuff Size: Normal)   Pulse 82   Ht 6' (1.829 m)   Wt 235 lb (106.6 kg)   SpO2 94%   BMI 31.87 kg/m    Wt Readings from Last 3 Encounters:  06/19/23 235 lb (106.6 kg)  01/23/23 228 lb (103.4 kg)  11/21/22 230 lb (104.3 kg)    GEN: Well nourished, well developed in no acute distress NECK: No JVD; No carotid bruits CARDIAC: RRR, no murmurs, rubs, gallops RESPIRATORY:  Clear to auscultation without rales, wheezing or rhonchi  ABDOMEN: Soft, non-tender, non-distended EXTREMITIES:  + edema left foot; No deformity   ASSESSMENT AND PLAN: .   CAD -no recurrent episodes of chest pain. Stable with no anginal symptoms. No indication for ischemic evaluation.  Continue Plavix  75 mg daily, currently on Eliquis  5 mg twice daily, continue Ranexa  500 mg twice daily, continue Crestor  40 mg daily.  PAF/hypercoagulable state -he is maintaining sinus rhythm, CHA2DS2-VASc or 4, continue Eliquis  5 mg twice daily--no indication for dose reduction, will check CBC BMET today.  HFpEF -NYHA class I, euvolemic.  Dyslipidemia -most recent LDL was well-controlled on 09/30/2022, continue Crestor  40 mg daily.  Preoperative cardiovascular evaluation--we have not received a formal request from his surgeon, he states he was only advised he needed to hold his Plavix  and not his Eliquis .  Encouraged him to reach out to his surgeon to send us  a formal request. According to the Revised Cardiac Risk Index (RCRI), his Perioperative Risk of Major Cardiac Event is (%): 0.9 His Functional Capacity in METs is: 6.61 according to the Duke Activity Status Index (DASI). Therefore, based on ACC/AHA  guidelines, patient would be at acceptable risk for the planned procedure without further cardiovascular testing. I will route this recommendation to the requesting party via Epic fax function.        Dispo: CBC, BMET today, follow-up in 6 months.  Signed, Delon JAYSON Hoover, NP

## 2023-06-19 ENCOUNTER — Ambulatory Visit: Payer: Medicare (Managed Care) | Attending: Cardiology | Admitting: Cardiology

## 2023-06-19 ENCOUNTER — Encounter: Payer: Self-pay | Admitting: Cardiology

## 2023-06-19 VITALS — BP 110/70 | HR 82 | Ht 72.0 in | Wt 235.0 lb

## 2023-06-19 DIAGNOSIS — Z01810 Encounter for preprocedural cardiovascular examination: Secondary | ICD-10-CM

## 2023-06-19 DIAGNOSIS — D6859 Other primary thrombophilia: Secondary | ICD-10-CM

## 2023-06-19 DIAGNOSIS — I2510 Atherosclerotic heart disease of native coronary artery without angina pectoris: Secondary | ICD-10-CM

## 2023-06-19 DIAGNOSIS — E782 Mixed hyperlipidemia: Secondary | ICD-10-CM

## 2023-06-19 DIAGNOSIS — I5030 Unspecified diastolic (congestive) heart failure: Secondary | ICD-10-CM

## 2023-06-19 DIAGNOSIS — I480 Paroxysmal atrial fibrillation: Secondary | ICD-10-CM

## 2023-06-19 DIAGNOSIS — Z9861 Coronary angioplasty status: Secondary | ICD-10-CM

## 2023-06-19 NOTE — Patient Instructions (Signed)
 Medication Instructions:  Your physician recommends that you continue on your current medications as directed. Please refer to the Current Medication list given to you today.  *If you need a refill on your cardiac medications before your next appointment, please call your pharmacy*   Lab Work: Your physician recommends that you return for lab work in: Today for CBC and BMP  If you have labs (blood work) drawn today and your tests are completely normal, you will receive your results only by: MyChart Message (if you have MyChart) OR A paper copy in the mail If you have any lab test that is abnormal or we need to change your treatment, we will call you to review the results.   Testing/Procedures: NONE   Follow-Up: At [facility], you and your health needs are our priority.  As part of our continuing mission to provide you with exceptional heart care, we have created designated Provider Care Teams.  These Care Teams include your primary Cardiologist (physician) and Advanced Practice Providers (APPs -  Physician Assistants and Nurse Practitioners) who all work together to provide you with the care you need, when you need it.  We recommend signing up for the patient portal called MyChart.  Sign up information is provided on this After Visit Summary.  MyChart is used to connect with patients for Virtual Visits (Telemedicine).  Patients are able to view lab/test results, encounter notes, upcoming appointments, etc.  Non-urgent messages can be sent to your provider as well.   To learn more about what you can do with MyChart, go to [email or web address].    Your next appointment:   6 month(s)  Provider:   Lamar Fitch, MD    Other Instructions Please call surgeon and request a surgical clearance request be sent to our office [phone]

## 2023-06-20 LAB — BASIC METABOLIC PANEL WITH GFR
BUN/Creatinine Ratio: 19 (ref 10–24)
BUN: 20 mg/dL (ref 8–27)
CO2: 23 mmol/L (ref 20–29)
Calcium: 9 mg/dL (ref 8.6–10.2)
Chloride: 105 mmol/L (ref 96–106)
Creatinine, Ser: 1.04 mg/dL (ref 0.76–1.27)
Glucose: 95 mg/dL (ref 70–99)
Potassium: 4.1 mmol/L (ref 3.5–5.2)
Sodium: 142 mmol/L (ref 134–144)
eGFR: 78 mL/min/1.73

## 2023-06-20 LAB — CBC WITH DIFFERENTIAL/PLATELET
Basophils Absolute: 0 x10E3/uL (ref 0.0–0.2)
Basos: 1 %
EOS (ABSOLUTE): 0.1 x10E3/uL (ref 0.0–0.4)
Eos: 2 %
Hematocrit: 50 % (ref 37.5–51.0)
Hemoglobin: 16.8 g/dL (ref 13.0–17.7)
Immature Grans (Abs): 0 x10E3/uL (ref 0.0–0.1)
Immature Granulocytes: 0 %
Lymphocytes Absolute: 1.9 x10E3/uL (ref 0.7–3.1)
Lymphs: 26 %
MCH: 32.1 pg (ref 26.6–33.0)
MCHC: 33.6 g/dL (ref 31.5–35.7)
MCV: 95 fL (ref 79–97)
Monocytes Absolute: 0.6 x10E3/uL (ref 0.1–0.9)
Monocytes: 9 %
Neutrophils Absolute: 4.4 x10E3/uL (ref 1.4–7.0)
Neutrophils: 62 %
Platelets: 226 x10E3/uL (ref 150–450)
RBC: 5.24 x10E6/uL (ref 4.14–5.80)
RDW: 13 % (ref 11.6–15.4)
WBC: 7.1 x10E3/uL (ref 3.4–10.8)

## 2023-07-11 ENCOUNTER — Ambulatory Visit: Payer: Medicare (Managed Care) | Admitting: Podiatry

## 2023-07-11 ENCOUNTER — Telehealth: Payer: Self-pay | Admitting: Podiatry

## 2023-07-11 NOTE — Telephone Encounter (Signed)
Called PT today to reschedule is app. PT stated that they are in severe pain, and has asked for medication for now until their app to see Dr. Al Corpus.  As a note PT is on blood thinners and can't take anything with motrin.

## 2023-07-18 ENCOUNTER — Telehealth: Payer: Self-pay | Admitting: Cardiology

## 2023-07-18 ENCOUNTER — Ambulatory Visit (INDEPENDENT_AMBULATORY_CARE_PROVIDER_SITE_OTHER): Payer: Medicare (Managed Care) | Admitting: Podiatry

## 2023-07-18 ENCOUNTER — Encounter: Payer: Self-pay | Admitting: Podiatry

## 2023-07-18 DIAGNOSIS — M2012 Hallux valgus (acquired), left foot: Secondary | ICD-10-CM

## 2023-07-18 NOTE — Telephone Encounter (Signed)
   Pre-operative Risk Assessment    Patient Name: Richard Spence  DOB: November 18, 1955 MRN: [id number]   Date of last office visit: 06/19/23 Date of next office visit: Not yet scheduled   Request for Surgical Clearance    Procedure:   Austin bunionectomy on left foot and Metatarsal osteotomy second toe, left foot  Date of Surgery:  Clearance 08/18/23                                Surgeon:  Dr. Ernestene Kiel Surgeon's Group or Practice Name:  [facility] Phone number:  [phone]  Fax number:  [phone]   Type of Clearance Requested:   - Pharmacy:  Hold        Type of Anesthesia:   Choice   Additional requests/questions:   Caller Morrie Sheldon) stated patient will need to hold blood thinners prior to surgery.  Signed, Annetta Maw   07/18/2023, 1:53 PM

## 2023-07-18 NOTE — Telephone Encounter (Signed)
Redgie Grayer, thank you for your great preop clearance note. Did you discuss Plavix hold with Dr. Bing Matter in the setting of ostial LAD stent?  Please route your response to p cv div preop.  Thanks, Marcelino Duster

## 2023-07-18 NOTE — Progress Notes (Signed)
He and his wife presents today for follow-up of pain to his left foot.  He states that I would like to go ahead and have this surgically corrected if at all possible.  He states that I tried everything that we can including the injections the wider shoes different medications that can take the did not interfere with my blood thinners.  He states that nothing seems to be rendering him asymptomatic.  Objective: Vital signs are stable alert oriented x 3 I have reviewed his past medical history medications and allergies which are significant for heart disease and continues to take Plavix and Eliquis for his A-fib.  Pulses are strongly palpable to the left lower extremity he has pain on palpation and range of motion of the second metatarsophalangeal joint and a reducible first metatarsal phalangeal joint that does have some crepitation on range of motion.  He has slightly limited range of motion particularly dorsiflexion with crepitation.  There is obviously some osteoarthritic change noted.  He has mild hammertoe deformity second but is flexible.  I have reviewed previous radiographs which do demonstrate an increase in the first intermetatarsal angle greater than normal value would have hallux abductus angle greater than normal value and dorsal spurring.  This is all consistent with hallux limitus and hallux valgus deformity and osteoarthritic change.  With a increase in the first intermetatarsal angle and a slightly elongated first metatarsal this is most likely resulting his symptomatology.  Assessment: Pain in limb secondary to hallux limitus first metatarsophalangeal joint mild hammertoe deformity but a plantarflexed elongated second metatarsal is the primary culprit here.  Plan: Discussed etiology pathology conservative surgical therapies we will have to consult cardiology and get in writing that we are able to stop his Plavix and his Eliquis for minimum of 3 days so that we can perform the surgery  starting him back the night of surgery.  I feel that this surgeries will give him the best opportunity to regain as much of a pain-free foot as possible.  We did discuss the possible side effects and complications associated with this which may include but not limited to postop pain bleeding swell infection recurrence need from surgery overcorrection under correction loss of digit loss limb loss of life.  He understands this and is amenable to it.  This performed at outpatient surgical center setting with sciatic block and minimal general anesthesia.  Postoperatively he will receive pain medication and antibiotic and an antiemetic   We will await the recommendation from cardiology.

## 2023-07-19 ENCOUNTER — Telehealth: Payer: Self-pay | Admitting: Urology

## 2023-07-19 NOTE — Telephone Encounter (Signed)
[facility] is calling back asking about how long to hold the Eliquis for. We told them about Plavix but not Eliquis

## 2023-07-19 NOTE — Telephone Encounter (Signed)
   Patient Name: Richard Spence  DOB: [birth year] MRN: [id number]  Primary Cardiologist: [name], MD  Chart reviewed as part of pre-operative protocol coverage.  Patient can hold Plavix 5 days prior to scheduled procedure and should restart postprocedure when surgically safe and hemostasis is achieved.  I will route this recommendation to the requesting party via Epic fax function and remove from pre-op pool.  Please call with questions.  Napoleon Form, Leodis Rains, NP 07/19/2023, 7:08 AM

## 2023-07-19 NOTE — Telephone Encounter (Signed)
Called to inform pt of when Cardiologist said to stop Plavix before sx. I LM stating that the cardiologist said to hold Plavix 5 days prior to sx on 08/18/23 with Dr. Al Corpus and if they have any questions or concerns to please call the office.

## 2023-07-20 NOTE — Telephone Encounter (Signed)
I will forward to preop APP and Pharm-d about Eliquis. Eliquis needs to be addressed by pharm-d.

## 2023-07-20 NOTE — Telephone Encounter (Signed)
Patient with diagnosis of afib on Eliquis for anticoagulation.    Procedure: [place] bunionectomy on left foot and Metatarsal osteotomy second toe, left foot  Date of procedure: 08/18/23   CHA2DS2-VASc Score = 4   This indicates a 4.8% annual risk of stroke. The patient's score is based upon: CHF History: 1 HTN History: 1 Diabetes History: 0 Stroke History: 0 Vascular Disease History: 1 Age Score: 1 Gender Score: 0   CrCl >100 mL/min Platelet count 226 K    Per office protocol, patient can hold Eliquis for 3 days prior to procedure.     **This guidance is not considered finalized until pre-operative APP has relayed final recommendations.**

## 2023-07-20 NOTE — Telephone Encounter (Signed)
   Patient Name: Richard Spence  DOB: Dec 01, 1955 MRN: [id number]  Primary Cardiologist: [name], MD  Clinical pharmacists have reviewed the patient's past medical history, labs, and current medications as part of preoperative protocol coverage. The following recommendations have been made:  Per office protocol, patient can hold Eliquis for 3 days prior to procedure. Please resume Eliquis as soon as possible postprocedure, at the discretion of the surgeon.    I will route this recommendation to the requesting party via Epic fax function and remove from pre-op pool.  Please call with questions.  Denyce Robert, NP 07/20/2023, 3:13 PM

## 2023-07-24 ENCOUNTER — Telehealth: Payer: Self-pay | Admitting: Podiatry

## 2023-07-24 NOTE — Telephone Encounter (Signed)
 DOS- 08/18/23  Serafina Royals [id number] MET. OSTEOTOMY 2ND [id number]  [employer] EFFECTIVE DATE- 06/07/23  PER AN INCOMING FAX FROM [employer], PRIOR AUTH HAS BEEN APPROVED FOR CPT CODES 95284 AND [phone]. GOOD FROM 08/18/23 - 11/18/23.  AUTH REF #: [id number]  AUTH REF #: [id number]

## 2023-08-09 ENCOUNTER — Other Ambulatory Visit: Payer: Self-pay

## 2023-08-09 MED ORDER — ROSUVASTATIN CALCIUM 40 MG PO TABS: 40.0000 mg | ORAL_TABLET | Freq: Every day | ORAL | 3 refills | Status: AC

## 2023-08-17 ENCOUNTER — Other Ambulatory Visit: Payer: Self-pay | Admitting: Podiatry

## 2023-08-17 MED ORDER — CEPHALEXIN 500 MG PO CAPS: 500.0000 mg | ORAL_CAPSULE | Freq: Three times a day (TID) | ORAL | 0 refills | Status: DC

## 2023-08-17 MED ORDER — OXYCODONE-ACETAMINOPHEN 10-325 MG PO TABS: 1.0000 | ORAL_TABLET | Freq: Three times a day (TID) | ORAL | 0 refills | Status: AC | PRN

## 2023-08-17 MED ORDER — ONDANSETRON HCL 4 MG PO TABS: 4.0000 mg | ORAL_TABLET | Freq: Three times a day (TID) | ORAL | 0 refills | Status: DC | PRN

## 2023-08-18 DIAGNOSIS — M2012 Hallux valgus (acquired), left foot: Secondary | ICD-10-CM | POA: Diagnosis not present

## 2023-08-18 DIAGNOSIS — M21542 Acquired clubfoot, left foot: Secondary | ICD-10-CM | POA: Diagnosis not present

## 2023-08-24 ENCOUNTER — Ambulatory Visit (INDEPENDENT_AMBULATORY_CARE_PROVIDER_SITE_OTHER): Payer: Medicare (Managed Care)

## 2023-08-24 ENCOUNTER — Ambulatory Visit (INDEPENDENT_AMBULATORY_CARE_PROVIDER_SITE_OTHER): Payer: Medicare (Managed Care) | Admitting: Podiatry

## 2023-08-24 ENCOUNTER — Encounter: Payer: Self-pay | Admitting: Podiatry

## 2023-08-24 DIAGNOSIS — M2012 Hallux valgus (acquired), left foot: Secondary | ICD-10-CM | POA: Diagnosis not present

## 2023-08-24 DIAGNOSIS — Z9889 Other specified postprocedural states: Secondary | ICD-10-CM

## 2023-08-24 DIAGNOSIS — M21962 Unspecified acquired deformity of left lower leg: Secondary | ICD-10-CM

## 2023-08-24 NOTE — Progress Notes (Signed)
 Richard Spence presents today date of surgery 08/18/2023 left foot [place] bunionectomy second metatarsal osteotomy with screw fixation.  He states that he has not been much pain until yesterday and then it was really painful.  He had to take pain medicine.  Objective: Vital signs are stable he is alert oriented x 3.  There is moderate edema moderate erythema no cellulitis or odor he does have some bloody drainage through the distalmost aspect of the bunion wound.  He has good range of motion of the first metatarsophalangeal joint no dehiscences.  Second toe is sitting rectus.  Radiographs taken today demonstrate osseously mature individual with capital osteotomy with 2 screws first metatarsal as well as second metatarsal.  These are in good alignment there is considerable soft tissue edema noticed on radiographs.  Assessment: + Well-healing surgical foot x 1 week.  Plan: Redressed today dressed a compressive dressing he will continue limited weightbearing also continue keeping this dry and elevated.  Encouraged range of motion of the toes.  Follow-up with him in 1 to 2 weeks.

## 2023-09-07 ENCOUNTER — Encounter: Payer: Self-pay | Admitting: Podiatry

## 2023-09-07 ENCOUNTER — Encounter: Payer: Medicare (Managed Care) | Admitting: Podiatry

## 2023-09-07 ENCOUNTER — Ambulatory Visit (INDEPENDENT_AMBULATORY_CARE_PROVIDER_SITE_OTHER): Payer: Medicare (Managed Care) | Admitting: Podiatry

## 2023-09-07 DIAGNOSIS — Z9889 Other specified postprocedural states: Secondary | ICD-10-CM

## 2023-09-07 DIAGNOSIS — M2012 Hallux valgus (acquired), left foot: Secondary | ICD-10-CM

## 2023-09-07 DIAGNOSIS — M21962 Unspecified acquired deformity of left lower leg: Secondary | ICD-10-CM

## 2023-09-07 NOTE — Progress Notes (Signed)
 He presents today with his postop visit date of surgery 08/18/2023.  Status post [facility] bunion repair left second metatarsal osteotomy left.  He denies fever chills nausea vomit muscle aches pains states that symptoms been rubbing his ankle and this been bothering him that way.  His wife goes on to say that he has been walking the dog on long walks to the mailbox.  Objective: Vitals are stable alert oriented x 3.  Pulses are palpable.  Presents today wearing his boot dressed toe dressing intact today and has had some drainage overlying the second metatarsal phalangeal joint.  There is no erythema overlying this sutures are intact margins are well coapted but there is considerable amount of edema.  Assessment well-healing surgical foot though it is moderately edematous more so than I would like at this point in time.  Plan: I redressed the foot today put more compression on it did not wrap it around the ankle hoping that the the ankle will feel better.  I am going to place him in a Darco shoe and we will follow-up with him in 1 week.

## 2023-09-14 ENCOUNTER — Ambulatory Visit: Payer: Medicare (Managed Care) | Admitting: Podiatry

## 2023-09-14 DIAGNOSIS — Z9889 Other specified postprocedural states: Secondary | ICD-10-CM

## 2023-09-14 DIAGNOSIS — M21962 Unspecified acquired deformity of left lower leg: Secondary | ICD-10-CM

## 2023-09-14 DIAGNOSIS — M2012 Hallux valgus (acquired), left foot: Secondary | ICD-10-CM | POA: Diagnosis not present

## 2023-09-14 NOTE — Progress Notes (Signed)
 He presents today date of surgery 08/18/2023 [facility] bunionectomy second metatarsal osteotomy.  States that is feeling pretty good.  Objective: Vitals are stable alert oriented x 3.  Pulses are palpable.  Moderate edema no erythema no cellulitis drainage or odor.  He has good range of motion first metatarsophalangeal joint margins are well coapted for the sutures.  Remove the sutures today margins remain well coapted.  Assessment: Well-healed surgical foot.  Plan: Remove the sutures today placed him in a compression dressing unguinal allow him to start being up on the foot a little bit more but I like to follow-up with him in about 2 weeks for another set of x-rays.

## 2023-09-28 ENCOUNTER — Ambulatory Visit (INDEPENDENT_AMBULATORY_CARE_PROVIDER_SITE_OTHER): Payer: Medicare (Managed Care)

## 2023-09-28 ENCOUNTER — Encounter: Payer: Medicare (Managed Care) | Admitting: Podiatry

## 2023-09-28 ENCOUNTER — Ambulatory Visit (INDEPENDENT_AMBULATORY_CARE_PROVIDER_SITE_OTHER): Payer: Medicare (Managed Care) | Admitting: Podiatry

## 2023-09-28 DIAGNOSIS — Z9889 Other specified postprocedural states: Secondary | ICD-10-CM

## 2023-09-28 DIAGNOSIS — M2012 Hallux valgus (acquired), left foot: Secondary | ICD-10-CM | POA: Diagnosis not present

## 2023-09-28 DIAGNOSIS — M21962 Unspecified acquired deformity of left lower leg: Secondary | ICD-10-CM

## 2023-09-28 NOTE — Progress Notes (Signed)
 Richard Spence presents today with his wife date of surgery 08/18/2023 for a third postop visit status post bunionectomy and second metatarsal osteotomy left foot.  He is concerned about the swelling states that he still has numbness and tingling.  Objective: Vitals are stable oriented x 3.  Moderate edema to the first metatarsophalangeal joint and first intermetatarsal space.  He has great range of motion that is pain-free but does demonstrate a firm edema in the aforementioned area.  Radiographs taken today do demonstrate a well-healing surgical foot internal fixation is in good position with good compression capital fragment it is in good position.  Assessment: Well-healing surgical foot status post bunion repair and second metatarsal osteotomy.  Plan: Compression ankle use manage patient states that already feels like it is going down.  Did advise him to try to get into a tennis shoe in the mornings and maybe switch back to the Darco shoe or cam boot during the day.  He will do so I will follow-up with him in about 3 weeks for another set of x-rays.Aaron Aas

## 2023-10-09 ENCOUNTER — Other Ambulatory Visit: Payer: Self-pay

## 2023-10-09 MED ORDER — RANOLAZINE ER 500 MG PO TB12: 500.0000 mg | ORAL_TABLET | Freq: Two times a day (BID) | ORAL | 3 refills | Status: DC

## 2023-10-18 ENCOUNTER — Telehealth: Payer: Self-pay | Admitting: Cardiology

## 2023-10-18 ENCOUNTER — Other Ambulatory Visit (HOSPITAL_BASED_OUTPATIENT_CLINIC_OR_DEPARTMENT_OTHER): Payer: Self-pay

## 2023-10-18 MED ORDER — RANOLAZINE ER 500 MG PO TB12
500.0000 mg | ORAL_TABLET | Freq: Two times a day (BID) | ORAL | 3 refills | Status: AC
  Filled 2023-10-18: qty 180, 90d supply, fill #0
  Filled 2024-02-06: qty 180, 90d supply, fill #1

## 2023-10-18 NOTE — Telephone Encounter (Signed)
 Pt c/o medication issue:  1. Name of Medication: ranolazine  (RANEXA ) 500 MG 12 hr tablet   2. How are you currently taking this medication (dosage and times per day)? As written  3. Are you having a reaction (difficulty breathing--STAT)? No  4. What is your medication issue? Pharmacy states the 500 MG tablets are on back order and would like to know if the DR could write a script for 1000 MG tablets she also states the 1000 MG tablet can't be broken in half. Please advise

## 2023-10-18 NOTE — Telephone Encounter (Signed)
 Spoke with Libby Ree and advised that [facility] was out of medication and that [facility] pharmacy will have in stock tomorrow. Libby Ree ok with sending RX to [facility] pharmacy.

## 2023-10-19 ENCOUNTER — Other Ambulatory Visit (HOSPITAL_BASED_OUTPATIENT_CLINIC_OR_DEPARTMENT_OTHER): Payer: Self-pay

## 2023-10-19 ENCOUNTER — Ambulatory Visit (INDEPENDENT_AMBULATORY_CARE_PROVIDER_SITE_OTHER): Payer: Medicare (Managed Care) | Admitting: Podiatry

## 2023-10-19 ENCOUNTER — Ambulatory Visit (INDEPENDENT_AMBULATORY_CARE_PROVIDER_SITE_OTHER): Payer: Medicare (Managed Care)

## 2023-10-19 DIAGNOSIS — M2012 Hallux valgus (acquired), left foot: Secondary | ICD-10-CM | POA: Diagnosis not present

## 2023-10-19 NOTE — Progress Notes (Signed)
 He presents today for postop visit date of surgery 08/18/2023 [facility] repair second metatarsal osteotomy left foot.  States that feels great other than the swelling.  He states the swelling is completely down in the mornings when he gets up but swells up back by the end of the day.  Objective: Vital signs stable oriented x 3 pulses are palpable.  Edema to the forefoot but has great range of motion of the 1st and 2nd toes without complications.  Radiographs taken today demonstrate subcutaneous edema but osseous components abdominal to heal uneventfully.  Screws are intact for the proximal screws to the first metatarsal appears to be a little bit loose but has not moved since last x-ray.  Assessment: Well-healing surgical foot moderate edema.  Plan: Recommended that he put the foot in his shoe as soon as he gets up out of bed in the morning continues to wear the compression hose as well should swell throughout the day then go back to the Darco shoe but I feel that he needs to train the edema and have acute edema from sitting up in the foot.  Follow-up with him in 1 month for another set of x-rays.

## 2023-11-16 ENCOUNTER — Ambulatory Visit (INDEPENDENT_AMBULATORY_CARE_PROVIDER_SITE_OTHER): Payer: Medicare (Managed Care)

## 2023-11-16 ENCOUNTER — Encounter: Payer: Self-pay | Admitting: Podiatry

## 2023-11-16 ENCOUNTER — Ambulatory Visit (INDEPENDENT_AMBULATORY_CARE_PROVIDER_SITE_OTHER): Payer: Medicare (Managed Care) | Admitting: Podiatry

## 2023-11-16 DIAGNOSIS — M21962 Unspecified acquired deformity of left lower leg: Secondary | ICD-10-CM

## 2023-11-16 DIAGNOSIS — M2012 Hallux valgus (acquired), left foot: Secondary | ICD-10-CM | POA: Diagnosis not present

## 2023-11-16 DIAGNOSIS — Z9889 Other specified postprocedural states: Secondary | ICD-10-CM

## 2023-11-16 MED ORDER — METHYLPREDNISOLONE 4 MG PO TBPK
ORAL_TABLET | ORAL | 0 refills | Status: DC
Start: 2023-11-16 — End: 2023-11-29

## 2023-11-16 NOTE — Progress Notes (Signed)
 He presents today date of surgery 314 2025-second metatarsal osteotomy and bunionectomy left.  He states that he is doing pretty well though he is getting some soreness beneath the second metatarsal head because of the swelling he thinks.  States the swelling is gone down nearly completely and that he is nontender in the mornings with gait until the evening when it swells up and is painful.  Objective: Vital signs are stable alert oriented x 3.  Pulses are palpable.  He has swelling to the left foot.  Radiographs taken today demonstrate secondary healing with some capital fragment motion of the first metatarsal phalangeal joint.  Assessment: Slowly resolving edema status post [place] bunion and second met osteotomy.  Plan: I am going to follow-up with him in 1 month I am also going to send over a Medrol Dosepak.

## 2023-11-29 ENCOUNTER — Encounter: Payer: Self-pay | Admitting: Cardiology

## 2023-11-29 ENCOUNTER — Ambulatory Visit: Payer: Medicare (Managed Care) | Attending: Cardiology | Admitting: Cardiology

## 2023-11-29 VITALS — BP 120/80 | HR 65 | Ht 72.0 in | Wt 233.0 lb

## 2023-11-29 DIAGNOSIS — I2510 Atherosclerotic heart disease of native coronary artery without angina pectoris: Secondary | ICD-10-CM

## 2023-11-29 DIAGNOSIS — I5030 Unspecified diastolic (congestive) heart failure: Secondary | ICD-10-CM

## 2023-11-29 DIAGNOSIS — I480 Paroxysmal atrial fibrillation: Secondary | ICD-10-CM

## 2023-11-29 DIAGNOSIS — D6859 Other primary thrombophilia: Secondary | ICD-10-CM | POA: Diagnosis not present

## 2023-11-29 DIAGNOSIS — Z9861 Coronary angioplasty status: Secondary | ICD-10-CM | POA: Diagnosis not present

## 2023-11-29 DIAGNOSIS — E782 Mixed hyperlipidemia: Secondary | ICD-10-CM

## 2023-11-29 NOTE — Patient Instructions (Signed)
 Medication Instructions:  Your physician recommends that you continue on your current medications as directed. Please refer to the Current Medication list given to you today.  *If you need a refill on your cardiac medications before your next appointment, please call your pharmacy*  Lab Work: None If you have labs (blood work) drawn today and your tests are completely normal, you will receive your results only by: MyChart Message (if you have MyChart) OR A paper copy in the mail If you have any lab test that is abnormal or we need to change your treatment, we will call you to review the results.  Testing/Procedures: None  Follow-Up: At [facility], you and your health needs are our priority.  As part of our continuing mission to provide you with exceptional heart care, our providers are all part of one team.  This team includes your primary Cardiologist (physician) and Advanced Practice Providers or APPs (Physician Assistants and Nurse Practitioners) who all work together to provide you with the care you need, when you need it.  Your next appointment:   Keep follow up appointment with Dr. Bernie in 2 weeks.  Provider:   Lamar Bernie, MD    We recommend signing up for the patient portal called MyChart.  Sign up information is provided on this After Visit Summary.  MyChart is used to connect with patients for Virtual Visits (Telemedicine).  Patients are able to view lab/test results, encounter notes, upcoming appointments, etc.  Non-urgent messages can be sent to your provider as well.   To learn more about what you can do with MyChart, go to [email or web address].   Other Instructions None

## 2023-11-29 NOTE — Progress Notes (Signed)
 Cardiology Office Note:  .   Date:  11/29/2023  ID:  Richard Spence, DOB 1955/09/18, MRN [id number] PCP: Thurmond Cathlyn LABOR., MD  [facility] Providers Cardiologist:  Lamar Fitch, MD Electrophysiologist:  Soyla Gladis Norton, MD    History of Present Illness: Richard Spence is a 68 y.o. male with a past medical history of PAF, CAD s/p PTCA and DES to proximal LAD 2022>> PTCA and DES of ostial LAD 2024, hypertension, dyslipidemia.  11/21/2022 left heart cath stents remain widely patent, mild diffuse disease throughout left circumflex 07/29/2022 left heart cath s/p DES of the ostial LAD >> continue Plavix  indefinitely 07/28/2022 echo EF 60 to 65%, grade 1 DD 02/16/2021 left heart cath s/p DES to proximal LAD 02/16/2021 echo EF 55 to 60%, LA moderately dilated, trivial MR, mild aortic valve sclerosis present without stenosis 10/25/2019 left heart cath nonobstructive CAD, pLAD lesion 45% stenosed  Longstanding patient of [facility] dating back to 2020 for management of his atrial fibrillation.  In 2021 he underwent a left heart cath revealing nonobstructive CAD.  In 2022 he underwent a left heart cath requiring DES to his proximal LAD.  February 2024 he underwent left heart cath requiring DES to his proximal LAD overlapping previous stent.  In June 2024 he underwent left heart cath, stents were widely patent, mild diffuse disease through the left circumflex with recommendations for medical management.  Evaluated by Dr. Fitch on 01/23/2023, was doing well from a cardiac perspective, maintaining sinus rhythm, advised to continue Plavix  and Eliquis  indefinitely, 79-month follow-up.  Most recently evaluated by myself on 06/19/2023 for follow-up of his CAD and atrial fibrillation, no changes made to medications or plan of care and is advised to follow-up in 6 months.  He presents today with concerns of excessive sweating and chest pain at times.  He recently had a bunionectomy and has been  recovering from back, had some recurrent issues with swelling and was started on prednisone taper which he stopped 2 days ago.  He was concerned that his episodes of diaphoresis could be related to his coronary artery disease.  Regarding his episodes of chest pain, they are not consistent with prior episodes of angina that he had prior to his stenting in 2022 in 2024, pain is located under his left nipple, typically occurs while he is watching TV, lasting briefly.  He has been able to weed eat and mow his yard without any episodes of angina.  We did discuss the possibility there could be some small vessel disease, discussed cardiac PET versus increasing his Ranexa . He denies chest pain, palpitations, dyspnea, pnd, orthopnea, n, v, dizziness, syncope, edema, weight gain, or early satiety.    ROS: Review of Systems  Musculoskeletal:        Foot pain  All other systems reviewed and are negative.    Studies Reviewed: Richard   EKG Interpretation Date/Time:  Wednesday November 29 2023 15:15:20 EDT Ventricular Rate:  65 PR Interval:  166 QRS Duration:  84 QT Interval:  382 QTC Calculation: 397 R Axis:   -26  Text Interpretation: Normal sinus rhythm Minimal voltage criteria for LVH, may be normal variant ( R in aVL ) When compared with ECG of 30-Jul-2022 04:40, No significant change was found Confirmed by Carlin Nest [phone]) on 11/29/2023 3:19:14 PM    Cardiac Studies & Procedures   ______________________________________________________________________________________________ CARDIAC CATHETERIZATION  CARDIAC CATHETERIZATION 11/21/2022  Conclusion Single vessel CAD with wide patency of the recently implanted LAD  stents Continued patency of the left main, left circumflex, and RCA, with mild nonobstructive plaquing  Recommend: continue medical therapy  Findings Coronary Findings Diagnostic  Dominance: Right  Left Main Vessel is normal in caliber. Vessel is angiographically normal.  Left  Anterior Descending Non-stenotic Ost LAD to Prox LAD lesion was previously treated. Vessel is the culprit lesion. Ostial LAD stent remains widely patent Non-stenotic Prox LAD lesion was previously treated. The lesion is eccentric.  Left Circumflex Vessel is normal in caliber. There is mild diffuse disease throughout the vessel.  Right Coronary Artery Vessel is normal in caliber. The vessel exhibits minimal luminal irregularities. Dominant vessel, minimal nonobstructive disease noted, patent throughout  Intervention  No interventions have been documented.   CARDIAC CATHETERIZATION  CARDIAC CATHETERIZATION 07/29/2022  Conclusion   Proximal LAD stent widely patent.   Ost LAD lesion is 75% stenosed just before the proximal LAD stent which was significant by RFR at 0.86..   A drug-eluting stent was successfully placed using a STENT ONYX FRONTIER 3.5X08, postdilated to greater than 4 mm in diameter and optimized with intravascular ultrasound.   Post intervention, there is a 0% residual stenosis.   The left ventricular systolic function is normal.   LV end diastolic pressure is normal.   The left ventricular ejection fraction is 55-65% by visual estimate.   There is no aortic valve stenosis.   Severe left subclavian study which was worse than the right subclavian toward velocity encountered in the past.  Would not use the left radial artery for access.  Successful PCI of the ostial LAD with some stent hanging back into the left main.  Would continue Plavix  indefinitely given the location of his stents.  Restart Eliquis  tomorrow.  Aspirin  for one month.  Continue aggressive preventive therapy.  As noted above, would not use left radial artery for access in the future.  Results conveyed to the wife, Candis.  Findings Coronary Findings Diagnostic  Dominance: Right  Left Main Vessel is normal in caliber. Vessel is angiographically normal.  Left Anterior Descending Ost LAD lesion is 75%  stenosed. Vessel is the culprit lesion. Pressure wire/FFR was performed on the lesion. RFR 0.86 Non-stenotic Prox LAD lesion was previously treated. The lesion is eccentric.  Left Circumflex Vessel is normal in caliber. There is mild diffuse disease throughout the vessel.  Right Coronary Artery Vessel is normal in caliber. There is mild diffuse disease throughout the vessel.  Intervention  Ost LAD lesion Stent Lesion crossed with guidewire using a WIRE ASAHI PROWATER 180CM. Pre-stent angioplasty was not performed. A drug-eluting stent was successfully placed using a STENT ONYX FRONTIER 3.5X08. Stent strut is well apposed. Stent overlaps previously placed stent. Post-stent angioplasty was performed using a BALLN [place] EMERGE MR 4.0X8. Post-Intervention Lesion Assessment The intervention was successful. Pre-interventional TIMI flow is 3. Post-intervention TIMI flow is 3. No complications occurred at this lesion. Ultrasound (IVUS) was performed on the lesion post PCI using a CATH OPTICROSS HD. Stent well apposed. There is a 0% residual stenosis post intervention.   STRESS TESTS  MYOCARDIAL PERFUSION IMAGING 03/16/2021   ECHOCARDIOGRAM  ECHOCARDIOGRAM COMPLETE 07/28/2022  Narrative ECHOCARDIOGRAM REPORT    Patient Name:   Richard Spence Date of Exam: 07/28/2022 Medical Rec #:  [id number]        Height:       72.0 in Accession #:    [id number]       Weight:       230.0 lb Date of Birth:  08/25/1955        BSA:          2.261 m Patient Age:    66 years         BP:           124/66 mmHg Patient Gender: M                HR:           55 bpm. Exam Location:  Inpatient  Procedure: 2D Echo, Cardiac Doppler, Color Doppler and Intracardiac Opacification Agent  Indications:    Chest Pain R07.9  History:        Patient has prior history of Echocardiogram examinations, most recent 02/16/2021. Cardiomyopathy, CAD, Arrythmias:Atrial Fibrillation and Bradycardia, Signs/Symptoms:Dyspnea;  Risk Factors:Dyslipidemia.  Sonographer:    Thea Norlander Referring Phys: [id number] VASUNDHRA RATHORE   Sonographer Comments: Technically difficult study due to poor echo windows, no parasternal window, suboptimal apical window and no subcostal window. IMPRESSIONS   1. Left ventricular ejection fraction, by estimation, is 60 to 65%. The left ventricle has normal function. Left ventricular endocardial border not optimally defined to evaluate regional wall motion despite the use of Definity  contrast. Left ventricular diastolic parameters are consistent with Grade I diastolic dysfunction (impaired relaxation). 2. Right ventricular systolic function is normal. The right ventricular size is normal. 3. Left atrial size was mildly dilated. 4. The aortic valve was not well visualized. Aortic valve regurgitation is not visualized. No aortic stenosis is present. 5. The mitral valve was not well visualized. No evidence of mitral valve regurgitation.  FINDINGS Left Ventricle: Left ventricular ejection fraction, by estimation, is 60 to 65%. The left ventricle has normal function. Left ventricular endocardial border not optimally defined to evaluate regional wall motion. Definity  contrast agent was given IV to delineate the left ventricular endocardial borders. The left ventricular internal cavity size was normal in size. Suboptimal image quality limits for assessment of left ventricular hypertrophy. Left ventricular diastolic parameters are consistent with Grade I diastolic dysfunction (impaired relaxation).  Right Ventricle: The right ventricular size is normal. No increase in right ventricular wall thickness. Right ventricular systolic function is normal.  Left Atrium: Left atrial size was mildly dilated.  Right Atrium: Right atrial size was normal in size.  Pericardium: The pericardium was not well visualized.  Mitral Valve: The mitral valve was not well visualized. No evidence of mitral valve  regurgitation.  Tricuspid Valve: The tricuspid valve is grossly normal. Tricuspid valve regurgitation is not demonstrated.  Aortic Valve: The aortic valve was not well visualized. Aortic valve regurgitation is not visualized. No aortic stenosis is present. Aortic valve mean gradient measures 4.0 mmHg. Aortic valve peak gradient measures 8.8 mmHg.  Pulmonic Valve: The pulmonic valve was not assessed.  Aorta: The aortic root was not well visualized.  Venous: The inferior vena cava was not well visualized.  IAS/Shunts: The interatrial septum was not assessed.    Diastology LV e' medial:    7.30 cm/s LV E/e' medial:  8.7 LV e' lateral:   10.90 cm/s LV E/e' lateral: 5.8   RIGHT VENTRICLE RV S prime:     11.30 cm/s TAPSE (M-mode): 1.5 cm  LEFT ATRIUM           Index LA Vol (A4C): 57.1 ml 25.25 ml/m AORTIC VALVE AV Vmax:           148.00 cm/s AV Vmean:          95.400 cm/s AV VTI:  0.304 m AV Peak Grad:      8.8 mmHg AV Mean Grad:      4.0 mmHg LVOT Vmax:         128.00 cm/s LVOT Vmean:        79.700 cm/s LVOT VTI:          0.261 m LVOT/AV VTI ratio: 0.86  MITRAL VALVE MV Area (PHT): 3.37 cm    SHUNTS MV Decel Time: 225 msec    Systemic VTI: 0.26 m MV E velocity: 63.60 cm/s MV A velocity: 85.50 cm/s MV E/A ratio:  0.74  Soyla Merck MD Electronically signed by Soyla Merck MD Signature Date/Time: 07/28/2022/8:40:16 PM    Final      CT SCANS  CT CORONARY FRACTIONAL FLOW RESERVE DATA PREP 02/15/2021  Narrative EXAM: FFRCT ANALYSIS  FINDINGS: FFRct analysis was performed on the original cardiac CT angiogram dataset. Diagrammatic representation of the FFRct analysis is provided in a separate PDF document in PACS. This dictation was created using the PDF document and an interactive 3D model of the results. 3D model is not available in the EMR/PACS. Normal FFR range is >0.80.  1. Left Main: 0.98 2. LAD: Prox 0.96, distal to prox LAD lesion  0.64, distal LAD 0.59 3. LCX: Prox 0.97, mid 0.97, distal 0.96 4. RCA: Prox 0.98, mid 0.97, distal 0.96  IMPRESSION: Hemodynamically significant proximal LAD stenosis noted with FFR 0.64  Lamar JINNY Fitch, MD   Electronically Signed By: Lamar Fitch M.D. On: 02/15/2021 13:58   CT SCANS  CT CORONARY MORPH W/CTA COR W/SCORE 02/15/2021  Addendum 02/15/2021 12:56 PM ADDENDUM REPORT: 02/15/2021 12:54  CLINICAL DATA:  CP with normal stress test  EXAM: Cardiac/Coronary  CTA  TECHNIQUE: The patient was scanned on a [employer].  FINDINGS: A 120 kV prospective scan was triggered in the descending thoracic aorta at 111 HU's. Axial non-contrast 3 mm slices were carried out through the heart. The data set was analyzed on a dedicated work station and scored using the Agatson method. Gantry rotation speed was 250 msecs and collimation was .6 mm. No beta blockade and 0.8 mg of sl NTG was given. The 3D data set was reconstructed in 5% intervals of the 67-82 % of the R-R cycle. Diastolic phases were analyzed on a dedicated work station using MPR, MIP and VRT modes. The patient received 80 cc of contrast.  Aorta:  Normal size.  No calcifications.  No dissection.  Aortic Valve:  Trileaflet.  No calcifications.  Coronary Arteries:  Normal coronary origin.  Right dominance.  RCA is a large dominant artery that gives rise to PDA and PLA. In the mid portion of the artery calcified, minimal, non obstructive (0-25%) plaque is noted.  Left main is a large artery that gives rise to LAD and LCX arteries. In the distal portion of this artery small, calcified, non obstructive (0-25%) plaque is noted.  LAD is a large vessel. In the proximal portion of this artery complex plaque is present with intraplaque dye penetration. This plaque cause severe stenosis (70-79%). Small D1 is noted distally.  LCX is a non-dominant artery that trifurcates to moderate size OM1, moderate  size OM2 and CX. OM2 has minimal non obstructive (0-25%) calcified plaque.  Other findings:  Normal pulmonary vein drainage into the left atrium. Right mid pulmonary vein noted - normal variant.  Normal left atrial appendage without a thrombus.  Normal size of the pulmonary artery.  IMPRESSION: 1. Coronary calcium  score of 92.  2. Normal coronary origin with right dominance.  3. CAD-RADS 4 Severe stenosis. (70-99% or > 50% left main). Cardiac catheterization or CT FFR is recommended. Consider symptom-guided anti-ischemic pharmacotherapy as well as risk factor modification per guideline directed care.  Lamar JINNY Fitch, MD   Electronically Signed By: Lamar Fitch M.D. On: 02/15/2021 12:54  Narrative EXAM: OVER-READ INTERPRETATION  CT CHEST  The following report is an over-read performed by radiologist Dr. Franky Crease of [facility] on 02/15/2021. This over-read does not include interpretation of cardiac or coronary anatomy or pathology. The coronary CTA interpretation by the cardiologist is attached.  COMPARISON:  01/21/2021  FINDINGS: Vascular: Heart is normal size.  Aorta normal caliber.  Mediastinum/Nodes: No adenopathy. Small hiatal hernia again noted, unchanged.  Lungs/Pleura: No confluent opacities or effusions.  Upper Abdomen: Imaging into the upper abdomen demonstrates no acute findings.  Musculoskeletal: Chest wall soft tissues are unremarkable. No acute bony abnormality.  IMPRESSION: No acute extra cardiac abnormality.  Electronically Signed: By: Franky Crease M.D. On: 02/15/2021 09:55     ______________________________________________________________________________________________      Risk Assessment/Calculations:    CHA2DS2-VASc Score = 4   This indicates a 4.8% annual risk of stroke. The patient's score is based upon: CHF History: 1 HTN History: 1 Diabetes History: 0 Stroke History: 0 Vascular Disease History:  1 Age Score: 1 Gender Score: 0            Physical Exam:   VS:  BP 120/80   Pulse 65   Ht 6' (1.829 m)   Wt 233 lb (105.7 kg)   SpO2 96%   BMI 31.60 kg/m    Wt Readings from Last 3 Encounters:  11/29/23 233 lb (105.7 kg)  06/19/23 235 lb (106.6 kg)  01/23/23 228 lb (103.4 kg)    GEN: Well nourished, well developed in no acute distress NECK: No JVD; No carotid bruits CARDIAC: RRR, no murmurs, rubs, gallops RESPIRATORY:  Clear to auscultation without rales, wheezing or rhonchi  ABDOMEN: Soft, non-tender, non-distended EXTREMITIES:  + edema left foot; No deformity   ASSESSMENT AND PLAN: .   CAD -no recurrent episodes of chest pain that are concerning for angina.  We discussed possibility of small vessel disease however symptoms do not sound to be consistent with that either, we did discuss we could arrange for cardiac PET or increase in his Ranexa , for now, he would like to not make any changes and just keep his follow-up with Dr. Krasowski in 2 weeks to see if his symptoms subside now that he is off his Medrol  Dosepak.  Continue Plavix  75 mg daily, currently on Eliquis  5 mg twice daily, continue Ranexa  500 mg twice daily, continue Crestor  40 mg daily.  PAF/hypercoagulable state -he is maintaining sinus rhythm, CHA2DS2-VASc or 4, continue Eliquis  5 mg twice daily--no indication for dose reduction.  HFpEF -NYHA class I, euvolemic.   Dyslipidemia -most recent LDL was well-controlled on 09/30/2022, continue Crestor  40 mg daily.         Dispo: Keep follow up with Dr. Krasowski.   Signed, Delon JAYSON Hoover, NP

## 2023-12-07 ENCOUNTER — Ambulatory Visit: Payer: Medicare (Managed Care)

## 2023-12-07 ENCOUNTER — Encounter: Payer: Self-pay | Admitting: Podiatry

## 2023-12-07 ENCOUNTER — Ambulatory Visit (INDEPENDENT_AMBULATORY_CARE_PROVIDER_SITE_OTHER): Payer: Medicare (Managed Care) | Admitting: Podiatry

## 2023-12-07 DIAGNOSIS — M2012 Hallux valgus (acquired), left foot: Secondary | ICD-10-CM

## 2023-12-07 DIAGNOSIS — M21962 Unspecified acquired deformity of left lower leg: Secondary | ICD-10-CM

## 2023-12-07 DIAGNOSIS — Z9889 Other specified postprocedural states: Secondary | ICD-10-CM

## 2023-12-07 NOTE — Progress Notes (Signed)
 He presents today date of surgery was August 18, 2023.  Status post [place] bunionectomy with screw fixation second metatarsal osteotomy with screw fixation and pinning of the second toe.  He denies fever chills nausea mobic muscle aches pain states that he has been doing his massage therapy at home continues to wear his compression anklet.  And seems to be doing a little bit better.  Objective: Vital signs are stable oriented x 3 edema has come down considerably probably about 50% at this point.  Secondary bone healing appears to have stabilized on radiographs today internal fixation has no change.  Assessment: Well-healing surgical foot remaining edema and scar tissue.  Plan: Encouraged him to continue to breakdown the scar tissue and milked the foot toward his leg.  He will continue wear the compression anklet during the daytime but not at night.  Follow-up with me in 6 weeks at his 1 make sure that he is doing well at that time.

## 2023-12-13 ENCOUNTER — Ambulatory Visit: Payer: Medicare (Managed Care) | Admitting: Cardiology

## 2023-12-26 ENCOUNTER — Ambulatory Visit: Payer: Medicare (Managed Care) | Attending: Cardiology | Admitting: Cardiology

## 2023-12-26 ENCOUNTER — Encounter: Payer: Self-pay | Admitting: Cardiology

## 2023-12-26 VITALS — BP 136/74 | HR 75 | Ht 72.0 in | Wt 231.2 lb

## 2023-12-26 DIAGNOSIS — I255 Ischemic cardiomyopathy: Secondary | ICD-10-CM

## 2023-12-26 DIAGNOSIS — R072 Precordial pain: Secondary | ICD-10-CM | POA: Diagnosis not present

## 2023-12-26 DIAGNOSIS — I2510 Atherosclerotic heart disease of native coronary artery without angina pectoris: Secondary | ICD-10-CM

## 2023-12-26 DIAGNOSIS — E782 Mixed hyperlipidemia: Secondary | ICD-10-CM

## 2023-12-26 DIAGNOSIS — Z9861 Coronary angioplasty status: Secondary | ICD-10-CM

## 2023-12-26 NOTE — Patient Instructions (Addendum)
Medication Instructions:  Your physician recommends that you continue on your current medications as directed. Please refer to the Current Medication list given to you today.  *If you need a refill on your cardiac medications before your next appointment, please call your pharmacy*   Lab Work: None Ordered If you have labs (blood work) drawn today and your tests are completely normal, you will receive your results only by: [place] (if you have MyChart) OR A paper copy in the mail If you have any lab test that is abnormal or we need to change your treatment, we will call you to review the results.   Testing/Procedures: Your physician has requested that you have a lexiscan myoview. For further information please visit [email or web address]. Please follow instruction sheet, as given.  The test will take approximately 3 to 4 hours to complete; you may bring reading material.  If someone comes with you to your appointment, they will need to remain in the main lobby due to limited space in the testing area.  How to prepare for your Myocardial Perfusion Test: Do not eat or drink 3 hours prior to your test, except you may have water. Do not consume products containing caffeine (regular or decaffeinated) 12 hours prior to your test. (ex: coffee, chocolate, sodas, tea). Do bring a list of your current medications with you.  If not listed below, you may take your medications as normal. Do wear comfortable clothes (no dresses or overalls) and walking shoes, tennis shoes preferred (No heels or open toe shoes are allowed). Do NOT wear cologne, perfume, aftershave, or lotions (deodorant is allowed). If these instructions are not followed, your test will have to be rescheduled.     Follow-Up: At [facility], you and your health needs are our priority.  As part of our continuing mission to provide you with exceptional heart care, we have created designated Provider Care Teams.  These Care Teams  include your primary Cardiologist (physician) and Advanced Practice Providers (APPs -  Physician Assistants and Nurse Practitioners) who all work together to provide you with the care you need, when you need it.  We recommend signing up for the patient portal called "MyChart".  Sign up information is provided on this After Visit Summary.  MyChart is used to connect with patients for Virtual Visits (Telemedicine).  Patients are able to view lab/test results, encounter notes, upcoming appointments, etc.  Non-urgent messages can be sent to your provider as well.   To learn more about what you can do with MyChart, go to [email or web address].    Your next appointment:   5 month(s)  The format for your next appointment:   In Person  Provider:   [name], MD    Other Instructions NA

## 2023-12-26 NOTE — Addendum Note (Signed)
 Addended by: ARLOA PLANAS D on: 12/26/2023 03:28 PM   Modules accepted: Orders

## 2023-12-26 NOTE — Progress Notes (Signed)
 Cardiology Office Note:    Date:  12/26/2023   ID:  Richard Spence, DOB 1955/10/28, MRN [id number]  PCP:  Richard Cathlyn LABOR., MD  Cardiologist:  Richard Fitch, MD    Referring MD: Richard Cathlyn LABOR., MD   Chief Complaint  Patient presents with   Chest Pain    History of Present Illness:    Richard Spence is a 68 y.o. male  with past medical history significant for paroxysmal atrial fibrillation, he is anticoagulated, PTCA and stenting of the ulcerated lesion in the proximal ID in September 2022 done in February 24 he required another PTCA and stenting of ostial LAD just before the proximal stent placed before. Additionally she include essential hypertension dyslipidemia.  Comes today to months for follow-up he has been complaining of having some chest pain pain happen at different time he could be sitting watching TV sometimes he walks pain is located on the left side but different compared to pain that he had before.  Otherwise he said he is doing fine  Past Medical History:  Diagnosis Date   Abnormal cardiac CT angiography    Abnormal stress test 10/21/2019   Acute pain of left knee 04/14/2020   Altered mental status 10/25/2018   Atrial fibrillation with rapid ventricular response (HCC) 10/08/2018   Atrial fibrillation with rapid ventricular response (HCC) 10/08/2018   CAD S/P percutaneous coronary angioplasty 12/02/2019   Formatting of this note might be different from the original.  10/2019. Cath at [facility] LAD 45%. EF 55-60%     Formatting of this note might be different from the original.  10/2019. Cath at [facility] LAD 45%. EF 55-60%.     Richard Spence.     Cardiomyopathy (HCC) 10/25/2018   EF 25-30% on ST. 40% on ECHO 2020.  LVH   Chest pain 02/15/2021   Chronic pain 04/27/2020   Chronic pain syndrome 04/27/2020   Coronary artery disease of native artery of native heart with stable angina pectoris (HCC) 12/02/2019   Formatting of this note might be different from the original. 10/2019.  Cath at [facility] LAD 45%. EF 55-60%   De Quervain's tenosynovitis 03/02/2022   Degeneration of lumbar intervertebral disc 07/30/2015   DOE (dyspnea on exertion) 10/16/2018   Epidermal inclusion cyst 10/12/2021   Erectile dysfunction 03/02/2022   Facet arthropathy, cervical 01/30/2020   Formatting of this note might be different from the original. Added automatically from request for surgery [id number]  Formatting of this note might be different from the original. Added automatically from request for surgery [id number]   Facial mass 12/15/2021   Formatting of this note might be different from the original. Cutaneous benign mixed tumor     Fatigue 06/06/2019   Hearing loss 10/15/2018   High risk medication use 07/30/2015   HLD (hyperlipidemia) 07/30/2015   Long-term use of aspirin  therapy 10/15/2018   Malaise and fatigue 10/25/2018   Mild cognitive impairment 12/10/2018   Formatting of this note might be different from the original. 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  Added automatically from request for surgery [id number]  Pain of cervical facet joint 01/30/2020   Formatting of this note might be different from the original. Added automatically from request for surgery [id number]   Paroxysmal atrial fibrillation (HCC) 05/06/2019   Persistent atrial fibrillation, status post cardioversion in July 2020 about flip back to atrial fibrillation a few weeks later 01/21/2019   Postlaminectomy syndrome of lumbar region 04/27/2020   Postural dizziness with presyncope 10/25/2018   Prediabetes 07/30/2015    Right shoulder injury 10/08/2018   S/P arthroscopy of right shoulder 03/12/2019   S/P rotator cuff repair 03/12/2019   SI (sacroiliac) pain 07/27/2020   Sinus bradycardia 07/28/2022   Tear of right supraspinatus tendon 10/25/2018   Tremor 07/30/2015   Unstable angina (HCC) 07/28/2022   Vitamin B 12 deficiency 07/30/2015   White matter disease, unspecified 11/19/2018   Noted on MRI of brain 2020. Discussed with pt and wife.    Past Surgical History:  Procedure Laterality Date   BACK SURGERY     CATARACT EXTRACTION, BILATERAL Bilateral 03/08/2021   03/22/2021   CHOLECYSTECTOMY     COLONOSCOPY  05/26/2016   Dr Richard Spence. Diminutive colon polyp (2)   CORONARY PRESSURE/FFR STUDY N/A 07/29/2022   Procedure: INTRAVASCULAR PRESSURE WIRE/FFR STUDY;  Surgeon: Richard Candyce RAMAN, MD;  Location: [facility] INVASIVE CV LAB;  Service: Cardiovascular;  Laterality: N/A;   CORONARY STENT INTERVENTION N/A 02/16/2021   Procedure: CORONARY STENT INTERVENTION;  Surgeon: Richard Candyce RAMAN, MD;  Location: [facility] INVASIVE CV LAB;  Service: Cardiovascular;  Laterality: N/A;   CORONARY STENT INTERVENTION N/A 07/29/2022   Procedure: CORONARY STENT INTERVENTION;  Surgeon: Richard Candyce RAMAN, MD;  Location: [facility] INVASIVE CV LAB;  Service: Cardiovascular;  Laterality: N/A;   CORONARY ULTRASOUND/IVUS N/A 02/16/2021   Procedure: Intravascular Ultrasound/IVUS;  Surgeon: Richard Candyce RAMAN, MD;  Location: [facility] INVASIVE CV LAB;  Service: Cardiovascular;  Laterality: N/A;   CORONARY ULTRASOUND/IVUS N/A 07/29/2022   Procedure: Intravascular Ultrasound/IVUS;  Surgeon: Richard Candyce RAMAN, MD;  Location: [facility] INVASIVE CV LAB;  Service: Cardiovascular;  Laterality: N/A;   ESOPHAGOGASTRODUODENOSCOPY  11/04/2014   Dr Richard Spence. Irregular GE junction questionable signicance. Erosive gastritis. Gastric polyps. Hiatal hernia. No obvious esophageal stricture status post empiric esophageal dilatation to 54 French   HERNIA REPAIR  2009    LEFT HEART CATH AND CORONARY ANGIOGRAPHY N/A 10/25/2019   Procedure: LEFT HEART CATH AND CORONARY ANGIOGRAPHY;  Surgeon: [place], Peter M, MD;  Location: [facility] INVASIVE CV LAB;  Service: Cardiovascular;  Laterality: N/A;   LEFT HEART CATH AND CORONARY ANGIOGRAPHY N/A 02/16/2021   Procedure: LEFT HEART CATH AND CORONARY ANGIOGRAPHY;  Surgeon: Richard Candyce RAMAN, MD;  Location: [facility] INVASIVE CV LAB;  Service: Cardiovascular;  Laterality: N/A;   LEFT HEART CATH AND CORONARY ANGIOGRAPHY N/A 07/29/2022   Procedure: LEFT HEART CATH AND CORONARY ANGIOGRAPHY;  Surgeon: Richard Candyce RAMAN, MD;  Location: [facility] INVASIVE CV LAB;  Service: Cardiovascular;  Laterality: N/A;   LEFT HEART CATH AND CORONARY ANGIOGRAPHY N/A 11/21/2022   Procedure: LEFT HEART CATH AND CORONARY ANGIOGRAPHY;  Surgeon: Wonda Sharper, MD;  Location: [facility] INVASIVE CV LAB;  Service: Cardiovascular;  Laterality: N/A;   ROTATOR CUFF REPAIR  03/07/2019   SHOULDER SURGERY Left 12/2010   Rotater cuff   SPINAL FUSION  1998   Dr Ethridge   [facility] SURGERY  2001   Spinal hardware removal by Dr. Silva    Current Medications: Current Meds  Medication Sig   apixaban  (ELIQUIS ) 5 MG TABS tablet Take 1 tablet (5 mg total) by mouth  2 (two) times daily.   Cholecalciferol (VITAMIN D3) 125 MCG (5000 UT) CAPS Take 5,000 Units by mouth daily.    clopidogrel  (PLAVIX ) 75 MG tablet Take 1 tablet (75 mg total) by mouth daily.   cyclobenzaprine (FLEXERIL) 10 MG tablet Take 10 mg by mouth 2 (two) times daily as needed for muscle spasms.   levocetirizine (XYZAL) 5 MG tablet Take 5 mg by mouth every evening.   nitroGLYCERIN  (NITROSTAT ) 0.4 MG SL tablet Place 1 tablet (0.4 mg total) under the tongue every 5 (five) minutes as needed for chest pain.   Nutritional Supplements (JUICE PLUS FIBRE PO) Take 3 tablets by mouth daily.   PARoxetine  (PAXIL ) 40 MG tablet Take 40 mg by mouth daily.    pregabalin  (LYRICA ) 75 MG capsule Take 2 capsules (150 mg total) by mouth  daily. (Patient taking differently: Take 75 mg by mouth 2 (two) times daily.)   ranolazine  (RANEXA ) 500 MG 12 hr tablet Take 1 tablet (500 mg total) by mouth 2 (two) times daily.   rosuvastatin  (CRESTOR ) 40 MG tablet Take 1 tablet (40 mg total) by mouth daily.   vitamin B-12 (CYANOCOBALAMIN) 1000 MCG tablet Take 1,000 mcg by mouth daily.     Allergies:   Patient has no known allergies.   Social History   Socioeconomic History   Marital status: Married    Spouse name: Candis   Number of children: 1   Years of education: Not on file   Highest education level: Not on file  Occupational History   Occupation: Retired  Tobacco Use   Smoking status: Never   Smokeless tobacco: Never  Vaping Use   Vaping status: Never Used  Substance and Sexual Activity   Alcohol use: Not Currently    Comment: since March 2023   Drug use: Not Currently   Sexual activity: Not on file  Other Topics Concern   Not on file  Social History Narrative   Not on file   Social Drivers of Health   Financial Resource Strain: Low Risk  (02/23/2022)   Received from [facility]   Overall Financial Resource Strain (CARDIA)  Food Insecurity: Low Risk  (03/07/2023)   Received from [facility]   Hunger Vital Sign    Within the past 12 months, you worried that your food would run out before you got money to buy more: Never true    Within the past 12 months, the food you bought just didn't last and you didn't have money to get more. : Never true  Transportation Needs: No Transportation Needs (03/07/2023)   Received from [facility]    In the past 12 months, has lack of reliable transportation kept you from medical appointments, meetings, work or from getting things needed for daily living? : No  Physical Activity: Insufficiently Active (02/23/2022)   Received from [facility] visits prior to 08/06/2022., [facility]   Exercise Vital Sign    On average, how many days per week  do you engage in moderate to strenuous exercise (like a brisk walk)?: 3 days    On average, how many minutes do you engage in exercise at this level?: 30 min  Stress: No Stress Concern Present (02/23/2022)   Received from [facility] visits prior to 08/06/2022., [facility]   [employer] of Occupational Health - Occupational Stress Questionnaire    Feeling of Stress : Only a little  Social Connections: Socially Integrated (02/23/2022)  Received from [facility] visits prior to 08/06/2022., [facility]   Social Connection and Isolation Panel    In a typical week, how many times do you talk on the phone with family, friends, or neighbors?: More than three times a week    How often do you get together with friends or relatives?: Patient declined    How often do you attend church or religious services?: More than 4 times per year    Do you belong to any clubs or organizations such as church groups, unions, fraternal or athletic groups, or school groups?: Yes    How often do you attend meetings of the clubs or organizations you belong to?: More than 4 times per year    Are you married, widowed, divorced, separated, never married, or living with a partner?: Married     Family History: The patient's family history includes Breast cancer in his mother; Dementia in his maternal grandmother and mother; Hypertension in his father and mother; Multiple sclerosis in his son; Prostate cancer in his maternal grandfather. There is no history of Colon cancer, Rectal cancer, Stomach cancer, Liver cancer, Pancreatic cancer, or Esophageal cancer. ROS:   Please see the history of present illness.    All 14 point review of systems negative except as described per history of present illness  EKGs/Labs/Other Studies Reviewed:         Recent Labs: 06/19/2023: BUN 20; Creatinine, Ser 1.04; Hemoglobin 16.8; Platelets 226; Potassium 4.1; Sodium 142  Recent Lipid  Panel    Component Value Date/Time   CHOL 116 09/21/2022 0810   TRIG 77 09/21/2022 0810   HDL 61 09/21/2022 0810   CHOLHDL 1.9 09/21/2022 0810   CHOLHDL 2.3 02/16/2021 0317   VLDL 17 02/16/2021 0317   LDLCALC 39 09/21/2022 0810    Physical Exam:    VS:  BP 136/74 (BP Location: Right Arm, Patient Position: Sitting)   Pulse 75   Ht 6' (1.829 m)   Wt 231 lb 3.2 oz (104.9 kg)   SpO2 97%   BMI 31.36 kg/m     Wt Readings from Last 3 Encounters:  12/26/23 231 lb 3.2 oz (104.9 kg)  11/29/23 233 lb (105.7 kg)  06/19/23 235 lb (106.6 kg)     GEN:  Well nourished, well developed in no acute distress HEENT: Normal NECK: No JVD; No carotid bruits LYMPHATICS: No lymphadenopathy CARDIAC: RRR, no murmurs, no rubs, no gallops RESPIRATORY:  Clear to auscultation without rales, wheezing or rhonchi  ABDOMEN: Soft, non-tender, non-distended MUSCULOSKELETAL:  No edema; No deformity  SKIN: Warm and dry LOWER EXTREMITIES: no swelling NEUROLOGIC:  Alert and oriented x 3 PSYCHIATRIC:  Normal affect   ASSESSMENT:    1. Precordial pain   2. CAD S/P percutaneous coronary angioplasty   3. Ischemic cardiomyopathy   4. Mixed hyperlipidemia    PLAN:    In order of problems listed above:  Chest pain with his different compared to where he had before.  He was given ranolazine  500 twice daily but not much change.  I will schedule him to hav exercise Cardiolite. History of coronary artery disease quite advanced and this is reasonable aggressively.  Pursuing investigation. Dyslipidemia I did review his K PN which show me his LDL 39 HDL 61 continue present management   Medication Adjustments/Labs and Tests Ordered: Current medicines are reviewed at length with the patient today.  Concerns regarding medicines are outlined above.  Orders Placed This Encounter  Procedures  MYOCARDIAL PERFUSION IMAGING   Medication changes: No orders of the defined types were placed in this  encounter.   Signed, Richard DOROTHA Fitch, MD, [facility] [address] 12/26/2023 2:33 PM    [facility]

## 2024-01-04 NOTE — Addendum Note (Signed)
 Addended by: BERNIE CHARLESTON on: 01/04/2024 01:39 PM   Modules accepted: Orders

## 2024-01-09 ENCOUNTER — Telehealth: Payer: Self-pay | Admitting: *Deleted

## 2024-01-09 NOTE — Telephone Encounter (Signed)
 Per DPR gave wife instructions for MPI study.

## 2024-01-16 ENCOUNTER — Ambulatory Visit: Payer: Medicare (Managed Care) | Attending: Cardiology

## 2024-01-16 DIAGNOSIS — R072 Precordial pain: Secondary | ICD-10-CM

## 2024-01-16 LAB — MYOCARDIAL PERFUSION IMAGING
LV dias vol: 104 mL (ref 62–150)
LV sys vol: 37 mL (ref 4.2–5.8)
Nuc Stress EF: 64 %
Peak HR: 90 {beats}/min
Rest HR: 59 {beats}/min
Rest Nuclear Isotope Dose: 10.5 mCi
SDS: 3
SRS: 0
SSS: 3
Stress Nuclear Isotope Dose: 31.7 mCi
TID: 0.94

## 2024-01-16 MED ORDER — REGADENOSON 0.4 MG/5ML IV SOLN
0.4000 mg | Freq: Once | INTRAVENOUS | Status: AC
  Administered 2024-01-16 (×2): 0.4 mg via INTRAVENOUS

## 2024-01-16 MED ORDER — TECHNETIUM TC 99M TETROFOSMIN IV KIT
10.5000 | PACK | Freq: Once | INTRAVENOUS | Status: AC | PRN
  Administered 2024-01-16 (×2): 10.5 via INTRAVENOUS

## 2024-01-16 MED ORDER — TECHNETIUM TC 99M TETROFOSMIN IV KIT
31.7000 | PACK | Freq: Once | INTRAVENOUS | Status: AC | PRN
  Administered 2024-01-16 (×2): 31.7 via INTRAVENOUS

## 2024-01-18 ENCOUNTER — Ambulatory Visit (INDEPENDENT_AMBULATORY_CARE_PROVIDER_SITE_OTHER): Payer: Medicare (Managed Care) | Admitting: Podiatry

## 2024-01-18 ENCOUNTER — Encounter: Payer: Self-pay | Admitting: Podiatry

## 2024-01-18 ENCOUNTER — Ambulatory Visit (INDEPENDENT_AMBULATORY_CARE_PROVIDER_SITE_OTHER): Payer: Medicare (Managed Care)

## 2024-01-18 DIAGNOSIS — Z9889 Other specified postprocedural states: Secondary | ICD-10-CM | POA: Diagnosis not present

## 2024-01-18 DIAGNOSIS — M2012 Hallux valgus (acquired), left foot: Secondary | ICD-10-CM | POA: Diagnosis not present

## 2024-01-18 DIAGNOSIS — M21962 Unspecified acquired deformity of left lower leg: Secondary | ICD-10-CM

## 2024-01-18 NOTE — Progress Notes (Signed)
 He presents today for his postop visit date of surgery 08/18/2023 [facility] bunionectomy second metatarsal osteotomy with screw fixation and pinning of the second toe.  He denies fever chills nausea vomit muscle aches or pains states he has been to cardiology recently with A-fib.  Otherwise he states his foot is swells but is not painful.  Objective: Vital signs stable oriented x 3 there is no erythema just some moderate edema no cellulitis drainage or odor is got great range of motion of the first metatarsophalangeal joint.  Radiographs taken today demonstrate osseously mature foot with good bone fixation and no longer developing any excessive growth.  Appears to be doing quite well.  Assessment: Well-healing surgical foot.  Plan: Follow-up with me on a as needed basis.

## 2024-01-19 ENCOUNTER — Ambulatory Visit: Payer: Medicare (Managed Care) | Attending: Cardiology

## 2024-01-19 VITALS — BP 126/72 | HR 60 | Ht 72.0 in | Wt 232.2 lb

## 2024-01-19 DIAGNOSIS — I2510 Atherosclerotic heart disease of native coronary artery without angina pectoris: Secondary | ICD-10-CM

## 2024-01-22 ENCOUNTER — Ambulatory Visit: Payer: Self-pay | Admitting: Cardiology

## 2024-01-31 ENCOUNTER — Telehealth: Payer: Self-pay

## 2024-01-31 NOTE — Telephone Encounter (Signed)
 Left message on My Chart with stress test results per Dr. Tonja Fray note. Routed to PCP.

## 2024-02-06 ENCOUNTER — Other Ambulatory Visit (HOSPITAL_BASED_OUTPATIENT_CLINIC_OR_DEPARTMENT_OTHER): Payer: Self-pay

## 2024-03-31 ENCOUNTER — Other Ambulatory Visit: Payer: Self-pay | Admitting: Cardiology

## 2024-05-27 ENCOUNTER — Ambulatory Visit: Payer: Medicare (Managed Care) | Attending: Cardiology | Admitting: Cardiology

## 2024-05-27 ENCOUNTER — Encounter: Payer: Self-pay | Admitting: Cardiology

## 2024-05-27 VITALS — BP 124/78 | HR 64 | Ht 72.0 in | Wt 229.0 lb

## 2024-05-27 DIAGNOSIS — I2510 Atherosclerotic heart disease of native coronary artery without angina pectoris: Secondary | ICD-10-CM | POA: Diagnosis not present

## 2024-05-27 DIAGNOSIS — Z9861 Coronary angioplasty status: Secondary | ICD-10-CM

## 2024-05-27 DIAGNOSIS — E782 Mixed hyperlipidemia: Secondary | ICD-10-CM

## 2024-05-27 DIAGNOSIS — I480 Paroxysmal atrial fibrillation: Secondary | ICD-10-CM

## 2024-05-27 NOTE — Progress Notes (Signed)
 " Cardiology Office Note:    Date:  05/27/2024   ID:  Richard Spence, DOB 1956-05-14, MRN [id number]  PCP:  Thurmond Cathlyn LABOR., MD  Cardiologist:  Lamar Fitch, MD    Referring MD: Thurmond Cathlyn LABOR., MD   Chief Complaint  Patient presents with   Follow-up  Doing fine  History of Present Illness:    Richard Spence is a 68 y.o. male past medical history significant approximately fibrillation, he is anticoagulated denies having palpitation, PTCA and stenting of ulcerated lesion in the proximal LAD in September 2022, then in February 2024 he required another PTCA and stenting of ostial LAD just before proximal stent placed before, additional problem include essential hypertension, dyslipidemia.  Comes today to months for follow-up still complain of having some rare episode of chest pain those usually happen at rest at the same time he tells me that he can walk climb stairs and he walks on the regular basis about 3 times a day for about 15 minutes each time and he is much faster walking that his wife.  He denies have any chest pain tightness squeezing pressure burning chest during walking.  Also last time I did stress test which was negative for ischemia.  Past Medical History:  Diagnosis Date   Abnormal cardiac CT angiography    Abnormal stress test 10/21/2019   Acute pain of left knee 04/14/2020   Altered mental status 10/25/2018   Atrial fibrillation with rapid ventricular response (HCC) 10/08/2018   Atrial fibrillation with rapid ventricular response (HCC) 10/08/2018   CAD S/P percutaneous coronary angioplasty 12/02/2019   Formatting of this note might be different from the original.  10/2019. Cath at [facility] LAD 45%. EF 55-60%     Formatting of this note might be different from the original.  10/2019. Cath at [facility] LAD 45%. EF 55-60%.     [name].     Cardiomyopathy (HCC) 10/25/2018   EF 25-30% on ST. 40% on ECHO 2020.  LVH   Chest pain 02/15/2021   Chronic pain 04/27/2020   Chronic pain  syndrome 04/27/2020   Coronary artery disease of native artery of native heart with stable angina pectoris 12/02/2019   Formatting of this note might be different from the original. 10/2019. Cath at [facility] LAD 45%. EF 55-60%   De Quervain's tenosynovitis 03/02/2022   Degeneration of lumbar intervertebral disc 07/30/2015   DOE (dyspnea on exertion) 10/16/2018   Epidermal inclusion cyst 10/12/2021   Erectile dysfunction 03/02/2022   Facet arthropathy, cervical 01/30/2020   Formatting of this note might be different from the original. Added automatically from request for surgery [id number]  Formatting of this note might be different from the original. Added automatically from request for surgery [id number]   Facial mass 12/15/2021   Formatting of this note might be different from the original. Cutaneous benign mixed tumor     Fatigue 06/06/2019   Hearing loss 10/15/2018   High risk medication use 07/30/2015   HLD (hyperlipidemia) 07/30/2015   Long-term use of aspirin  therapy 10/15/2018   Malaise and fatigue 10/25/2018   Mild cognitive impairment 12/10/2018   Formatting of this note might be different from the original. 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  Added automatically from request for surgery [id number]     Pain of cervical facet joint 01/30/2020   Formatting of this note might be different from the original. Added automatically from request for surgery [id number]   Paroxysmal atrial fibrillation (HCC) 05/06/2019   Persistent atrial fibrillation, status post cardioversion in July 2020 about flip back to atrial  fibrillation a few weeks later 01/21/2019   Postlaminectomy syndrome of lumbar region 04/27/2020   Postural dizziness with presyncope 10/25/2018   Prediabetes 07/30/2015   Right shoulder injury 10/08/2018   S/P arthroscopy of right shoulder 03/12/2019   S/P rotator cuff repair 03/12/2019   SI (sacroiliac) pain 07/27/2020   Sinus bradycardia 07/28/2022   Tear of right supraspinatus tendon 10/25/2018   Tremor 07/30/2015   Unstable angina (HCC) 07/28/2022   Vitamin B 12 deficiency 07/30/2015   White matter disease, unspecified 11/19/2018   Noted on MRI of brain 2020. Discussed with pt and wife.    Past Surgical History:  Procedure Laterality Date   BACK SURGERY     CATARACT EXTRACTION, BILATERAL Bilateral 03/08/2021   03/22/2021   CHOLECYSTECTOMY     COLONOSCOPY  05/26/2016   Dr Larene. Diminutive colon polyp (2)   CORONARY PRESSURE/FFR STUDY N/A 07/29/2022   Procedure: INTRAVASCULAR PRESSURE WIRE/FFR STUDY;  Surgeon: Dann Candyce RAMAN, MD;  Location: [facility] INVASIVE CV LAB;  Service: Cardiovascular;  Laterality: N/A;   CORONARY STENT INTERVENTION N/A 02/16/2021   Procedure: CORONARY STENT INTERVENTION;  Surgeon: Dann Candyce RAMAN, MD;  Location: [facility] INVASIVE CV LAB;  Service: Cardiovascular;  Laterality: N/A;   CORONARY STENT INTERVENTION N/A 07/29/2022   Procedure: CORONARY STENT INTERVENTION;  Surgeon: Dann Candyce RAMAN, MD;  Location: [facility] INVASIVE CV LAB;  Service: Cardiovascular;  Laterality: N/A;   CORONARY ULTRASOUND/IVUS N/A 02/16/2021   Procedure: Intravascular Ultrasound/IVUS;  Surgeon: Dann Candyce RAMAN, MD;  Location: [facility] INVASIVE CV LAB;  Service: Cardiovascular;  Laterality: N/A;   CORONARY ULTRASOUND/IVUS N/A 07/29/2022   Procedure: Intravascular Ultrasound/IVUS;  Surgeon: Dann Candyce RAMAN, MD;  Location: [facility] INVASIVE CV LAB;  Service: Cardiovascular;  Laterality: N/A;   ESOPHAGOGASTRODUODENOSCOPY  11/04/2014   Dr Larene. Irregular GE junction  questionable signicance. Erosive gastritis. Gastric polyps. Hiatal hernia. No obvious esophageal stricture status post empiric esophageal dilatation to 54 French   HERNIA REPAIR  2009   LEFT HEART CATH AND CORONARY ANGIOGRAPHY N/A 10/25/2019   Procedure: LEFT HEART CATH AND CORONARY ANGIOGRAPHY;  Surgeon: Jordan, Peter M, MD;  Location: [facility] INVASIVE CV LAB;  Service: Cardiovascular;  Laterality: N/A;   LEFT HEART CATH AND CORONARY ANGIOGRAPHY N/A 02/16/2021   Procedure: LEFT HEART CATH AND CORONARY ANGIOGRAPHY;  Surgeon: Dann Candyce RAMAN, MD;  Location: [facility] INVASIVE CV LAB;  Service: Cardiovascular;  Laterality: N/A;   LEFT HEART CATH AND CORONARY ANGIOGRAPHY N/A 07/29/2022   Procedure: LEFT HEART CATH AND CORONARY ANGIOGRAPHY;  Surgeon: Dann Candyce RAMAN, MD;  Location: [facility] INVASIVE CV LAB;  Service: Cardiovascular;  Laterality: N/A;   LEFT HEART CATH AND CORONARY ANGIOGRAPHY N/A 11/21/2022   Procedure: LEFT HEART CATH AND CORONARY ANGIOGRAPHY;  Surgeon: Wonda Sharper, MD;  Location: [facility] INVASIVE CV LAB;  Service: Cardiovascular;  Laterality: N/A;   ROTATOR CUFF REPAIR  03/07/2019   SHOULDER SURGERY Left 12/2010   Rotater cuff   SPINAL FUSION  1998   Dr Ethridge   [facility] SURGERY  2001  Spinal hardware removal by Dr. Silva    Current Medications: Active Medications[1]   Allergies:   Patient has no known allergies.   Social History   Socioeconomic History   Marital status: Married    Spouse name: Candis   Number of children: 1   Years of education: Not on file   Highest education level: Not on file  Occupational History   Occupation: Retired  Tobacco Use   Smoking status: Never   Smokeless tobacco: Never  Vaping Use   Vaping status: Never Used  Substance and Sexual Activity   Alcohol use: Not Currently    Comment: since March 2023   Drug use: Not Currently   Sexual activity: Not on file  Other Topics Concern   Not on file  Social History Narrative   Not on file    Social Drivers of Health   Tobacco Use: Low Risk (05/27/2024)   Patient History    Smoking Tobacco Use: Never    Smokeless Tobacco Use: Never    Passive Exposure: Not on file  Financial Resource Strain: Low Risk (02/23/2022)   Received from [facility]   Overall Financial Resource Strain (CARDIA)    Difficulty of Paying Living Expenses: Not hard at all  Food Insecurity: Low Risk (03/07/2023)   Received from [facility]   Epic    Within the past 12 months, you worried that your food would run out before you got money to buy more: Never true    Within the past 12 months, the food you bought just didn't last and you didn't have money to get more. : Never true  Transportation Needs: No Transportation Needs (03/07/2023)   Received from [facility]    In the past 12 months, has lack of reliable transportation kept you from medical appointments, meetings, work or from getting things needed for daily living? : No  Physical Activity: Insufficiently Active (02/23/2022)   Received from [facility] visits prior to 08/06/2022., [facility]   Exercise Vital Sign    On average, how many days per week do you engage in moderate to strenuous exercise (like a brisk walk)?: 3 days    On average, how many minutes do you engage in exercise at this level?: 30 min  Stress: No Stress Concern Present (02/23/2022)   Received from [facility] visits prior to 08/06/2022., [facility]   [employer] of Occupational Health - Occupational Stress Questionnaire    Feeling of Stress : Only a little  Social Connections: Socially Integrated (02/23/2022)   Received from [facility] visits prior to 08/06/2022., [facility]   Social Connection and Isolation Panel    In a typical week, how many times do you talk on the phone with family, friends, or neighbors?: More than three times a week    How often do you get together with  friends or relatives?: Patient declined    How often do you attend church or religious services?: More than 4 times per year    Do you belong to any clubs or organizations such as church groups, unions, fraternal or athletic groups, or school groups?: Yes    How often do you attend meetings of the clubs or organizations you belong to?: More than 4 times per year    Are you married, widowed, divorced, separated, never married, or living with a partner?: Married  Depression (PHQ2-9): Not on file  Alcohol Screen: Not  on file  Housing: Low Risk (03/07/2023)   Received from [facility]   Epic    What is your living situation today?: I have a steady place to live    Think about the place you live. Do you have problems with any of the following? Choose all that apply:: None/None on this list  Utilities: Low Risk (03/07/2023)   Received from [facility]   Utilities    In the past 12 months has the electric, gas, oil, or water company threatened to shut off services in your home? : No  Health Literacy: Not on file     Family History: The patient's family history includes Breast cancer in his mother; Dementia in his maternal grandmother and mother; Hypertension in his father and mother; Multiple sclerosis in his son; Prostate cancer in his maternal grandfather. There is no history of Colon cancer, Rectal cancer, Stomach cancer, Liver cancer, Pancreatic cancer, or Esophageal cancer. ROS:   Please see the history of present illness.    All 14 point review of systems negative except as described per history of present illness  EKGs/Labs/Other Studies Reviewed:         Recent Labs: 06/19/2023: BUN 20; Creatinine, Ser 1.04; Hemoglobin 16.8; Platelets 226; Potassium 4.1; Sodium 142  Recent Lipid Panel    Component Value Date/Time   CHOL 116 09/21/2022 0810   TRIG 77 09/21/2022 0810   HDL 61 09/21/2022 0810   CHOLHDL 1.9 09/21/2022 0810   CHOLHDL 2.3 02/16/2021 0317   VLDL 17 02/16/2021 0317    LDLCALC 39 09/21/2022 0810    Physical Exam:    VS:  BP 124/78   Pulse 64   Ht 6' (1.829 m)   Wt 229 lb (103.9 kg)   SpO2 99%   BMI 31.06 kg/m     Wt Readings from Last 3 Encounters:  05/27/24 229 lb (103.9 kg)  01/19/24 232 lb 3.2 oz (105.3 kg)  01/16/24 231 lb (104.8 kg)     GEN:  Well nourished, well developed in no acute distress HEENT: Normal NECK: No JVD; No carotid bruits LYMPHATICS: No lymphadenopathy CARDIAC: RRR, no murmurs, no rubs, no gallops RESPIRATORY:  Clear to auscultation without rales, wheezing or rhonchi  ABDOMEN: Soft, non-tender, non-distended MUSCULOSKELETAL:  No edema; No deformity  SKIN: Warm and dry LOWER EXTREMITIES: no swelling NEUROLOGIC:  Alert and oriented x 3 PSYCHIATRIC:  Normal affect   ASSESSMENT:    1. CAD S/P percutaneous coronary angioplasty   2. Paroxysmal atrial fibrillation (HCC)   3. Mixed hyperlipidemia    PLAN:    In order of problems listed above:  Coronary disease.  Does have some symptoms which appears to be atypical happening at rest, the same time he is able to walk climb stairs and exercise on the regular basis with no difficulties.  Continue present management.  Because of high risk lesion he had before and the fact that he gets recurrences of lesion proximal to stent placed I will continue antiplatelets therapy. Paroxysmal atrial fibrillation denies have any palpitation, continue anticoagulation. Mixed dyslipidemia I did review blood work done by primary care physician LDL 57 HDL 50, continue present management   Medication Adjustments/Labs and Tests Ordered: Current medicines are reviewed at length with the patient today.  Concerns regarding medicines are outlined above.  No orders of the defined types were placed in this encounter.  Medication changes: No orders of the defined types were placed in this encounter.   Signed, Lekendrick Alpern J. Perfecto Purdy,  MD, [facility] 05/27/2024 1:39 PM    [facility]    [1]  Current Meds  Medication Sig   apixaban  (ELIQUIS ) 5 MG TABS tablet Take 1 tablet (5 mg total) by mouth 2 (two) times daily.   Cholecalciferol (VITAMIN D3) 125 MCG (5000 UT) CAPS Take 5,000 Units by mouth daily.    clopidogrel  (PLAVIX ) 75 MG tablet Take 1 tablet (75 mg total) by mouth daily.   cyclobenzaprine (FLEXERIL) 10 MG tablet Take 10 mg by mouth 2 (two) times daily as needed for muscle spasms.   levocetirizine (XYZAL) 5 MG tablet Take 5 mg by mouth every evening.   nitroGLYCERIN  (NITROSTAT ) 0.4 MG SL tablet Place 1 tablet (0.4 mg total) under the tongue every 5 (five) minutes as needed for chest pain.   Nutritional Supplements (JUICE PLUS FIBRE PO) Take 3 tablets by mouth daily.   PARoxetine  (PAXIL ) 40 MG tablet Take 40 mg by mouth daily.    pregabalin  (LYRICA ) 75 MG capsule Take 2 capsules (150 mg total) by mouth daily. (Patient taking differently: Take 75 mg by mouth 2 (two) times daily.)   ranolazine  (RANEXA ) 500 MG 12 hr tablet Take 1 tablet (500 mg total) by mouth 2 (two) times daily.   rosuvastatin  (CRESTOR ) 40 MG tablet Take 1 tablet (40 mg total) by mouth daily.   vitamin B-12 (CYANOCOBALAMIN) 1000 MCG tablet Take 1,000 mcg by mouth daily.   "

## 2024-05-27 NOTE — Patient Instructions (Signed)
